# Patient Record
Sex: Male | Born: 1935
Health system: Southern US, Community
[De-identification: ages and names within clinical notes are randomized; demographics above are authoritative.]

## PROBLEM LIST (undated history)

## (undated) DIAGNOSIS — I444 Left anterior fascicular block: Secondary | ICD-10-CM

## (undated) DIAGNOSIS — H409 Unspecified glaucoma: Secondary | ICD-10-CM

## (undated) DIAGNOSIS — I509 Heart failure, unspecified: Secondary | ICD-10-CM

## (undated) DIAGNOSIS — E785 Hyperlipidemia, unspecified: Secondary | ICD-10-CM

## (undated) DIAGNOSIS — I4510 Unspecified right bundle-branch block: Secondary | ICD-10-CM

## (undated) DIAGNOSIS — K219 Gastro-esophageal reflux disease without esophagitis: Secondary | ICD-10-CM

## (undated) DIAGNOSIS — J449 Chronic obstructive pulmonary disease, unspecified: Secondary | ICD-10-CM

## (undated) DIAGNOSIS — I4891 Unspecified atrial fibrillation: Secondary | ICD-10-CM

## (undated) DIAGNOSIS — E039 Hypothyroidism, unspecified: Secondary | ICD-10-CM

## (undated) DIAGNOSIS — I499 Cardiac arrhythmia, unspecified: Secondary | ICD-10-CM

## (undated) DIAGNOSIS — I10 Essential (primary) hypertension: Secondary | ICD-10-CM

## (undated) DIAGNOSIS — D472 Monoclonal gammopathy: Secondary | ICD-10-CM

## (undated) DIAGNOSIS — D649 Anemia, unspecified: Secondary | ICD-10-CM

## (undated) HISTORY — PX: OTHER SURGICAL HISTORY: SHX169

## (undated) HISTORY — DX: Heart failure, unspecified: I50.9

## (undated) HISTORY — PX: TONSILLECTOMY: SUR1361

---

## 2007-12-13 ENCOUNTER — Ambulatory Visit: Payer: Self-pay | Admitting: Family Medicine

## 2007-12-26 ENCOUNTER — Ambulatory Visit: Payer: Self-pay | Admitting: Family Medicine

## 2008-01-05 ENCOUNTER — Ambulatory Visit: Payer: Self-pay | Admitting: Internal Medicine

## 2008-01-15 ENCOUNTER — Ambulatory Visit: Payer: Self-pay | Admitting: Internal Medicine

## 2008-01-23 ENCOUNTER — Ambulatory Visit: Payer: Self-pay | Admitting: Urology

## 2008-01-23 ENCOUNTER — Other Ambulatory Visit: Payer: Self-pay

## 2008-02-06 ENCOUNTER — Ambulatory Visit: Payer: Self-pay | Admitting: Urology

## 2008-02-14 ENCOUNTER — Ambulatory Visit: Payer: Self-pay | Admitting: Internal Medicine

## 2008-03-16 ENCOUNTER — Ambulatory Visit: Payer: Self-pay | Admitting: Internal Medicine

## 2008-04-05 ENCOUNTER — Ambulatory Visit: Payer: Self-pay | Admitting: Urology

## 2008-04-16 ENCOUNTER — Ambulatory Visit: Payer: Self-pay | Admitting: Internal Medicine

## 2008-04-19 ENCOUNTER — Ambulatory Visit: Payer: Self-pay | Admitting: Internal Medicine

## 2008-05-16 ENCOUNTER — Ambulatory Visit: Payer: Self-pay | Admitting: Internal Medicine

## 2008-07-16 ENCOUNTER — Ambulatory Visit: Payer: Self-pay | Admitting: Internal Medicine

## 2008-07-24 ENCOUNTER — Ambulatory Visit: Payer: Self-pay | Admitting: Internal Medicine

## 2008-07-26 ENCOUNTER — Ambulatory Visit: Payer: Self-pay | Admitting: Urology

## 2008-08-16 ENCOUNTER — Ambulatory Visit: Payer: Self-pay | Admitting: Internal Medicine

## 2008-11-14 ENCOUNTER — Ambulatory Visit: Payer: Self-pay | Admitting: Internal Medicine

## 2008-11-21 ENCOUNTER — Ambulatory Visit: Payer: Self-pay | Admitting: Internal Medicine

## 2008-12-14 ENCOUNTER — Ambulatory Visit: Payer: Self-pay | Admitting: Internal Medicine

## 2009-02-03 ENCOUNTER — Ambulatory Visit: Payer: Self-pay | Admitting: Urology

## 2009-03-16 ENCOUNTER — Ambulatory Visit: Payer: Self-pay | Admitting: Internal Medicine

## 2009-03-26 ENCOUNTER — Ambulatory Visit: Payer: Self-pay | Admitting: Internal Medicine

## 2009-04-16 ENCOUNTER — Ambulatory Visit: Payer: Self-pay | Admitting: Internal Medicine

## 2009-09-16 ENCOUNTER — Ambulatory Visit: Payer: Self-pay | Admitting: Internal Medicine

## 2009-09-24 ENCOUNTER — Ambulatory Visit: Payer: Self-pay | Admitting: Internal Medicine

## 2009-10-14 ENCOUNTER — Ambulatory Visit: Payer: Self-pay | Admitting: Internal Medicine

## 2009-11-14 ENCOUNTER — Ambulatory Visit: Payer: Self-pay | Admitting: Internal Medicine

## 2010-02-11 ENCOUNTER — Ambulatory Visit: Payer: Self-pay | Admitting: Urology

## 2010-02-13 ENCOUNTER — Ambulatory Visit: Payer: Self-pay | Admitting: Internal Medicine

## 2010-03-05 ENCOUNTER — Ambulatory Visit: Payer: Self-pay | Admitting: Internal Medicine

## 2010-03-16 ENCOUNTER — Ambulatory Visit: Payer: Self-pay | Admitting: Internal Medicine

## 2010-09-08 ENCOUNTER — Ambulatory Visit: Payer: Self-pay | Admitting: Internal Medicine

## 2010-09-16 ENCOUNTER — Ambulatory Visit: Payer: Self-pay | Admitting: Internal Medicine

## 2010-10-15 ENCOUNTER — Ambulatory Visit: Payer: Self-pay | Admitting: Internal Medicine

## 2011-01-12 ENCOUNTER — Ambulatory Visit: Payer: Self-pay | Admitting: Cardiology

## 2011-01-26 ENCOUNTER — Ambulatory Visit: Payer: Self-pay | Admitting: Cardiology

## 2011-02-24 ENCOUNTER — Ambulatory Visit: Payer: Self-pay | Admitting: Urology

## 2011-03-09 ENCOUNTER — Ambulatory Visit: Payer: Self-pay | Admitting: Internal Medicine

## 2011-03-17 ENCOUNTER — Ambulatory Visit: Payer: Self-pay | Admitting: Internal Medicine

## 2011-09-30 ENCOUNTER — Ambulatory Visit: Payer: Self-pay | Admitting: Internal Medicine

## 2011-09-30 LAB — CBC CANCER CENTER
Eosinophil #: 1.1 x10 3/mm — ABNORMAL HIGH (ref 0.0–0.7)
Eosinophil %: 21.4 %
Lymphocyte #: 1.6 x10 3/mm (ref 1.0–3.6)
Lymphocyte %: 33.1 %
MCHC: 33.8 g/dL (ref 32.0–36.0)
MCV: 98 fL (ref 80–100)
Monocyte %: 8.6 %
Neutrophil %: 36.1 %
Platelet: 223 x10 3/mm (ref 150–440)
RBC: 3.93 10*6/uL — ABNORMAL LOW (ref 4.40–5.90)
RDW: 13.9 % (ref 11.5–14.5)
WBC: 4.9 x10 3/mm (ref 3.8–10.6)

## 2011-09-30 LAB — COMPREHENSIVE METABOLIC PANEL
Albumin: 3.8 g/dL (ref 3.4–5.0)
Anion Gap: 6 — ABNORMAL LOW (ref 7–16)
BUN: 13 mg/dL (ref 7–18)
Bilirubin,Total: 0.6 mg/dL (ref 0.2–1.0)
Co2: 33 mmol/L — ABNORMAL HIGH (ref 21–32)
Creatinine: 1.04 mg/dL (ref 0.60–1.30)
EGFR (African American): >60
Glucose: 92 mg/dL (ref 65–99)
Potassium: 4.3 mmol/L (ref 3.5–5.1)
SGOT(AST): 22 U/L (ref 15–37)
Sodium: 139 mmol/L (ref 136–145)
Total Protein: 8.3 g/dL — ABNORMAL HIGH (ref 6.4–8.2)

## 2011-10-04 LAB — PROT IMMUNOELECTROPHORES(ARMC)

## 2011-10-11 LAB — PROT IMMUNOELECT,UR-24HR

## 2011-10-15 ENCOUNTER — Ambulatory Visit: Payer: Self-pay | Admitting: Internal Medicine

## 2012-02-28 ENCOUNTER — Ambulatory Visit: Payer: Self-pay | Admitting: Urology

## 2012-04-13 ENCOUNTER — Ambulatory Visit: Payer: Self-pay | Admitting: Oncology

## 2012-04-13 LAB — BASIC METABOLIC PANEL
BUN: 16 mg/dL (ref 7–18)
Calcium, Total: 9 mg/dL (ref 8.5–10.1)
Chloride: 99 mmol/L (ref 98–107)
Co2: 32 mmol/L (ref 21–32)
Creatinine: 1.14 mg/dL (ref 0.60–1.30)
EGFR (African American): >60
Glucose: 132 mg/dL — ABNORMAL HIGH (ref 65–99)
Osmolality: 279 (ref 275–301)
Potassium: 4 mmol/L (ref 3.5–5.1)
Sodium: 138 mmol/L (ref 136–145)

## 2012-04-13 LAB — CBC CANCER CENTER
Basophil %: 0.7 %
HCT: 41.8 % (ref 40.0–52.0)
HGB: 13.7 g/dL (ref 13.0–18.0)
Lymphocyte %: 40.3 %
Monocyte #: 0.3 x10 3/mm (ref 0.2–1.0)
Monocyte %: 6.5 %
Neutrophil #: 1.6 x10 3/mm (ref 1.4–6.5)
RBC: 4.22 10*6/uL — ABNORMAL LOW (ref 4.40–5.90)
WBC: 4.4 x10 3/mm (ref 3.8–10.6)

## 2012-04-16 ENCOUNTER — Ambulatory Visit: Payer: Self-pay | Admitting: Oncology

## 2012-05-16 ENCOUNTER — Ambulatory Visit: Payer: Self-pay | Admitting: Oncology

## 2012-10-10 ENCOUNTER — Ambulatory Visit: Payer: Self-pay | Admitting: Oncology

## 2012-10-12 LAB — BASIC METABOLIC PANEL
Calcium, Total: 9.1 mg/dL (ref 8.5–10.1)
Creatinine: 1.08 mg/dL (ref 0.60–1.30)
Glucose: 120 mg/dL — ABNORMAL HIGH (ref 65–99)
Osmolality: 278 (ref 275–301)
Sodium: 139 mmol/L (ref 136–145)

## 2012-10-12 LAB — CBC CANCER CENTER
Basophil #: 0 x10 3/mm (ref 0.0–0.1)
Eosinophil %: 15.1 %
HCT: 40.7 % (ref 40.0–52.0)
HGB: 13.9 g/dL (ref 13.0–18.0)
MCH: 33 pg (ref 26.0–34.0)
MCHC: 34.1 g/dL (ref 32.0–36.0)
MCV: 97 fL (ref 80–100)
Monocyte #: 0.3 x10 3/mm (ref 0.2–1.0)
Monocyte %: 5.9 %
Platelet: 242 x10 3/mm (ref 150–440)
WBC: 5.1 x10 3/mm (ref 3.8–10.6)

## 2012-10-13 LAB — URINE IEP, RANDOM

## 2012-10-14 ENCOUNTER — Ambulatory Visit: Payer: Self-pay | Admitting: Oncology

## 2012-10-16 LAB — KAPPA/LAMBDA FREE LIGHT CHAINS (ARMC)

## 2012-10-16 LAB — PROT IMMUNOELECTROPHORES(ARMC)

## 2013-02-28 ENCOUNTER — Ambulatory Visit: Payer: Self-pay | Admitting: Urology

## 2013-04-12 ENCOUNTER — Ambulatory Visit: Payer: Self-pay | Admitting: Oncology

## 2013-04-12 LAB — CBC CANCER CENTER
Basophil #: 0.1 x10 3/mm (ref 0.0–0.1)
Eosinophil #: 0.7 x10 3/mm (ref 0.0–0.7)
HCT: 39.4 % — ABNORMAL LOW (ref 40.0–52.0)
HGB: 13.7 g/dL (ref 13.0–18.0)
MCH: 33.7 pg (ref 26.0–34.0)
MCHC: 34.8 g/dL (ref 32.0–36.0)
Monocyte %: 6.4 %
Neutrophil #: 1.7 x10 3/mm (ref 1.4–6.5)
Neutrophil %: 35.6 %
RBC: 4.07 10*6/uL — ABNORMAL LOW (ref 4.40–5.90)
RDW: 12.7 % (ref 11.5–14.5)

## 2013-04-12 LAB — BASIC METABOLIC PANEL
Anion Gap: 5 — ABNORMAL LOW (ref 7–16)
Calcium, Total: 9.4 mg/dL (ref 8.5–10.1)
Chloride: 101 mmol/L (ref 98–107)
Co2: 32 mmol/L (ref 21–32)
Creatinine: 1.14 mg/dL (ref 0.60–1.30)
EGFR (African American): >60
EGFR (Non-African Amer.): 59 — ABNORMAL LOW
Glucose: 102 mg/dL — ABNORMAL HIGH (ref 65–99)
Osmolality: 276 (ref 275–301)
Sodium: 138 mmol/L (ref 136–145)

## 2013-04-13 LAB — URINE IEP, RANDOM

## 2013-04-13 LAB — KAPPA/LAMBDA FREE LIGHT CHAINS (ARMC)

## 2013-04-13 LAB — PROT IMMUNOELECTROPHORES(ARMC)

## 2013-04-16 ENCOUNTER — Ambulatory Visit: Payer: Self-pay | Admitting: Oncology

## 2013-05-16 ENCOUNTER — Ambulatory Visit: Payer: Self-pay | Admitting: Oncology

## 2013-10-18 ENCOUNTER — Ambulatory Visit: Payer: Self-pay | Admitting: Oncology

## 2013-10-18 LAB — CBC CANCER CENTER
BASOS PCT: 0.3 %
Basophil #: 0 x10 3/mm (ref 0.0–0.1)
Eosinophil #: 0.9 x10 3/mm — ABNORMAL HIGH (ref 0.0–0.7)
Eosinophil %: 18.5 %
HCT: 42 % (ref 40.0–52.0)
HGB: 13.9 g/dL (ref 13.0–18.0)
Lymphocyte #: 1.9 x10 3/mm (ref 1.0–3.6)
Lymphocyte %: 40.5 %
MCH: 32.8 pg (ref 26.0–34.0)
MCHC: 33.2 g/dL (ref 32.0–36.0)
MCV: 99 fL (ref 80–100)
MONOS PCT: 6.7 %
Monocyte #: 0.3 x10 3/mm (ref 0.2–1.0)
NEUTROS ABS: 1.6 x10 3/mm (ref 1.4–6.5)
Neutrophil %: 34 %
PLATELETS: 192 x10 3/mm (ref 150–440)
RBC: 4.25 10*6/uL — ABNORMAL LOW (ref 4.40–5.90)
RDW: 12.4 % (ref 11.5–14.5)
WBC: 4.7 x10 3/mm (ref 3.8–10.6)

## 2013-10-18 LAB — BASIC METABOLIC PANEL
ANION GAP: 5 — AB (ref 7–16)
BUN: 17 mg/dL (ref 7–18)
CO2: 31 mmol/L (ref 21–32)
CREATININE: 1.04 mg/dL (ref 0.60–1.30)
Calcium, Total: 8.3 mg/dL — ABNORMAL LOW (ref 8.5–10.1)
Chloride: 102 mmol/L (ref 98–107)
EGFR (Non-African Amer.): >60
GLUCOSE: 104 mg/dL — AB (ref 65–99)
Osmolality: 278 (ref 275–301)
Potassium: 4.4 mmol/L (ref 3.5–5.1)
SODIUM: 138 mmol/L (ref 136–145)

## 2013-10-22 LAB — KAPPA/LAMBDA FREE LIGHT CHAINS (ARMC)

## 2013-10-22 LAB — URINE IEP, RANDOM

## 2013-10-22 LAB — PROT IMMUNOELECTROPHORES(ARMC)

## 2013-11-14 ENCOUNTER — Ambulatory Visit: Payer: Self-pay | Admitting: Oncology

## 2014-05-02 ENCOUNTER — Ambulatory Visit: Payer: Self-pay | Admitting: Oncology

## 2014-05-02 LAB — BASIC METABOLIC PANEL
Anion Gap: 8 (ref 7–16)
BUN: 17 mg/dL (ref 7–18)
Calcium, Total: 9.2 mg/dL (ref 8.5–10.1)
Chloride: 98 mmol/L (ref 98–107)
Co2: 31 mmol/L (ref 21–32)
Creatinine: 1.17 mg/dL (ref 0.60–1.30)
EGFR (African American): >60
EGFR (Non-African Amer.): 57 — ABNORMAL LOW
GLUCOSE: 108 mg/dL — AB (ref 65–99)
Osmolality: 276 (ref 275–301)
POTASSIUM: 4.2 mmol/L (ref 3.5–5.1)
SODIUM: 137 mmol/L (ref 136–145)

## 2014-05-02 LAB — CBC CANCER CENTER
Basophil #: 0 x10 3/mm (ref 0.0–0.1)
Basophil %: 0.8 %
EOS ABS: 0.6 x10 3/mm (ref 0.0–0.7)
EOS PCT: 14.6 %
HCT: 43.6 % (ref 40.0–52.0)
HGB: 14.7 g/dL (ref 13.0–18.0)
Lymphocyte #: 1.9 x10 3/mm (ref 1.0–3.6)
Lymphocyte %: 44.1 %
MCH: 33.9 pg (ref 26.0–34.0)
MCHC: 33.8 g/dL (ref 32.0–36.0)
MCV: 101 fL — AB (ref 80–100)
MONO ABS: 0.3 x10 3/mm (ref 0.2–1.0)
MONOS PCT: 7.4 %
NEUTROS PCT: 33.1 %
Neutrophil #: 1.4 x10 3/mm (ref 1.4–6.5)
Platelet: 202 x10 3/mm (ref 150–440)
RBC: 4.34 10*6/uL — ABNORMAL LOW (ref 4.40–5.90)
RDW: 12.4 % (ref 11.5–14.5)
WBC: 4.4 x10 3/mm (ref 3.8–10.6)

## 2014-05-04 LAB — URINE IEP, RANDOM

## 2014-05-07 LAB — PROT IMMUNOELECTROPHORES(ARMC)

## 2014-05-07 LAB — KAPPA/LAMBDA FREE LIGHT CHAINS (ARMC)

## 2014-05-16 ENCOUNTER — Ambulatory Visit: Payer: Self-pay | Admitting: Oncology

## 2014-08-19 DIAGNOSIS — J449 Chronic obstructive pulmonary disease, unspecified: Secondary | ICD-10-CM | POA: Diagnosis not present

## 2014-08-19 DIAGNOSIS — E782 Mixed hyperlipidemia: Secondary | ICD-10-CM | POA: Diagnosis not present

## 2014-08-19 DIAGNOSIS — I10 Essential (primary) hypertension: Secondary | ICD-10-CM | POA: Diagnosis not present

## 2014-08-19 DIAGNOSIS — D472 Monoclonal gammopathy: Secondary | ICD-10-CM | POA: Diagnosis not present

## 2014-09-04 DIAGNOSIS — I4510 Unspecified right bundle-branch block: Secondary | ICD-10-CM | POA: Diagnosis not present

## 2014-09-04 DIAGNOSIS — I10 Essential (primary) hypertension: Secondary | ICD-10-CM | POA: Diagnosis not present

## 2014-09-04 DIAGNOSIS — E119 Type 2 diabetes mellitus without complications: Secondary | ICD-10-CM | POA: Diagnosis not present

## 2014-09-04 DIAGNOSIS — E782 Mixed hyperlipidemia: Secondary | ICD-10-CM | POA: Diagnosis not present

## 2014-09-23 DIAGNOSIS — H4011X1 Primary open-angle glaucoma, mild stage: Secondary | ICD-10-CM | POA: Diagnosis not present

## 2014-11-07 ENCOUNTER — Ambulatory Visit
Admit: 2014-11-07 | Disposition: A | Discharge status: Home / Self Care | Payer: Self-pay | Attending: Oncology | Admitting: Oncology

## 2014-11-07 DIAGNOSIS — J449 Chronic obstructive pulmonary disease, unspecified: Secondary | ICD-10-CM | POA: Diagnosis not present

## 2014-11-07 DIAGNOSIS — K219 Gastro-esophageal reflux disease without esophagitis: Secondary | ICD-10-CM | POA: Diagnosis not present

## 2014-11-07 DIAGNOSIS — Z79899 Other long term (current) drug therapy: Secondary | ICD-10-CM | POA: Diagnosis not present

## 2014-11-07 DIAGNOSIS — N400 Enlarged prostate without lower urinary tract symptoms: Secondary | ICD-10-CM | POA: Diagnosis not present

## 2014-11-07 DIAGNOSIS — I10 Essential (primary) hypertension: Secondary | ICD-10-CM | POA: Diagnosis not present

## 2014-11-07 DIAGNOSIS — E785 Hyperlipidemia, unspecified: Secondary | ICD-10-CM | POA: Diagnosis not present

## 2014-11-07 DIAGNOSIS — D3002 Benign neoplasm of left kidney: Secondary | ICD-10-CM | POA: Diagnosis not present

## 2014-11-07 DIAGNOSIS — D472 Monoclonal gammopathy: Secondary | ICD-10-CM | POA: Diagnosis not present

## 2014-11-07 LAB — BASIC METABOLIC PANEL
ANION GAP: 6 — AB (ref 7–16)
BUN: 17 mg/dL
CO2: 31 mmol/L
Calcium, Total: 9.3 mg/dL
Chloride: 100 mmol/L — ABNORMAL LOW
Creatinine: 0.81 mg/dL
EGFR (African American): >60
EGFR (Non-African Amer.): >60
GLUCOSE: 97 mg/dL
Potassium: 4.4 mmol/L
SODIUM: 137 mmol/L

## 2014-11-07 LAB — CBC CANCER CENTER
BASOS ABS: 0.1 x10 3/mm (ref 0.0–0.1)
Basophil %: 2.7 %
EOS PCT: 10.9 %
Eosinophil #: 0.5 x10 3/mm (ref 0.0–0.7)
HCT: 42.2 % (ref 40.0–52.0)
HGB: 14.2 g/dL (ref 13.0–18.0)
LYMPHS ABS: 1.6 x10 3/mm (ref 1.0–3.6)
Lymphocyte %: 39 %
MCH: 33.8 pg (ref 26.0–34.0)
MCHC: 33.7 g/dL (ref 32.0–36.0)
MCV: 100 fL (ref 80–100)
MONO ABS: 0.3 x10 3/mm (ref 0.2–1.0)
Monocyte %: 8.2 %
Neutrophil #: 1.7 x10 3/mm (ref 1.4–6.5)
Neutrophil %: 39.2 %
Platelet: 188 x10 3/mm (ref 150–440)
RBC: 4.2 10*6/uL — ABNORMAL LOW (ref 4.40–5.90)
RDW: 12.3 % (ref 11.5–14.5)
WBC: 4.2 x10 3/mm (ref 3.8–10.6)

## 2014-11-11 LAB — PROT IMMUNOELECTROPHORES(ARMC)

## 2014-11-11 LAB — URINE IEP, RANDOM

## 2014-11-11 LAB — KAPPA/LAMBDA FREE LIGHT CHAINS (ARMC)

## 2014-11-14 DIAGNOSIS — D472 Monoclonal gammopathy: Secondary | ICD-10-CM | POA: Diagnosis not present

## 2014-11-14 DIAGNOSIS — Z79899 Other long term (current) drug therapy: Secondary | ICD-10-CM | POA: Diagnosis not present

## 2014-11-14 DIAGNOSIS — I10 Essential (primary) hypertension: Secondary | ICD-10-CM | POA: Diagnosis not present

## 2014-11-14 DIAGNOSIS — E785 Hyperlipidemia, unspecified: Secondary | ICD-10-CM | POA: Diagnosis not present

## 2014-11-14 DIAGNOSIS — K219 Gastro-esophageal reflux disease without esophagitis: Secondary | ICD-10-CM | POA: Diagnosis not present

## 2014-11-14 DIAGNOSIS — N400 Enlarged prostate without lower urinary tract symptoms: Secondary | ICD-10-CM | POA: Diagnosis not present

## 2014-11-14 DIAGNOSIS — D3002 Benign neoplasm of left kidney: Secondary | ICD-10-CM | POA: Diagnosis not present

## 2014-11-14 DIAGNOSIS — J449 Chronic obstructive pulmonary disease, unspecified: Secondary | ICD-10-CM | POA: Diagnosis not present

## 2014-11-15 ENCOUNTER — Ambulatory Visit
Admit: 2014-11-15 | Disposition: A | Discharge status: Home / Self Care | Payer: Self-pay | Attending: Oncology | Admitting: Oncology

## 2014-12-18 DIAGNOSIS — E782 Mixed hyperlipidemia: Secondary | ICD-10-CM | POA: Diagnosis not present

## 2014-12-18 DIAGNOSIS — I10 Essential (primary) hypertension: Secondary | ICD-10-CM | POA: Diagnosis not present

## 2014-12-18 DIAGNOSIS — E039 Hypothyroidism, unspecified: Secondary | ICD-10-CM | POA: Diagnosis not present

## 2014-12-18 DIAGNOSIS — D472 Monoclonal gammopathy: Secondary | ICD-10-CM | POA: Diagnosis not present

## 2015-01-22 DIAGNOSIS — H4011X1 Primary open-angle glaucoma, mild stage: Secondary | ICD-10-CM | POA: Diagnosis not present

## 2015-01-27 DIAGNOSIS — R0602 Shortness of breath: Secondary | ICD-10-CM | POA: Diagnosis not present

## 2015-01-27 DIAGNOSIS — J449 Chronic obstructive pulmonary disease, unspecified: Secondary | ICD-10-CM | POA: Diagnosis not present

## 2015-01-31 DIAGNOSIS — H4011X1 Primary open-angle glaucoma, mild stage: Secondary | ICD-10-CM | POA: Diagnosis not present

## 2015-03-14 DIAGNOSIS — I10 Essential (primary) hypertension: Secondary | ICD-10-CM | POA: Diagnosis not present

## 2015-03-14 DIAGNOSIS — I444 Left anterior fascicular block: Secondary | ICD-10-CM | POA: Diagnosis not present

## 2015-03-14 DIAGNOSIS — E782 Mixed hyperlipidemia: Secondary | ICD-10-CM | POA: Diagnosis not present

## 2015-03-14 DIAGNOSIS — I4510 Unspecified right bundle-branch block: Secondary | ICD-10-CM | POA: Diagnosis not present

## 2015-04-17 DIAGNOSIS — E039 Hypothyroidism, unspecified: Secondary | ICD-10-CM | POA: Diagnosis not present

## 2015-04-17 DIAGNOSIS — I10 Essential (primary) hypertension: Secondary | ICD-10-CM | POA: Diagnosis not present

## 2015-04-17 DIAGNOSIS — D638 Anemia in other chronic diseases classified elsewhere: Secondary | ICD-10-CM | POA: Diagnosis not present

## 2015-04-17 DIAGNOSIS — E782 Mixed hyperlipidemia: Secondary | ICD-10-CM | POA: Diagnosis not present

## 2015-04-22 DIAGNOSIS — D649 Anemia, unspecified: Secondary | ICD-10-CM | POA: Diagnosis not present

## 2015-04-22 DIAGNOSIS — D3002 Benign neoplasm of left kidney: Secondary | ICD-10-CM | POA: Diagnosis not present

## 2015-04-22 DIAGNOSIS — N400 Enlarged prostate without lower urinary tract symptoms: Secondary | ICD-10-CM | POA: Diagnosis not present

## 2015-04-22 DIAGNOSIS — I10 Essential (primary) hypertension: Secondary | ICD-10-CM | POA: Diagnosis not present

## 2015-04-22 DIAGNOSIS — E782 Mixed hyperlipidemia: Secondary | ICD-10-CM | POA: Diagnosis not present

## 2015-04-22 DIAGNOSIS — D472 Monoclonal gammopathy: Secondary | ICD-10-CM | POA: Diagnosis not present

## 2015-04-22 DIAGNOSIS — J449 Chronic obstructive pulmonary disease, unspecified: Secondary | ICD-10-CM | POA: Diagnosis not present

## 2015-05-07 ENCOUNTER — Other Ambulatory Visit: Payer: Self-pay | Admitting: *Deleted

## 2015-05-07 ENCOUNTER — Other Ambulatory Visit: Payer: Self-pay | Admitting: Oncology

## 2015-05-07 DIAGNOSIS — D472 Monoclonal gammopathy: Secondary | ICD-10-CM

## 2015-05-08 ENCOUNTER — Inpatient Hospital Stay: Payer: Commercial Managed Care - HMO | Attending: Oncology

## 2015-05-08 DIAGNOSIS — D3002 Benign neoplasm of left kidney: Secondary | ICD-10-CM | POA: Insufficient documentation

## 2015-05-08 DIAGNOSIS — D472 Monoclonal gammopathy: Secondary | ICD-10-CM | POA: Insufficient documentation

## 2015-05-08 LAB — BASIC METABOLIC PANEL
Anion gap: 7 (ref 5–15)
BUN: 19 mg/dL (ref 6–20)
CO2: 29 mmol/L (ref 22–32)
Calcium: 9.1 mg/dL (ref 8.9–10.3)
Chloride: 97 mmol/L — ABNORMAL LOW (ref 101–111)
Creatinine, Ser: 0.89 mg/dL (ref 0.61–1.24)
GFR calc Af Amer: >60 mL/min (ref >60)
GLUCOSE: 95 mg/dL (ref 65–99)
POTASSIUM: 4.3 mmol/L (ref 3.5–5.1)
Sodium: 133 mmol/L — ABNORMAL LOW (ref 135–145)

## 2015-05-08 LAB — CBC WITH DIFFERENTIAL/PLATELET
Basophils Absolute: 0 10*3/uL (ref 0–0.1)
Basophils Relative: 0 %
EOS PCT: 13 %
Eosinophils Absolute: 0.7 10*3/uL (ref 0–0.7)
HEMATOCRIT: 41.7 % (ref 40.0–52.0)
Hemoglobin: 14 g/dL (ref 13.0–18.0)
LYMPHS PCT: 38 %
Lymphs Abs: 1.9 10*3/uL (ref 1.0–3.6)
MCH: 32.9 pg (ref 26.0–34.0)
MCHC: 33.6 g/dL (ref 32.0–36.0)
MCV: 98 fL (ref 80.0–100.0)
MONO ABS: 0.4 10*3/uL (ref 0.2–1.0)
Monocytes Relative: 7 %
NEUTROS ABS: 2.1 10*3/uL (ref 1.4–6.5)
Neutrophils Relative %: 42 %
PLATELETS: 190 10*3/uL (ref 150–440)
RBC: 4.25 MIL/uL — ABNORMAL LOW (ref 4.40–5.90)
RDW: 13.1 % (ref 11.5–14.5)
WBC: 5.1 10*3/uL (ref 3.8–10.6)

## 2015-05-09 LAB — PROTEIN ELECTROPHORESIS, SERUM
A/G RATIO SPE: 0.9 (ref 0.7–1.7)
ALPHA-2-GLOBULIN: 0.9 g/dL (ref 0.4–1.0)
Albumin ELP: 3.6 g/dL (ref 2.9–4.4)
Alpha-1-Globulin: 0.2 g/dL (ref 0.0–0.4)
Beta Globulin: 1 g/dL (ref 0.7–1.3)
GLOBULIN, TOTAL: 3.9 g/dL (ref 2.2–3.9)
Gamma Globulin: 1.8 g/dL (ref 0.4–1.8)
M-Spike, %: 0.7 g/dL — ABNORMAL HIGH
TOTAL PROTEIN ELP: 7.5 g/dL (ref 6.0–8.5)

## 2015-05-15 ENCOUNTER — Inpatient Hospital Stay: Payer: Commercial Managed Care - HMO | Admitting: Oncology

## 2015-05-22 ENCOUNTER — Ambulatory Visit: Payer: Commercial Managed Care - HMO | Admitting: Oncology

## 2015-05-22 ENCOUNTER — Inpatient Hospital Stay: Payer: Commercial Managed Care - HMO | Attending: Oncology | Admitting: Oncology

## 2015-05-22 VITALS — BP 149/74 | HR 54 | Temp 97.4°F | Wt 195.3 lb

## 2015-05-22 DIAGNOSIS — I10 Essential (primary) hypertension: Secondary | ICD-10-CM | POA: Insufficient documentation

## 2015-05-22 DIAGNOSIS — J449 Chronic obstructive pulmonary disease, unspecified: Secondary | ICD-10-CM | POA: Diagnosis not present

## 2015-05-22 DIAGNOSIS — E785 Hyperlipidemia, unspecified: Secondary | ICD-10-CM | POA: Diagnosis not present

## 2015-05-22 DIAGNOSIS — D3002 Benign neoplasm of left kidney: Secondary | ICD-10-CM | POA: Diagnosis not present

## 2015-05-22 DIAGNOSIS — Z79899 Other long term (current) drug therapy: Secondary | ICD-10-CM

## 2015-05-22 DIAGNOSIS — K219 Gastro-esophageal reflux disease without esophagitis: Secondary | ICD-10-CM | POA: Insufficient documentation

## 2015-05-22 DIAGNOSIS — Z7982 Long term (current) use of aspirin: Secondary | ICD-10-CM | POA: Insufficient documentation

## 2015-05-22 DIAGNOSIS — D472 Monoclonal gammopathy: Secondary | ICD-10-CM | POA: Insufficient documentation

## 2015-05-22 NOTE — Progress Notes (Signed)
Patient has no concerns today. 

## 2015-06-06 NOTE — Progress Notes (Signed)
[facility]  [phone] [phone]  ID: Gabriel Delacruz OB: 12/23/1927  MR#: [id number]  CSN#:[id number]  Patient Care Team: Miriam K McLaughlin, MD as PCP - General (Physician Assistant)  CHIEF COMPLAINT:  Chief Complaint  Patient presents with  . Follow-up    INTERVAL HISTORY: Patient returns to clinic today for repeat laboratory work and further evaluation.  He continues to feel well and is asymptomatic. He denies any recent fevers or illnesses.  He denies any weakness or fatigue.  He denies any pain.  He has no neurologic complaints.  He has a good appetite and denies weight loss.  He has no chest pain or shortness of breath.  He denies any nausea, vomiting, constipation, or diarrhea.  He has no urinary complaints.  Patient offers no specific complaints today.    REVIEW OF SYSTEMS:   Review of Systems  Constitutional: Negative.   Respiratory: Negative.   Cardiovascular: Negative.   Musculoskeletal: Negative.   Neurological: Negative.     As per HPI. Otherwise, a complete review of systems is negatve.  PAST MEDICAL HISTORY: Glaucoma, hypertension, hyperlipidemia, BPH, perirectal abscess, COPD, GERD  PAST SURGICAL HISTORY: left renal oncocytoma s/p CT-guided percutaneous renal cryoablation, hypothyroidism.  Tonsillectomy, cervical and lumbar disc surgery.  FAMILY HISTORY: Reviewed and unchanged. No reported history of malignancy or chronic disease.      ADVANCED DIRECTIVES:    HEALTH MAINTENANCE: Social History  Substance Use Topics  . Smoking status: Not on file  . Smokeless tobacco: Not on file  . Alcohol Use: Not on file     Colonoscopy:  PAP:  Bone density:  Lipid panel:  Allergies not on file  Current Outpatient Prescriptions  Medication Sig Dispense Refill  . acetaminophen (TYLENOL) 325 MG tablet Take 650 mg by mouth.    . albuterol (PROAIR HFA) 108 (90 BASE) MCG/ACT inhaler Inhale into the lungs.    .  amLODipine (NORVASC) 5 MG tablet Take 5 mg by mouth.    . aspirin EC 81 MG tablet Take 81 mg by mouth.    . dorzolamide-timolol (COSOPT) 22.3-6.8 MG/ML ophthalmic solution 1 drop Two (2) times a day.    . Fluticasone-Salmeterol (ADVAIR DISKUS) 250-50 MCG/DOSE AEPB Inhale into the lungs.    . furosemide (LASIX) 20 MG tablet Take 20 mg by mouth.    . losartan-hydrochlorothiazide (HYZAAR) 100-12.5 MG tablet Take by mouth.    . multivitamin-iron-minerals-folic acid (CENTRUM) chewable tablet Chew by mouth.    . nebivolol (BYSTOLIC) 10 MG tablet Take 10 mg by mouth.    . potassium chloride (K-DUR) 10 MEQ tablet Take by mouth.    . Travoprost, BAK Free, (TRAVATAN Z) 0.004 % SOLN ophthalmic solution Apply to eye.     No current facility-administered medications for this visit.    OBJECTIVE: Filed Vitals:   05/22/15 1037  BP: 149/74  Pulse: 54  Temp: 97.4 F (36.3 C)     There is no height on file to calculate BMI.    ECOG FS:0 - Asymptomatic  General: Well-developed, well-nourished, no acute distress. Eyes: Pink conjunctiva, anicteric sclera. Lungs: Clear to auscultation bilaterally. Heart: Regular rate and rhythm. No rubs, murmurs, or gallops. Abdomen: Soft, nontender, nondistended. No organomegaly noted, normoactive bowel sounds. Musculoskeletal: No edema, cyanosis, or clubbing. Neuro: Alert, answering all questions appropriately. Cranial nerves grossly intact. Skin: No rashes or petechiae noted. Psych: Normal affect.   LAB RESULTS:  Lab Results  Component Value Date   NA 133* 05/08/2015     K 4.3 05/08/2015   CL 97* 05/08/2015   CO2 29 05/08/2015   GLUCOSE 95 05/08/2015   BUN 19 05/08/2015   CREATININE 0.89 05/08/2015   CALCIUM 9.1 05/08/2015   PROT 8.3* 09/30/2011   ALBUMIN 3.8 09/30/2011   AST 22 09/30/2011   ALT 31 09/30/2011   ALKPHOS 94 09/30/2011   BILITOT 0.6 09/30/2011   GFRNONAA >60 05/08/2015   GFRAA >60 05/08/2015    Lab Results  Component Value Date    WBC 5.1 05/08/2015   NEUTROABS 2.1 05/08/2015   HGB 14.0 05/08/2015   HCT 41.7 05/08/2015   MCV 98.0 05/08/2015   PLT 190 05/08/2015     STUDIES: No results found.  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  PLAN:    1. MGUS: Patient's M spike has been essentially unchanged at~ 0.7 for nearly 3 years, today's result is 0.7.  Bone marrow biopsy on October 23, 2009 revealed a plasma cell dyscrasia with 10% abnormal plasma cells.  Patient has no evidence of endorgan damage and is asymptomatic.  No intervention is needed at this time, continue simple observation.  Return to clinic in 6 months with repeat laboratory work and further evaluation.   2.  Left renal Oncocytoma: Followup and treatment per Dr. Jacqlyn Larsen 3.  Hypertension: Patient's blood pressure is elevated, but improved today. Continue current medications.  Patient expressed understanding and was in agreement with this plan. He also understands that He can call clinic at any time with any questions, concerns, or complaints.    Lloyd Huger, MD   06/06/2015 5:31 PM

## 2015-06-17 DIAGNOSIS — N281 Cyst of kidney, acquired: Secondary | ICD-10-CM | POA: Diagnosis not present

## 2015-06-17 DIAGNOSIS — N2889 Other specified disorders of kidney and ureter: Secondary | ICD-10-CM | POA: Diagnosis not present

## 2015-06-24 DIAGNOSIS — N200 Calculus of kidney: Secondary | ICD-10-CM | POA: Diagnosis not present

## 2015-06-24 DIAGNOSIS — N138 Other obstructive and reflux uropathy: Secondary | ICD-10-CM | POA: Diagnosis not present

## 2015-06-24 DIAGNOSIS — N2889 Other specified disorders of kidney and ureter: Secondary | ICD-10-CM | POA: Diagnosis not present

## 2015-06-24 DIAGNOSIS — N401 Enlarged prostate with lower urinary tract symptoms: Secondary | ICD-10-CM | POA: Diagnosis not present

## 2015-07-31 DIAGNOSIS — H401131 Primary open-angle glaucoma, bilateral, mild stage: Secondary | ICD-10-CM | POA: Diagnosis not present

## 2015-09-09 DIAGNOSIS — J449 Chronic obstructive pulmonary disease, unspecified: Secondary | ICD-10-CM | POA: Diagnosis not present

## 2015-09-09 DIAGNOSIS — D472 Monoclonal gammopathy: Secondary | ICD-10-CM | POA: Diagnosis not present

## 2015-09-09 DIAGNOSIS — I10 Essential (primary) hypertension: Secondary | ICD-10-CM | POA: Diagnosis not present

## 2015-09-09 DIAGNOSIS — E782 Mixed hyperlipidemia: Secondary | ICD-10-CM | POA: Diagnosis not present

## 2015-09-09 DIAGNOSIS — E038 Other specified hypothyroidism: Secondary | ICD-10-CM | POA: Diagnosis not present

## 2015-09-09 DIAGNOSIS — N400 Enlarged prostate without lower urinary tract symptoms: Secondary | ICD-10-CM | POA: Diagnosis not present

## 2015-09-16 DIAGNOSIS — E782 Mixed hyperlipidemia: Secondary | ICD-10-CM | POA: Diagnosis not present

## 2015-09-16 DIAGNOSIS — I10 Essential (primary) hypertension: Secondary | ICD-10-CM | POA: Diagnosis not present

## 2015-09-16 DIAGNOSIS — J45909 Unspecified asthma, uncomplicated: Secondary | ICD-10-CM | POA: Diagnosis not present

## 2015-09-16 DIAGNOSIS — I444 Left anterior fascicular block: Secondary | ICD-10-CM | POA: Diagnosis not present

## 2015-09-16 DIAGNOSIS — R001 Bradycardia, unspecified: Secondary | ICD-10-CM | POA: Diagnosis not present

## 2015-09-16 DIAGNOSIS — J449 Chronic obstructive pulmonary disease, unspecified: Secondary | ICD-10-CM | POA: Diagnosis not present

## 2015-09-16 DIAGNOSIS — I4510 Unspecified right bundle-branch block: Secondary | ICD-10-CM | POA: Diagnosis not present

## 2015-09-30 DIAGNOSIS — J449 Chronic obstructive pulmonary disease, unspecified: Secondary | ICD-10-CM | POA: Diagnosis not present

## 2015-11-20 ENCOUNTER — Inpatient Hospital Stay: Payer: Commercial Managed Care - HMO | Attending: Oncology

## 2015-12-01 ENCOUNTER — Inpatient Hospital Stay: Payer: Commercial Managed Care - HMO | Admitting: Oncology

## 2015-12-04 ENCOUNTER — Inpatient Hospital Stay: Payer: Commercial Managed Care - HMO | Attending: Oncology

## 2015-12-04 DIAGNOSIS — D472 Monoclonal gammopathy: Secondary | ICD-10-CM | POA: Diagnosis not present

## 2015-12-04 DIAGNOSIS — H409 Unspecified glaucoma: Secondary | ICD-10-CM | POA: Insufficient documentation

## 2015-12-04 DIAGNOSIS — N400 Enlarged prostate without lower urinary tract symptoms: Secondary | ICD-10-CM | POA: Diagnosis not present

## 2015-12-04 DIAGNOSIS — J449 Chronic obstructive pulmonary disease, unspecified: Secondary | ICD-10-CM | POA: Diagnosis not present

## 2015-12-04 DIAGNOSIS — I10 Essential (primary) hypertension: Secondary | ICD-10-CM | POA: Diagnosis not present

## 2015-12-04 DIAGNOSIS — Z79899 Other long term (current) drug therapy: Secondary | ICD-10-CM | POA: Diagnosis not present

## 2015-12-04 DIAGNOSIS — K219 Gastro-esophageal reflux disease without esophagitis: Secondary | ICD-10-CM | POA: Insufficient documentation

## 2015-12-04 DIAGNOSIS — E785 Hyperlipidemia, unspecified: Secondary | ICD-10-CM | POA: Insufficient documentation

## 2015-12-04 LAB — BASIC METABOLIC PANEL
Anion gap: 6 (ref 5–15)
BUN: 19 mg/dL (ref 6–20)
CALCIUM: 8.9 mg/dL (ref 8.9–10.3)
CO2: 28 mmol/L (ref 22–32)
Chloride: 101 mmol/L (ref 101–111)
Creatinine, Ser: 0.8 mg/dL (ref 0.61–1.24)
GFR calc non Af Amer: >60 mL/min (ref >60)
Glucose, Bld: 93 mg/dL (ref 65–99)
Potassium: 4.1 mmol/L (ref 3.5–5.1)
SODIUM: 135 mmol/L (ref 135–145)

## 2015-12-04 LAB — CBC
HCT: 40 % (ref 40.0–52.0)
Hemoglobin: 13.8 g/dL (ref 13.0–18.0)
MCH: 33.3 pg (ref 26.0–34.0)
MCHC: 34.4 g/dL (ref 32.0–36.0)
MCV: 96.8 fL (ref 80.0–100.0)
PLATELETS: 175 10*3/uL (ref 150–440)
RBC: 4.13 MIL/uL — ABNORMAL LOW (ref 4.40–5.90)
RDW: 12.4 % (ref 11.5–14.5)
WBC: 4.2 10*3/uL (ref 3.8–10.6)

## 2015-12-05 LAB — IMMUNOFIXATION ELECTROPHORESIS
IGG (IMMUNOGLOBIN G), SERUM: 1609 mg/dL — AB (ref 700–1600)
IgA: 224 mg/dL (ref 61–437)
IgM, Serum: 72 mg/dL (ref 15–143)
TOTAL PROTEIN ELP: 7.3 g/dL (ref 6.0–8.5)

## 2015-12-05 LAB — KAPPA/LAMBDA LIGHT CHAINS
KAPPA FREE LGHT CHN: 30.58 mg/L — AB (ref 3.30–19.40)
Kappa, lambda light chain ratio: 0.55 (ref 0.26–1.65)
Lambda free light chains: 55.5 mg/L — ABNORMAL HIGH (ref 5.71–26.30)

## 2015-12-05 LAB — PROTEIN ELECTRO, RANDOM URINE
ALBUMIN ELP UR: 23.4 %
ALPHA-2-GLOBULIN, U: 17.9 %
Alpha-1-Globulin, U: 2.6 %
BETA GLOBULIN, U: 19.6 %
Gamma Globulin, U: 36.5 %
M Component, Ur: 23.4 % — ABNORMAL HIGH
TOTAL PROTEIN, URINE-UPE24: 18.7 mg/dL

## 2015-12-05 LAB — PROTEIN ELECTROPHORESIS, SERUM
A/G Ratio: 1 (ref 0.7–1.7)
Albumin ELP: 3.6 g/dL (ref 2.9–4.4)
Alpha-1-Globulin: 0.2 g/dL (ref 0.0–0.4)
Alpha-2-Globulin: 0.9 g/dL (ref 0.4–1.0)
Beta Globulin: 1.1 g/dL (ref 0.7–1.3)
GLOBULIN, TOTAL: 3.7 g/dL (ref 2.2–3.9)
Gamma Globulin: 1.6 g/dL (ref 0.4–1.8)
M-Spike, %: 0.8 g/dL — ABNORMAL HIGH
TOTAL PROTEIN ELP: 7.3 g/dL (ref 6.0–8.5)

## 2015-12-11 ENCOUNTER — Inpatient Hospital Stay (HOSPITAL_BASED_OUTPATIENT_CLINIC_OR_DEPARTMENT_OTHER): Payer: Commercial Managed Care - HMO | Admitting: Oncology

## 2015-12-11 VITALS — BP 150/67 | HR 52 | Temp 97.5°F | Resp 16 | Wt 193.8 lb

## 2015-12-11 DIAGNOSIS — Z79899 Other long term (current) drug therapy: Secondary | ICD-10-CM

## 2015-12-11 DIAGNOSIS — J449 Chronic obstructive pulmonary disease, unspecified: Secondary | ICD-10-CM | POA: Diagnosis not present

## 2015-12-11 DIAGNOSIS — K219 Gastro-esophageal reflux disease without esophagitis: Secondary | ICD-10-CM

## 2015-12-11 DIAGNOSIS — I10 Essential (primary) hypertension: Secondary | ICD-10-CM

## 2015-12-11 DIAGNOSIS — H409 Unspecified glaucoma: Secondary | ICD-10-CM

## 2015-12-11 DIAGNOSIS — N400 Enlarged prostate without lower urinary tract symptoms: Secondary | ICD-10-CM

## 2015-12-11 DIAGNOSIS — E785 Hyperlipidemia, unspecified: Secondary | ICD-10-CM

## 2015-12-11 DIAGNOSIS — D472 Monoclonal gammopathy: Secondary | ICD-10-CM | POA: Diagnosis not present

## 2015-12-11 NOTE — Progress Notes (Signed)
Patient does not offer any problems today.  

## 2015-12-11 NOTE — Progress Notes (Signed)
[place]  [phone] [phone]  ID: Gabriel Delacruz OB: Aug 05, 1928  MR#: [id number]  DPO#:[id number]  Patient Care Team: Marinda Elk, MD as PCP - General (Physician Assistant)  CHIEF COMPLAINT:  Chief Complaint  Patient presents with  . MGUS    INTERVAL HISTORY: Patient returns to clinic today for repeat laboratory work and further evaluation.  He continues to feel well and is asymptomatic. He denies any recent fevers or illnesses.  He denies any weakness or fatigue.  He denies any pain.  He has no neurologic complaints.  He has a good appetite and denies weight loss.  He has no chest pain or shortness of breath.  He denies any nausea, vomiting, constipation, or diarrhea.  He has no urinary complaints.  Patient offers no specific complaints today.    REVIEW OF SYSTEMS:   Review of Systems  Constitutional: Negative.   Respiratory: Negative.   Cardiovascular: Negative.   Musculoskeletal: Negative.   Neurological: Negative.     As per HPI. Otherwise, a complete review of systems is negatve.  PAST MEDICAL HISTORY: Glaucoma, hypertension, hyperlipidemia, BPH, perirectal abscess, COPD, GERD  PAST SURGICAL HISTORY: left renal oncocytoma s/p CT-guided percutaneous renal cryoablation, hypothyroidism.  Tonsillectomy, cervical and lumbar disc surgery.  FAMILY HISTORY: Reviewed and unchanged. No reported history of malignancy or chronic disease.      ADVANCED DIRECTIVES:    HEALTH MAINTENANCE: Social History  Substance Use Topics  . Smoking status: Not on file  . Smokeless tobacco: Not on file  . Alcohol Use: Not on file    Not on File  Current Outpatient Prescriptions  Medication Sig Dispense Refill  . acetaminophen (TYLENOL) 325 MG tablet Take 650 mg by mouth.    Marland Kitchen albuterol (PROAIR HFA) 108 (90 BASE) MCG/ACT inhaler Inhale into the lungs.    Marland Kitchen amLODipine (NORVASC) 5 MG tablet Take 5 mg by mouth.    Marland Kitchen aspirin EC 81  MG tablet Take 81 mg by mouth.    . dorzolamide-timolol (COSOPT) 22.3-6.8 MG/ML ophthalmic solution 1 drop Two (2) times a day as needed    . Fluticasone-Salmeterol (ADVAIR DISKUS) 250-50 MCG/DOSE AEPB Inhale into the lungs.    . furosemide (LASIX) 20 MG tablet Take 20 mg by mouth.    . losartan-hydrochlorothiazide (HYZAAR) 100-12.5 MG tablet Take by mouth.    . multivitamin-iron-minerals-folic acid (CENTRUM) chewable tablet Chew by mouth.    . nebivolol (BYSTOLIC) 10 MG tablet Take 10 mg by mouth.    . potassium chloride (K-DUR) 10 MEQ tablet Take by mouth.    . Travoprost, BAK Free, (TRAVATAN Z) 0.004 % SOLN ophthalmic solution Apply to eye.     No current facility-administered medications for this visit.    OBJECTIVE: Filed Vitals:   12/11/15 1106  BP: 150/67  Pulse: 52  Temp: 97.5 F (36.4 C)  Resp: 16     There is no height on file to calculate BMI.    ECOG FS:0 - Asymptomatic  General: Well-developed, well-nourished, no acute distress. Eyes: Pink conjunctiva, anicteric sclera. Lungs: Clear to auscultation bilaterally. Heart: Regular rate and rhythm. No rubs, murmurs, or gallops. Abdomen: Soft, nontender, nondistended. No organomegaly noted, normoactive bowel sounds. Musculoskeletal: No edema, cyanosis, or clubbing. Neuro: Alert, answering all questions appropriately. Cranial nerves grossly intact. Skin: No rashes or petechiae noted. Psych: Normal affect.   LAB RESULTS:  Lab Results  Component Value Date   NA 135 12/04/2015   K 4.1 12/04/2015   CL  101 12/04/2015   CO2 28 12/04/2015   GLUCOSE 93 12/04/2015   BUN 19 12/04/2015   CREATININE 0.80 12/04/2015   CALCIUM 8.9 12/04/2015   PROT 8.3* 09/30/2011   ALBUMIN 3.8 09/30/2011   AST 22 09/30/2011   ALT 31 09/30/2011   ALKPHOS 94 09/30/2011   BILITOT 0.6 09/30/2011   GFRNONAA >60 12/04/2015   GFRAA >60 12/04/2015    Lab Results  Component Value Date   WBC 4.2 12/04/2015   NEUTROABS 2.1 05/08/2015   HGB  13.8 12/04/2015   HCT 40.0 12/04/2015   MCV 96.8 12/04/2015   PLT 175 12/04/2015     STUDIES: No results found.  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  PLAN:    1. MGUS: Patient's M spike has been essentially unchanged at~ 0.7 for nearly 3 years, today's result is 0.8.  Bone marrow biopsy on October 23, 2009 revealed a plasma cell dyscrasia with 10% abnormal plasma cells.  Patient has no evidence of endorgan damage and is asymptomatic.  No intervention is needed at this time, continue simple observation.  Return to clinic in one year with repeat laboratory work and further evaluation.   2. Hypertension: Patient's blood pressure is elevated, but improved today. Continue current medications.  Patient expressed understanding and was in agreement with this plan. He also understands that He can call clinic at any time with any questions, concerns, or complaints.    Mayra Reel, NP   12/11/2015 12:18 PM  Patient was seen and evaluated independently and I agree with the assessment and plan as dictated above.  Lloyd Huger, MD 12/12/2015 10:21 AM

## 2015-12-12 ENCOUNTER — Other Ambulatory Visit: Payer: Self-pay | Admitting: *Deleted

## 2016-01-07 DIAGNOSIS — M159 Polyosteoarthritis, unspecified: Secondary | ICD-10-CM | POA: Diagnosis not present

## 2016-01-07 DIAGNOSIS — I4510 Unspecified right bundle-branch block: Secondary | ICD-10-CM | POA: Diagnosis not present

## 2016-01-07 DIAGNOSIS — D3002 Benign neoplasm of left kidney: Secondary | ICD-10-CM | POA: Diagnosis not present

## 2016-01-07 DIAGNOSIS — E782 Mixed hyperlipidemia: Secondary | ICD-10-CM | POA: Diagnosis not present

## 2016-01-07 DIAGNOSIS — E039 Hypothyroidism, unspecified: Secondary | ICD-10-CM | POA: Diagnosis not present

## 2016-01-07 DIAGNOSIS — D472 Monoclonal gammopathy: Secondary | ICD-10-CM | POA: Diagnosis not present

## 2016-01-07 DIAGNOSIS — J449 Chronic obstructive pulmonary disease, unspecified: Secondary | ICD-10-CM | POA: Diagnosis not present

## 2016-01-07 DIAGNOSIS — I10 Essential (primary) hypertension: Secondary | ICD-10-CM | POA: Diagnosis not present

## 2016-02-05 DIAGNOSIS — H401131 Primary open-angle glaucoma, bilateral, mild stage: Secondary | ICD-10-CM | POA: Diagnosis not present

## 2016-03-15 DIAGNOSIS — J449 Chronic obstructive pulmonary disease, unspecified: Secondary | ICD-10-CM | POA: Diagnosis not present

## 2016-03-15 DIAGNOSIS — I10 Essential (primary) hypertension: Secondary | ICD-10-CM | POA: Diagnosis not present

## 2016-03-15 DIAGNOSIS — E782 Mixed hyperlipidemia: Secondary | ICD-10-CM | POA: Diagnosis not present

## 2016-05-03 DIAGNOSIS — I10 Essential (primary) hypertension: Secondary | ICD-10-CM | POA: Diagnosis not present

## 2016-05-03 DIAGNOSIS — E782 Mixed hyperlipidemia: Secondary | ICD-10-CM | POA: Diagnosis not present

## 2016-05-03 DIAGNOSIS — E039 Hypothyroidism, unspecified: Secondary | ICD-10-CM | POA: Diagnosis not present

## 2016-05-10 DIAGNOSIS — I4510 Unspecified right bundle-branch block: Secondary | ICD-10-CM | POA: Diagnosis not present

## 2016-05-10 DIAGNOSIS — E039 Hypothyroidism, unspecified: Secondary | ICD-10-CM | POA: Diagnosis not present

## 2016-05-10 DIAGNOSIS — E782 Mixed hyperlipidemia: Secondary | ICD-10-CM | POA: Diagnosis not present

## 2016-05-10 DIAGNOSIS — I10 Essential (primary) hypertension: Secondary | ICD-10-CM | POA: Diagnosis not present

## 2016-05-10 DIAGNOSIS — J449 Chronic obstructive pulmonary disease, unspecified: Secondary | ICD-10-CM | POA: Diagnosis not present

## 2016-05-10 DIAGNOSIS — Z23 Encounter for immunization: Secondary | ICD-10-CM | POA: Diagnosis not present

## 2016-05-10 DIAGNOSIS — D472 Monoclonal gammopathy: Secondary | ICD-10-CM | POA: Diagnosis not present

## 2016-06-02 DIAGNOSIS — J449 Chronic obstructive pulmonary disease, unspecified: Secondary | ICD-10-CM | POA: Diagnosis not present

## 2016-07-15 DIAGNOSIS — N138 Other obstructive and reflux uropathy: Secondary | ICD-10-CM | POA: Diagnosis not present

## 2016-07-15 DIAGNOSIS — N401 Enlarged prostate with lower urinary tract symptoms: Secondary | ICD-10-CM | POA: Diagnosis not present

## 2016-07-15 DIAGNOSIS — N281 Cyst of kidney, acquired: Secondary | ICD-10-CM | POA: Diagnosis not present

## 2016-07-15 DIAGNOSIS — N2889 Other specified disorders of kidney and ureter: Secondary | ICD-10-CM | POA: Diagnosis not present

## 2016-07-15 DIAGNOSIS — D3002 Benign neoplasm of left kidney: Secondary | ICD-10-CM | POA: Diagnosis not present

## 2016-07-15 DIAGNOSIS — N200 Calculus of kidney: Secondary | ICD-10-CM | POA: Diagnosis not present

## 2016-08-23 DIAGNOSIS — H401131 Primary open-angle glaucoma, bilateral, mild stage: Secondary | ICD-10-CM | POA: Diagnosis not present

## 2016-09-14 DIAGNOSIS — I10 Essential (primary) hypertension: Secondary | ICD-10-CM | POA: Diagnosis not present

## 2016-09-14 DIAGNOSIS — E039 Hypothyroidism, unspecified: Secondary | ICD-10-CM | POA: Diagnosis not present

## 2016-09-14 DIAGNOSIS — J449 Chronic obstructive pulmonary disease, unspecified: Secondary | ICD-10-CM | POA: Diagnosis not present

## 2016-10-30 ENCOUNTER — Emergency Department: Payer: Medicare HMO

## 2016-10-30 ENCOUNTER — Emergency Department
Admission: EM | Admit: 2016-10-30 | Discharge: 2016-10-30 | Disposition: A | Discharge status: Home / Self Care | Payer: Medicare HMO | Attending: Emergency Medicine | Admitting: Emergency Medicine

## 2016-10-30 ENCOUNTER — Encounter: Payer: Self-pay | Admitting: Emergency Medicine

## 2016-10-30 DIAGNOSIS — R0602 Shortness of breath: Secondary | ICD-10-CM | POA: Diagnosis not present

## 2016-10-30 DIAGNOSIS — J441 Chronic obstructive pulmonary disease with (acute) exacerbation: Secondary | ICD-10-CM | POA: Insufficient documentation

## 2016-10-30 DIAGNOSIS — Z7982 Long term (current) use of aspirin: Secondary | ICD-10-CM | POA: Insufficient documentation

## 2016-10-30 DIAGNOSIS — Z79899 Other long term (current) drug therapy: Secondary | ICD-10-CM | POA: Diagnosis not present

## 2016-10-30 DIAGNOSIS — I10 Essential (primary) hypertension: Secondary | ICD-10-CM | POA: Diagnosis not present

## 2016-10-30 DIAGNOSIS — J40 Bronchitis, not specified as acute or chronic: Secondary | ICD-10-CM | POA: Diagnosis not present

## 2016-10-30 HISTORY — DX: Essential (primary) hypertension: I10

## 2016-10-30 HISTORY — DX: Unspecified glaucoma: H40.9

## 2016-10-30 LAB — CBC
HEMATOCRIT: 38.2 % — AB (ref 40.0–52.0)
HEMOGLOBIN: 13.4 g/dL (ref 13.0–18.0)
MCH: 34.1 pg — ABNORMAL HIGH (ref 26.0–34.0)
MCHC: 35.2 g/dL (ref 32.0–36.0)
MCV: 96.9 fL (ref 80.0–100.0)
Platelets: 170 10*3/uL (ref 150–440)
RBC: 3.94 MIL/uL — AB (ref 4.40–5.90)
RDW: 12.6 % (ref 11.5–14.5)
WBC: 5.7 10*3/uL (ref 3.8–10.6)

## 2016-10-30 LAB — BASIC METABOLIC PANEL
ANION GAP: 9 (ref 5–15)
BUN: 30 mg/dL — ABNORMAL HIGH (ref 6–20)
CHLORIDE: 100 mmol/L — AB (ref 101–111)
CO2: 25 mmol/L (ref 22–32)
Calcium: 9 mg/dL (ref 8.9–10.3)
Creatinine, Ser: 0.98 mg/dL (ref 0.61–1.24)
GFR calc Af Amer: >60 mL/min (ref >60)
Glucose, Bld: 128 mg/dL — ABNORMAL HIGH (ref 65–99)
POTASSIUM: 3.2 mmol/L — AB (ref 3.5–5.1)
SODIUM: 134 mmol/L — AB (ref 135–145)

## 2016-10-30 LAB — TROPONIN I: Troponin I: 0.03 ng/mL (ref <0.03)

## 2016-10-30 MED ORDER — IPRATROPIUM-ALBUTEROL 0.5-2.5 (3) MG/3ML IN SOLN
3.0000 mL | Freq: Once | RESPIRATORY_TRACT | Status: AC
  Administered 2016-10-30: 3 mL via RESPIRATORY_TRACT
  Filled 2016-10-30: qty 3

## 2016-10-30 MED ORDER — PREDNISONE 20 MG PO TABS
40.0000 mg | ORAL_TABLET | Freq: Once | ORAL | Status: AC
  Administered 2016-10-30: 40 mg via ORAL

## 2016-10-30 MED ORDER — PREDNISONE 20 MG PO TABS: 40.0000 mg | ORAL_TABLET | Freq: Every day | ORAL | 0 refills | Status: DC

## 2016-10-30 MED ORDER — PREDNISONE 20 MG PO TABS
ORAL_TABLET | ORAL | Status: AC
  Administered 2016-10-30: 40 mg via ORAL
  Filled 2016-10-30: qty 2

## 2016-10-30 NOTE — Discharge Instructions (Signed)
Please seek medical attention for any high fevers, chest pain, shortness of breath, change in behavior, persistent vomiting, bloody stool or any other new or concerning symptoms.  

## 2016-10-30 NOTE — ED Provider Notes (Signed)
[facility] Emergency Department Provider Note  ____________________________________________   I have reviewed the triage vital signs and the nursing notes.   HISTORY  Chief Complaint Shortness of Breath   History limited by: Not Limited   HPI Gabriel Delacruz is a 83 y.o. male who presents to the emergency department today because of concerns for continued chest congestion. The patient states that the symptoms have been going on for roughly 1 week. He was prescribed a Z-Pak by his primary care doctor. He states he finished it yesterday but does not feel any better. He denies any pain in his chest. He denies any fevers. Has not noticed any swelling of his arms or legs.   Past Medical History:  Diagnosis Date  . Glaucoma   . Hypertension     Patient Active Problem List   Diagnosis Date Noted  . MGUS (monoclonal gammopathy of unknown significance) 12/11/2015    History reviewed. No pertinent surgical history.  Prior to Admission medications   Medication Sig Start Date End Date Taking? Authorizing Provider  acetaminophen (TYLENOL) 325 MG tablet Take 650 mg by mouth every 6 (six) hours as needed.    Yes Historical Provider, MD  albuterol (PROAIR HFA) 108 (90 BASE) MCG/ACT inhaler Inhale 2 puffs into the lungs every 6 (six) hours as needed.    Yes Historical Provider, MD  amLODipine (NORVASC) 5 MG tablet Take 5 mg by mouth daily.     Historical Provider, MD  aspirin EC 81 MG tablet Take 81 mg by mouth daily.     Historical Provider, MD  dorzolamide-timolol (COSOPT) 22.3-6.8 MG/ML ophthalmic solution 1 drop Two (2) times a day as needed    Historical Provider, MD  Fluticasone-Salmeterol (ADVAIR DISKUS) 250-50 MCG/DOSE AEPB Inhale 1 puff into the lungs 2 (two) times daily.     Historical Provider, MD  furosemide (LASIX) 20 MG tablet Take 20 mg by mouth daily.     Historical Provider, MD  losartan-hydrochlorothiazide (HYZAAR) 100-12.5 MG tablet Take 1  tablet by mouth daily.     Historical Provider, MD  multivitamin-iron-minerals-folic acid (CENTRUM) chewable tablet Chew 1 tablet by mouth daily.     Historical Provider, MD  nebivolol (BYSTOLIC) 10 MG tablet Take 10 mg by mouth daily.     Historical Provider, MD  potassium chloride (K-DUR) 10 MEQ tablet Take 10 mEq by mouth daily.     Historical Provider, MD  Travoprost, BAK Free, (TRAVATAN Z) 0.004 % SOLN ophthalmic solution Place 2 drops into both eyes at bedtime.     Historical Provider, MD    Allergies Patient has no known allergies.  No family history on file.  Social History Social History  Substance Use Topics  . Smoking status: Never Smoker  . Smokeless tobacco: Never Used  . Alcohol use Not on file    Review of Systems  Constitutional: Negative for fever. Cardiovascular: Negative for chest pain. Respiratory: Positive for shortness of breath. Gastrointestinal: Negative for abdominal pain, vomiting and diarrhea. Neurological: Negative for headaches, focal weakness or numbness.  10-point ROS otherwise negative.  ____________________________________________   PHYSICAL EXAM:  VITAL SIGNS: ED Triage Vitals [10/30/16 1919]  Enc Vitals Group     BP (!) 146/83     Pulse Rate 67     Resp 18     Temp 98.1 F (36.7 C)     Temp Source Oral     SpO2 97 %     Weight 198 lb (89.8 kg)  Height 5\' 8"  (1.727 m)   Constitutional: Alert and oriented.  Eyes: Conjunctivae are normal. Normal extraocular movements. ENT   Head: Normocephalic and atraumatic.   Nose: No congestion/rhinnorhea.   Mouth/Throat: Mucous membranes are moist.   Neck: No stridor. Hematological/Lymphatic/Immunilogical: No cervical lymphadenopathy. Cardiovascular: Normal rate, regular rhythm.  No murmurs, rubs, or gallops.  Respiratory: Slightly increased respiratory effort. Diffuse expiratory wheezing.  Gastrointestinal: Soft and non tender. No rebound. No guarding.  Genitourinary:  Deferred Musculoskeletal: Normal range of motion in all extremities. No lower extremity edema. Neurologic:  Normal speech and language. No gross focal neurologic deficits are appreciated.  Skin:  Skin is warm, dry and intact. No rash noted. Psychiatric: Mood and affect are normal. Speech and behavior are normal. Patient exhibits appropriate insight and judgment.  ____________________________________________    LABS (pertinent positives/negatives)  Labs Reviewed  BASIC METABOLIC PANEL - Abnormal; Notable for the following:       Result Value   Sodium 134 (*)    Potassium 3.2 (*)    Chloride 100 (*)    Glucose, Bld 128 (*)    BUN 30 (*)    All other components within normal limits  CBC - Abnormal; Notable for the following:    RBC 3.94 (*)    HCT 38.2 (*)    MCH 34.1 (*)    All other components within normal limits  TROPONIN I - Abnormal; Notable for the following:    Troponin I 0.03 (*)    All other components within normal limits    ____________________________________________   EKG  I, Nance Pear, attending physician, personally viewed and interpreted this EKG  EKG Time: 1926 Rate: 72 Rhythm: atrial fibrillation Axis: left axis deviation Intervals: qtc 467 QRS: RBBB, LAFB ST changes: no st elevation Impression: abnormal ekg   ____________________________________________    RADIOLOGY  CXR IMPRESSION:  1. No radiographic evidence of acute cardiopulmonary disease.  2. Cardiomegaly with evidence of left ventricular hypertrophy.  3. Aortic atherosclerosis.   ___________________________________________   PROCEDURES  Procedures  ____________________________________________   INITIAL IMPRESSION / ASSESSMENT AND PLAN / ED COURSE  Pertinent labs & imaging results that were available during my care of the patient were reviewed by me and considered in my medical decision making (see chart for details).  Patient presented to the emergency department  today with concerns for continued shortness breath and cough. Patient was recently treated with a Z-Pak. On exam patient did have some expiratory wheezing. Patient's chest x-ray without any concerning pneumonia. Patient did feel better and lungs were more open after DuoNeb treatments.  ____________________________________________   FINAL CLINICAL IMPRESSION(S) / ED DIAGNOSES  Final diagnoses:  COPD exacerbation ([place])  Bronchitis     Note: This dictation was prepared with Dragon dictation. Any transcriptional errors that result from this process are unintentional     Nance Pear, MD 10/30/16 2241

## 2016-10-30 NOTE — ED Triage Notes (Signed)
Patient states that he has a cold last week and was prescribed a z pack. Patient with increase shortness of breath. Patient with labored breathing and audible crackles.

## 2016-10-30 NOTE — ED Notes (Signed)
CRITICAL LAB: TROPONIN is 0.03, from [place], [place] Dr. Archie Balboa notified, orders received

## 2016-11-04 DIAGNOSIS — J440 Chronic obstructive pulmonary disease with acute lower respiratory infection: Secondary | ICD-10-CM | POA: Diagnosis not present

## 2016-11-11 DIAGNOSIS — J441 Chronic obstructive pulmonary disease with (acute) exacerbation: Secondary | ICD-10-CM | POA: Diagnosis not present

## 2016-11-11 DIAGNOSIS — J209 Acute bronchitis, unspecified: Secondary | ICD-10-CM | POA: Diagnosis not present

## 2016-11-15 DIAGNOSIS — J441 Chronic obstructive pulmonary disease with (acute) exacerbation: Secondary | ICD-10-CM | POA: Diagnosis not present

## 2016-11-15 DIAGNOSIS — J209 Acute bronchitis, unspecified: Secondary | ICD-10-CM | POA: Diagnosis not present

## 2016-11-15 DIAGNOSIS — D649 Anemia, unspecified: Secondary | ICD-10-CM | POA: Diagnosis not present

## 2016-11-15 DIAGNOSIS — J440 Chronic obstructive pulmonary disease with acute lower respiratory infection: Secondary | ICD-10-CM | POA: Diagnosis not present

## 2016-11-15 DIAGNOSIS — I10 Essential (primary) hypertension: Secondary | ICD-10-CM | POA: Diagnosis not present

## 2016-11-15 DIAGNOSIS — M25562 Pain in left knee: Secondary | ICD-10-CM | POA: Diagnosis not present

## 2016-11-15 DIAGNOSIS — I4510 Unspecified right bundle-branch block: Secondary | ICD-10-CM | POA: Diagnosis not present

## 2016-11-15 DIAGNOSIS — M1990 Unspecified osteoarthritis, unspecified site: Secondary | ICD-10-CM | POA: Diagnosis not present

## 2016-11-15 DIAGNOSIS — M109 Gout, unspecified: Secondary | ICD-10-CM | POA: Diagnosis not present

## 2016-11-18 DIAGNOSIS — J441 Chronic obstructive pulmonary disease with (acute) exacerbation: Secondary | ICD-10-CM | POA: Diagnosis not present

## 2016-11-18 DIAGNOSIS — D649 Anemia, unspecified: Secondary | ICD-10-CM | POA: Diagnosis not present

## 2016-11-18 DIAGNOSIS — M1990 Unspecified osteoarthritis, unspecified site: Secondary | ICD-10-CM | POA: Diagnosis not present

## 2016-11-18 DIAGNOSIS — J209 Acute bronchitis, unspecified: Secondary | ICD-10-CM | POA: Diagnosis not present

## 2016-11-18 DIAGNOSIS — M109 Gout, unspecified: Secondary | ICD-10-CM | POA: Diagnosis not present

## 2016-11-18 DIAGNOSIS — I10 Essential (primary) hypertension: Secondary | ICD-10-CM | POA: Diagnosis not present

## 2016-11-18 DIAGNOSIS — M25562 Pain in left knee: Secondary | ICD-10-CM | POA: Diagnosis not present

## 2016-11-18 DIAGNOSIS — J440 Chronic obstructive pulmonary disease with acute lower respiratory infection: Secondary | ICD-10-CM | POA: Diagnosis not present

## 2016-11-18 DIAGNOSIS — I4510 Unspecified right bundle-branch block: Secondary | ICD-10-CM | POA: Diagnosis not present

## 2016-11-22 DIAGNOSIS — J440 Chronic obstructive pulmonary disease with acute lower respiratory infection: Secondary | ICD-10-CM | POA: Diagnosis not present

## 2016-11-22 DIAGNOSIS — J441 Chronic obstructive pulmonary disease with (acute) exacerbation: Secondary | ICD-10-CM | POA: Diagnosis not present

## 2016-11-22 DIAGNOSIS — M1990 Unspecified osteoarthritis, unspecified site: Secondary | ICD-10-CM | POA: Diagnosis not present

## 2016-11-22 DIAGNOSIS — I4510 Unspecified right bundle-branch block: Secondary | ICD-10-CM | POA: Diagnosis not present

## 2016-11-22 DIAGNOSIS — M109 Gout, unspecified: Secondary | ICD-10-CM | POA: Diagnosis not present

## 2016-11-22 DIAGNOSIS — M25562 Pain in left knee: Secondary | ICD-10-CM | POA: Diagnosis not present

## 2016-11-22 DIAGNOSIS — I10 Essential (primary) hypertension: Secondary | ICD-10-CM | POA: Diagnosis not present

## 2016-11-22 DIAGNOSIS — D649 Anemia, unspecified: Secondary | ICD-10-CM | POA: Diagnosis not present

## 2016-11-22 DIAGNOSIS — J209 Acute bronchitis, unspecified: Secondary | ICD-10-CM | POA: Diagnosis not present

## 2016-11-24 DIAGNOSIS — M109 Gout, unspecified: Secondary | ICD-10-CM | POA: Diagnosis not present

## 2016-11-24 DIAGNOSIS — M25562 Pain in left knee: Secondary | ICD-10-CM | POA: Diagnosis not present

## 2016-11-24 DIAGNOSIS — I4510 Unspecified right bundle-branch block: Secondary | ICD-10-CM | POA: Diagnosis not present

## 2016-11-24 DIAGNOSIS — I10 Essential (primary) hypertension: Secondary | ICD-10-CM | POA: Diagnosis not present

## 2016-11-24 DIAGNOSIS — D649 Anemia, unspecified: Secondary | ICD-10-CM | POA: Diagnosis not present

## 2016-11-24 DIAGNOSIS — J209 Acute bronchitis, unspecified: Secondary | ICD-10-CM | POA: Diagnosis not present

## 2016-11-24 DIAGNOSIS — J440 Chronic obstructive pulmonary disease with acute lower respiratory infection: Secondary | ICD-10-CM | POA: Diagnosis not present

## 2016-11-24 DIAGNOSIS — J441 Chronic obstructive pulmonary disease with (acute) exacerbation: Secondary | ICD-10-CM | POA: Diagnosis not present

## 2016-11-24 DIAGNOSIS — M1990 Unspecified osteoarthritis, unspecified site: Secondary | ICD-10-CM | POA: Diagnosis not present

## 2016-11-26 DIAGNOSIS — I10 Essential (primary) hypertension: Secondary | ICD-10-CM | POA: Diagnosis not present

## 2016-11-26 DIAGNOSIS — M25562 Pain in left knee: Secondary | ICD-10-CM | POA: Diagnosis not present

## 2016-11-26 DIAGNOSIS — D649 Anemia, unspecified: Secondary | ICD-10-CM | POA: Diagnosis not present

## 2016-11-26 DIAGNOSIS — J441 Chronic obstructive pulmonary disease with (acute) exacerbation: Secondary | ICD-10-CM | POA: Diagnosis not present

## 2016-11-26 DIAGNOSIS — J209 Acute bronchitis, unspecified: Secondary | ICD-10-CM | POA: Diagnosis not present

## 2016-11-26 DIAGNOSIS — I4510 Unspecified right bundle-branch block: Secondary | ICD-10-CM | POA: Diagnosis not present

## 2016-11-26 DIAGNOSIS — J440 Chronic obstructive pulmonary disease with acute lower respiratory infection: Secondary | ICD-10-CM | POA: Diagnosis not present

## 2016-11-26 DIAGNOSIS — M109 Gout, unspecified: Secondary | ICD-10-CM | POA: Diagnosis not present

## 2016-11-26 DIAGNOSIS — M1990 Unspecified osteoarthritis, unspecified site: Secondary | ICD-10-CM | POA: Diagnosis not present

## 2016-11-29 DIAGNOSIS — J440 Chronic obstructive pulmonary disease with acute lower respiratory infection: Secondary | ICD-10-CM | POA: Diagnosis not present

## 2016-11-29 DIAGNOSIS — D649 Anemia, unspecified: Secondary | ICD-10-CM | POA: Diagnosis not present

## 2016-11-29 DIAGNOSIS — M1990 Unspecified osteoarthritis, unspecified site: Secondary | ICD-10-CM | POA: Diagnosis not present

## 2016-11-29 DIAGNOSIS — M109 Gout, unspecified: Secondary | ICD-10-CM | POA: Diagnosis not present

## 2016-11-29 DIAGNOSIS — J441 Chronic obstructive pulmonary disease with (acute) exacerbation: Secondary | ICD-10-CM | POA: Diagnosis not present

## 2016-11-29 DIAGNOSIS — J209 Acute bronchitis, unspecified: Secondary | ICD-10-CM | POA: Diagnosis not present

## 2016-11-29 DIAGNOSIS — M25562 Pain in left knee: Secondary | ICD-10-CM | POA: Diagnosis not present

## 2016-11-29 DIAGNOSIS — I4510 Unspecified right bundle-branch block: Secondary | ICD-10-CM | POA: Diagnosis not present

## 2016-11-29 DIAGNOSIS — I10 Essential (primary) hypertension: Secondary | ICD-10-CM | POA: Diagnosis not present

## 2016-12-01 DIAGNOSIS — J209 Acute bronchitis, unspecified: Secondary | ICD-10-CM | POA: Diagnosis not present

## 2016-12-01 DIAGNOSIS — I10 Essential (primary) hypertension: Secondary | ICD-10-CM | POA: Diagnosis not present

## 2016-12-01 DIAGNOSIS — M109 Gout, unspecified: Secondary | ICD-10-CM | POA: Diagnosis not present

## 2016-12-01 DIAGNOSIS — D649 Anemia, unspecified: Secondary | ICD-10-CM | POA: Diagnosis not present

## 2016-12-01 DIAGNOSIS — J440 Chronic obstructive pulmonary disease with acute lower respiratory infection: Secondary | ICD-10-CM | POA: Diagnosis not present

## 2016-12-01 DIAGNOSIS — J441 Chronic obstructive pulmonary disease with (acute) exacerbation: Secondary | ICD-10-CM | POA: Diagnosis not present

## 2016-12-01 DIAGNOSIS — I4510 Unspecified right bundle-branch block: Secondary | ICD-10-CM | POA: Diagnosis not present

## 2016-12-01 DIAGNOSIS — M25562 Pain in left knee: Secondary | ICD-10-CM | POA: Diagnosis not present

## 2016-12-01 DIAGNOSIS — M1990 Unspecified osteoarthritis, unspecified site: Secondary | ICD-10-CM | POA: Diagnosis not present

## 2016-12-04 DIAGNOSIS — J441 Chronic obstructive pulmonary disease with (acute) exacerbation: Secondary | ICD-10-CM | POA: Diagnosis not present

## 2016-12-04 DIAGNOSIS — M1990 Unspecified osteoarthritis, unspecified site: Secondary | ICD-10-CM | POA: Diagnosis not present

## 2016-12-04 DIAGNOSIS — M25562 Pain in left knee: Secondary | ICD-10-CM | POA: Diagnosis not present

## 2016-12-04 DIAGNOSIS — I4510 Unspecified right bundle-branch block: Secondary | ICD-10-CM | POA: Diagnosis not present

## 2016-12-04 DIAGNOSIS — J209 Acute bronchitis, unspecified: Secondary | ICD-10-CM | POA: Diagnosis not present

## 2016-12-04 DIAGNOSIS — M109 Gout, unspecified: Secondary | ICD-10-CM | POA: Diagnosis not present

## 2016-12-04 DIAGNOSIS — J440 Chronic obstructive pulmonary disease with acute lower respiratory infection: Secondary | ICD-10-CM | POA: Diagnosis not present

## 2016-12-04 DIAGNOSIS — D649 Anemia, unspecified: Secondary | ICD-10-CM | POA: Diagnosis not present

## 2016-12-04 DIAGNOSIS — I10 Essential (primary) hypertension: Secondary | ICD-10-CM | POA: Diagnosis not present

## 2016-12-06 DIAGNOSIS — M109 Gout, unspecified: Secondary | ICD-10-CM | POA: Diagnosis not present

## 2016-12-06 DIAGNOSIS — I4510 Unspecified right bundle-branch block: Secondary | ICD-10-CM | POA: Diagnosis not present

## 2016-12-06 DIAGNOSIS — J440 Chronic obstructive pulmonary disease with acute lower respiratory infection: Secondary | ICD-10-CM | POA: Diagnosis not present

## 2016-12-06 DIAGNOSIS — D649 Anemia, unspecified: Secondary | ICD-10-CM | POA: Diagnosis not present

## 2016-12-06 DIAGNOSIS — J441 Chronic obstructive pulmonary disease with (acute) exacerbation: Secondary | ICD-10-CM | POA: Diagnosis not present

## 2016-12-06 DIAGNOSIS — J209 Acute bronchitis, unspecified: Secondary | ICD-10-CM | POA: Diagnosis not present

## 2016-12-06 DIAGNOSIS — M25562 Pain in left knee: Secondary | ICD-10-CM | POA: Diagnosis not present

## 2016-12-06 DIAGNOSIS — M1990 Unspecified osteoarthritis, unspecified site: Secondary | ICD-10-CM | POA: Diagnosis not present

## 2016-12-06 DIAGNOSIS — I10 Essential (primary) hypertension: Secondary | ICD-10-CM | POA: Diagnosis not present

## 2016-12-10 ENCOUNTER — Inpatient Hospital Stay: Payer: Medicare HMO | Attending: Oncology

## 2016-12-10 DIAGNOSIS — D472 Monoclonal gammopathy: Secondary | ICD-10-CM | POA: Diagnosis not present

## 2016-12-10 LAB — BASIC METABOLIC PANEL
ANION GAP: 5 (ref 5–15)
BUN: 15 mg/dL (ref 6–20)
CALCIUM: 9.3 mg/dL (ref 8.9–10.3)
CO2: 29 mmol/L (ref 22–32)
Chloride: 101 mmol/L (ref 101–111)
Creatinine, Ser: 0.74 mg/dL (ref 0.61–1.24)
GFR calc Af Amer: >60 mL/min (ref >60)
GLUCOSE: 94 mg/dL (ref 65–99)
Potassium: 4.2 mmol/L (ref 3.5–5.1)
Sodium: 135 mmol/L (ref 135–145)

## 2016-12-10 LAB — CBC WITH DIFFERENTIAL/PLATELET
BASOS PCT: 2 %
Basophils Absolute: 0.1 10*3/uL (ref 0–0.1)
EOS ABS: 0.3 10*3/uL (ref 0–0.7)
Eosinophils Relative: 6 %
HCT: 37.6 % — ABNORMAL LOW (ref 40.0–52.0)
HEMOGLOBIN: 13 g/dL (ref 13.0–18.0)
Lymphocytes Relative: 39 %
Lymphs Abs: 1.6 10*3/uL (ref 1.0–3.6)
MCH: 33.5 pg (ref 26.0–34.0)
MCHC: 34.4 g/dL (ref 32.0–36.0)
MCV: 97.4 fL (ref 80.0–100.0)
Monocytes Absolute: 0.3 10*3/uL (ref 0.2–1.0)
Monocytes Relative: 8 %
Neutro Abs: 1.9 10*3/uL (ref 1.4–6.5)
Neutrophils Relative %: 45 %
PLATELETS: 204 10*3/uL (ref 150–440)
RBC: 3.87 MIL/uL — AB (ref 4.40–5.90)
RDW: 13.7 % (ref 11.5–14.5)
WBC: 4.2 10*3/uL (ref 3.8–10.6)

## 2016-12-13 LAB — PROTEIN ELECTROPHORESIS, SERUM
A/G Ratio: 1 (ref 0.7–1.7)
ALPHA-1-GLOBULIN: 0.2 g/dL (ref 0.0–0.4)
Albumin ELP: 3.8 g/dL (ref 2.9–4.4)
Alpha-2-Globulin: 0.8 g/dL (ref 0.4–1.0)
Beta Globulin: 1 g/dL (ref 0.7–1.3)
GAMMA GLOBULIN: 1.6 g/dL (ref 0.4–1.8)
GLOBULIN, TOTAL: 3.7 g/dL (ref 2.2–3.9)
M-SPIKE, %: 0.6 g/dL — AB
TOTAL PROTEIN ELP: 7.5 g/dL (ref 6.0–8.5)

## 2016-12-13 LAB — PROTEIN ELECTRO, RANDOM URINE
ALBUMIN ELP UR: 23.9 %
Alpha-1-Globulin, U: 4 %
Alpha-2-Globulin, U: 17.9 %
BETA GLOBULIN, U: 23.5 %
Gamma Globulin, U: 30.7 %
M Component, Ur: 15.7 % — ABNORMAL HIGH
TOTAL PROTEIN, URINE-UPE24: 14.8 mg/dL

## 2016-12-14 DIAGNOSIS — M1990 Unspecified osteoarthritis, unspecified site: Secondary | ICD-10-CM | POA: Diagnosis not present

## 2016-12-14 DIAGNOSIS — I4510 Unspecified right bundle-branch block: Secondary | ICD-10-CM | POA: Diagnosis not present

## 2016-12-14 DIAGNOSIS — M109 Gout, unspecified: Secondary | ICD-10-CM | POA: Diagnosis not present

## 2016-12-14 DIAGNOSIS — M25562 Pain in left knee: Secondary | ICD-10-CM | POA: Diagnosis not present

## 2016-12-14 DIAGNOSIS — J441 Chronic obstructive pulmonary disease with (acute) exacerbation: Secondary | ICD-10-CM | POA: Diagnosis not present

## 2016-12-14 DIAGNOSIS — I10 Essential (primary) hypertension: Secondary | ICD-10-CM | POA: Diagnosis not present

## 2016-12-14 DIAGNOSIS — D649 Anemia, unspecified: Secondary | ICD-10-CM | POA: Diagnosis not present

## 2016-12-14 DIAGNOSIS — J440 Chronic obstructive pulmonary disease with acute lower respiratory infection: Secondary | ICD-10-CM | POA: Diagnosis not present

## 2016-12-14 DIAGNOSIS — J209 Acute bronchitis, unspecified: Secondary | ICD-10-CM | POA: Diagnosis not present

## 2016-12-15 NOTE — Progress Notes (Signed)
[place]  Telephone:([phone] [phone]) [phone]  ID: Gabriel Delacruz OB: 07/25/1928  MR#: [id number]  QPR#:[id number]  Patient Care Team: Marinda Elk, MD as PCP - General (Physician Assistant)  CHIEF COMPLAINT: MGUS  INTERVAL HISTORY: Patient returns to clinic today for repeat laboratory work and routine six-month evaluation.  He continues to feel well and is asymptomatic. He denies any recent fevers or illnesses.  He denies any weakness or fatigue.  He denies any pain.  He has no neurologic complaints.  He has a good appetite and denies weight loss.  He has no chest pain or shortness of breath.  He denies any nausea, vomiting, constipation, or diarrhea.  He has no urinary complaints.  Patient offers no specific complaints today.    REVIEW OF SYSTEMS:   Review of Systems  Constitutional: Negative.  Negative for fever, malaise/fatigue and weight loss.  Respiratory: Negative.  Negative for cough and shortness of breath.   Cardiovascular: Negative.  Negative for chest pain and leg swelling.  Gastrointestinal: Negative.  Negative for abdominal pain.  Genitourinary: Negative.   Musculoskeletal: Negative.   Skin: Negative.  Negative for rash.  Neurological: Negative.  Negative for sensory change and weakness.  Psychiatric/Behavioral: Negative.  The patient is not nervous/anxious.     As per HPI. Otherwise, a complete review of systems is negative.  PAST MEDICAL HISTORY: Glaucoma, hypertension, hyperlipidemia, BPH, perirectal abscess, COPD, GERD  PAST SURGICAL HISTORY: left renal oncocytoma s/p CT-guided percutaneous renal cryoablation, hypothyroidism.  Tonsillectomy, cervical and lumbar disc surgery.  FAMILY HISTORY: Reviewed and unchanged. No reported history of malignancy or chronic disease.      ADVANCED DIRECTIVES:    HEALTH MAINTENANCE: Social History  Substance Use Topics  . Smoking status: Never Smoker  . Smokeless tobacco: Never  Used  . Alcohol use Not on file     Colonoscopy:  PAP:  Bone density:  Lipid panel:  No Known Allergies  Current Outpatient Prescriptions  Medication Sig Dispense Refill  . acetaminophen (TYLENOL) 325 MG tablet Take 650 mg by mouth every 6 (six) hours as needed.     Marland Kitchen albuterol (PROAIR HFA) 108 (90 BASE) MCG/ACT inhaler Inhale 2 puffs into the lungs every 6 (six) hours as needed.     Marland Kitchen amLODipine (NORVASC) 5 MG tablet Take 5 mg by mouth daily.     Marland Kitchen aspirin EC 81 MG tablet Take 81 mg by mouth daily.     . dorzolamide-timolol (COSOPT) 22.3-6.8 MG/ML ophthalmic solution 1 drop Two (2) times a day as needed    . Fluticasone-Salmeterol (ADVAIR DISKUS) 250-50 MCG/DOSE AEPB Inhale 1 puff into the lungs 2 (two) times daily.     . furosemide (LASIX) 20 MG tablet Take 20 mg by mouth daily.     Marland Kitchen losartan-hydrochlorothiazide (HYZAAR) 100-12.5 MG tablet Take 1 tablet by mouth daily.     . multivitamin-iron-minerals-folic acid (CENTRUM) chewable tablet Chew 1 tablet by mouth daily.     . nebivolol (BYSTOLIC) 10 MG tablet Take 10 mg by mouth daily.     . potassium chloride (K-DUR) 10 MEQ tablet Take 10 mEq by mouth daily.     . predniSONE (DELTASONE) 20 MG tablet Take 2 tablets (40 mg total) by mouth daily. 8 tablet 0  . Travoprost, BAK Free, (TRAVATAN Z) 0.004 % SOLN ophthalmic solution Place 2 drops into both eyes at bedtime.      No current facility-administered medications for this visit.     OBJECTIVE: Vitals:  12/16/16 1003  BP: 137/73  Pulse: 69  Temp: 97.4 F (36.3 C)     Body mass index is 28.98 kg/m.    ECOG FS:0 - Asymptomatic  General: Well-developed, well-nourished, no acute distress. Eyes: Pink conjunctiva, anicteric sclera. Lungs: Clear to auscultation bilaterally. Heart: Regular rate and rhythm. No rubs, murmurs, or gallops. Abdomen: Soft, nontender, nondistended. No organomegaly noted, normoactive bowel sounds. Musculoskeletal: No edema, cyanosis, or  clubbing. Neuro: Alert, answering all questions appropriately. Cranial nerves grossly intact. Skin: No rashes or petechiae noted. Psych: Normal affect.   LAB RESULTS:  Lab Results  Component Value Date   NA 135 12/10/2016   K 4.2 12/10/2016   CL 101 12/10/2016   CO2 29 12/10/2016   GLUCOSE 94 12/10/2016   BUN 15 12/10/2016   CREATININE 0.74 12/10/2016   CALCIUM 9.3 12/10/2016   PROT 8.3 (H) 09/30/2011   ALBUMIN 3.8 09/30/2011   AST 22 09/30/2011   ALT 31 09/30/2011   ALKPHOS 94 09/30/2011   BILITOT 0.6 09/30/2011   GFRNONAA >60 12/10/2016   GFRAA >60 12/10/2016    Lab Results  Component Value Date   WBC 4.2 12/10/2016   NEUTROABS 1.9 12/10/2016   HGB 13.0 12/10/2016   HCT 37.6 (L) 12/10/2016   MCV 97.4 12/10/2016   PLT 204 12/10/2016   Lab Results  Component Value Date   TOTALPROTELP 7.5 12/10/2016   ALBUMINELP 3.8 12/10/2016   A1GS 0.2 12/10/2016   A2GS 0.8 12/10/2016   BETS 1.0 12/10/2016   GAMS 1.6 12/10/2016   MSPIKE 0.6 (H) 12/10/2016   SPEI Comment 12/10/2016     STUDIES: No results found.  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  PLAN:    1. MGUS: Patient's M spike has been essentially unchanged at~ 0.6 - 0.7 for nearly 4 years.  Bone marrow biopsy on October 23, 2009 revealed a plasma cell dyscrasia with 10% abnormal plasma cells.  Patient has no evidence of endorgan damage and is asymptomatic.  No intervention is needed at this time, continue simple observation.  Return to clinic in 6 months with repeat laboratory work and further evaluation.   2.  Left renal Oncocytoma: Followup and treatment per Dr. Jacqlyn Larsen 3.  Hypertension: Patient's blood pressure is within normal limits today. Continue current medications.  Patient expressed understanding and was in agreement with this plan. He also understands that He can call clinic at any time with any questions, concerns, or complaints.    Lloyd Huger, MD   12/16/2016 10:54 AM

## 2016-12-16 ENCOUNTER — Inpatient Hospital Stay: Payer: Medicare HMO | Attending: Oncology | Admitting: Oncology

## 2016-12-16 VITALS — BP 137/73 | HR 69 | Temp 97.4°F | Wt 190.6 lb

## 2016-12-16 DIAGNOSIS — E785 Hyperlipidemia, unspecified: Secondary | ICD-10-CM | POA: Insufficient documentation

## 2016-12-16 DIAGNOSIS — D472 Monoclonal gammopathy: Secondary | ICD-10-CM | POA: Diagnosis not present

## 2016-12-16 DIAGNOSIS — N400 Enlarged prostate without lower urinary tract symptoms: Secondary | ICD-10-CM | POA: Diagnosis not present

## 2016-12-16 DIAGNOSIS — J449 Chronic obstructive pulmonary disease, unspecified: Secondary | ICD-10-CM | POA: Insufficient documentation

## 2016-12-16 DIAGNOSIS — K219 Gastro-esophageal reflux disease without esophagitis: Secondary | ICD-10-CM | POA: Insufficient documentation

## 2016-12-16 DIAGNOSIS — Z79899 Other long term (current) drug therapy: Secondary | ICD-10-CM | POA: Insufficient documentation

## 2016-12-16 DIAGNOSIS — Z7982 Long term (current) use of aspirin: Secondary | ICD-10-CM | POA: Insufficient documentation

## 2016-12-16 DIAGNOSIS — I10 Essential (primary) hypertension: Secondary | ICD-10-CM | POA: Diagnosis not present

## 2016-12-16 DIAGNOSIS — E039 Hypothyroidism, unspecified: Secondary | ICD-10-CM | POA: Diagnosis not present

## 2016-12-16 DIAGNOSIS — H409 Unspecified glaucoma: Secondary | ICD-10-CM | POA: Insufficient documentation

## 2016-12-16 DIAGNOSIS — D3002 Benign neoplasm of left kidney: Secondary | ICD-10-CM | POA: Insufficient documentation

## 2016-12-16 NOTE — Progress Notes (Signed)
Patient here for follow up. Patient states he had to go to the ER for flu about five weeks ago.

## 2016-12-17 DIAGNOSIS — J209 Acute bronchitis, unspecified: Secondary | ICD-10-CM | POA: Diagnosis not present

## 2016-12-17 DIAGNOSIS — J440 Chronic obstructive pulmonary disease with acute lower respiratory infection: Secondary | ICD-10-CM | POA: Diagnosis not present

## 2016-12-17 DIAGNOSIS — M1990 Unspecified osteoarthritis, unspecified site: Secondary | ICD-10-CM | POA: Diagnosis not present

## 2016-12-17 DIAGNOSIS — M25562 Pain in left knee: Secondary | ICD-10-CM | POA: Diagnosis not present

## 2016-12-17 DIAGNOSIS — I10 Essential (primary) hypertension: Secondary | ICD-10-CM | POA: Diagnosis not present

## 2016-12-17 DIAGNOSIS — M109 Gout, unspecified: Secondary | ICD-10-CM | POA: Diagnosis not present

## 2016-12-17 DIAGNOSIS — D649 Anemia, unspecified: Secondary | ICD-10-CM | POA: Diagnosis not present

## 2016-12-17 DIAGNOSIS — J441 Chronic obstructive pulmonary disease with (acute) exacerbation: Secondary | ICD-10-CM | POA: Diagnosis not present

## 2016-12-17 DIAGNOSIS — I4510 Unspecified right bundle-branch block: Secondary | ICD-10-CM | POA: Diagnosis not present

## 2016-12-21 DIAGNOSIS — M1990 Unspecified osteoarthritis, unspecified site: Secondary | ICD-10-CM | POA: Diagnosis not present

## 2016-12-21 DIAGNOSIS — M109 Gout, unspecified: Secondary | ICD-10-CM | POA: Diagnosis not present

## 2016-12-21 DIAGNOSIS — J209 Acute bronchitis, unspecified: Secondary | ICD-10-CM | POA: Diagnosis not present

## 2016-12-21 DIAGNOSIS — M25562 Pain in left knee: Secondary | ICD-10-CM | POA: Diagnosis not present

## 2016-12-21 DIAGNOSIS — I4510 Unspecified right bundle-branch block: Secondary | ICD-10-CM | POA: Diagnosis not present

## 2016-12-21 DIAGNOSIS — I10 Essential (primary) hypertension: Secondary | ICD-10-CM | POA: Diagnosis not present

## 2016-12-21 DIAGNOSIS — D649 Anemia, unspecified: Secondary | ICD-10-CM | POA: Diagnosis not present

## 2016-12-21 DIAGNOSIS — J441 Chronic obstructive pulmonary disease with (acute) exacerbation: Secondary | ICD-10-CM | POA: Diagnosis not present

## 2016-12-21 DIAGNOSIS — J440 Chronic obstructive pulmonary disease with acute lower respiratory infection: Secondary | ICD-10-CM | POA: Diagnosis not present

## 2016-12-23 DIAGNOSIS — M25562 Pain in left knee: Secondary | ICD-10-CM | POA: Diagnosis not present

## 2016-12-23 DIAGNOSIS — I4510 Unspecified right bundle-branch block: Secondary | ICD-10-CM | POA: Diagnosis not present

## 2016-12-23 DIAGNOSIS — M1990 Unspecified osteoarthritis, unspecified site: Secondary | ICD-10-CM | POA: Diagnosis not present

## 2016-12-23 DIAGNOSIS — M109 Gout, unspecified: Secondary | ICD-10-CM | POA: Diagnosis not present

## 2016-12-23 DIAGNOSIS — J440 Chronic obstructive pulmonary disease with acute lower respiratory infection: Secondary | ICD-10-CM | POA: Diagnosis not present

## 2016-12-23 DIAGNOSIS — J209 Acute bronchitis, unspecified: Secondary | ICD-10-CM | POA: Diagnosis not present

## 2016-12-23 DIAGNOSIS — J441 Chronic obstructive pulmonary disease with (acute) exacerbation: Secondary | ICD-10-CM | POA: Diagnosis not present

## 2016-12-23 DIAGNOSIS — D649 Anemia, unspecified: Secondary | ICD-10-CM | POA: Diagnosis not present

## 2016-12-23 DIAGNOSIS — I10 Essential (primary) hypertension: Secondary | ICD-10-CM | POA: Diagnosis not present

## 2016-12-27 DIAGNOSIS — D649 Anemia, unspecified: Secondary | ICD-10-CM | POA: Diagnosis not present

## 2016-12-27 DIAGNOSIS — I10 Essential (primary) hypertension: Secondary | ICD-10-CM | POA: Diagnosis not present

## 2016-12-27 DIAGNOSIS — M1990 Unspecified osteoarthritis, unspecified site: Secondary | ICD-10-CM | POA: Diagnosis not present

## 2016-12-27 DIAGNOSIS — J441 Chronic obstructive pulmonary disease with (acute) exacerbation: Secondary | ICD-10-CM | POA: Diagnosis not present

## 2016-12-27 DIAGNOSIS — J440 Chronic obstructive pulmonary disease with acute lower respiratory infection: Secondary | ICD-10-CM | POA: Diagnosis not present

## 2016-12-27 DIAGNOSIS — J209 Acute bronchitis, unspecified: Secondary | ICD-10-CM | POA: Diagnosis not present

## 2016-12-27 DIAGNOSIS — M109 Gout, unspecified: Secondary | ICD-10-CM | POA: Diagnosis not present

## 2016-12-27 DIAGNOSIS — M25562 Pain in left knee: Secondary | ICD-10-CM | POA: Diagnosis not present

## 2016-12-27 DIAGNOSIS — I4510 Unspecified right bundle-branch block: Secondary | ICD-10-CM | POA: Diagnosis not present

## 2016-12-29 DIAGNOSIS — J440 Chronic obstructive pulmonary disease with acute lower respiratory infection: Secondary | ICD-10-CM | POA: Diagnosis not present

## 2016-12-29 DIAGNOSIS — I4510 Unspecified right bundle-branch block: Secondary | ICD-10-CM | POA: Diagnosis not present

## 2016-12-29 DIAGNOSIS — M1990 Unspecified osteoarthritis, unspecified site: Secondary | ICD-10-CM | POA: Diagnosis not present

## 2016-12-29 DIAGNOSIS — D649 Anemia, unspecified: Secondary | ICD-10-CM | POA: Diagnosis not present

## 2016-12-29 DIAGNOSIS — J441 Chronic obstructive pulmonary disease with (acute) exacerbation: Secondary | ICD-10-CM | POA: Diagnosis not present

## 2016-12-29 DIAGNOSIS — I10 Essential (primary) hypertension: Secondary | ICD-10-CM | POA: Diagnosis not present

## 2016-12-29 DIAGNOSIS — J209 Acute bronchitis, unspecified: Secondary | ICD-10-CM | POA: Diagnosis not present

## 2016-12-29 DIAGNOSIS — M109 Gout, unspecified: Secondary | ICD-10-CM | POA: Diagnosis not present

## 2016-12-29 DIAGNOSIS — M25562 Pain in left knee: Secondary | ICD-10-CM | POA: Diagnosis not present

## 2017-01-03 DIAGNOSIS — M109 Gout, unspecified: Secondary | ICD-10-CM | POA: Diagnosis not present

## 2017-01-03 DIAGNOSIS — J440 Chronic obstructive pulmonary disease with acute lower respiratory infection: Secondary | ICD-10-CM | POA: Diagnosis not present

## 2017-01-03 DIAGNOSIS — M1990 Unspecified osteoarthritis, unspecified site: Secondary | ICD-10-CM | POA: Diagnosis not present

## 2017-01-03 DIAGNOSIS — I10 Essential (primary) hypertension: Secondary | ICD-10-CM | POA: Diagnosis not present

## 2017-01-03 DIAGNOSIS — I4510 Unspecified right bundle-branch block: Secondary | ICD-10-CM | POA: Diagnosis not present

## 2017-01-03 DIAGNOSIS — J209 Acute bronchitis, unspecified: Secondary | ICD-10-CM | POA: Diagnosis not present

## 2017-01-03 DIAGNOSIS — D649 Anemia, unspecified: Secondary | ICD-10-CM | POA: Diagnosis not present

## 2017-01-03 DIAGNOSIS — M25562 Pain in left knee: Secondary | ICD-10-CM | POA: Diagnosis not present

## 2017-01-03 DIAGNOSIS — J441 Chronic obstructive pulmonary disease with (acute) exacerbation: Secondary | ICD-10-CM | POA: Diagnosis not present

## 2017-01-07 DIAGNOSIS — I4510 Unspecified right bundle-branch block: Secondary | ICD-10-CM | POA: Diagnosis not present

## 2017-01-07 DIAGNOSIS — J441 Chronic obstructive pulmonary disease with (acute) exacerbation: Secondary | ICD-10-CM | POA: Diagnosis not present

## 2017-01-07 DIAGNOSIS — M109 Gout, unspecified: Secondary | ICD-10-CM | POA: Diagnosis not present

## 2017-01-07 DIAGNOSIS — M1990 Unspecified osteoarthritis, unspecified site: Secondary | ICD-10-CM | POA: Diagnosis not present

## 2017-01-07 DIAGNOSIS — J209 Acute bronchitis, unspecified: Secondary | ICD-10-CM | POA: Diagnosis not present

## 2017-01-07 DIAGNOSIS — M25562 Pain in left knee: Secondary | ICD-10-CM | POA: Diagnosis not present

## 2017-01-07 DIAGNOSIS — J440 Chronic obstructive pulmonary disease with acute lower respiratory infection: Secondary | ICD-10-CM | POA: Diagnosis not present

## 2017-01-07 DIAGNOSIS — D649 Anemia, unspecified: Secondary | ICD-10-CM | POA: Diagnosis not present

## 2017-01-07 DIAGNOSIS — I10 Essential (primary) hypertension: Secondary | ICD-10-CM | POA: Diagnosis not present

## 2017-01-10 DIAGNOSIS — I4510 Unspecified right bundle-branch block: Secondary | ICD-10-CM | POA: Diagnosis not present

## 2017-01-10 DIAGNOSIS — M25562 Pain in left knee: Secondary | ICD-10-CM | POA: Diagnosis not present

## 2017-01-10 DIAGNOSIS — J209 Acute bronchitis, unspecified: Secondary | ICD-10-CM | POA: Diagnosis not present

## 2017-01-10 DIAGNOSIS — I10 Essential (primary) hypertension: Secondary | ICD-10-CM | POA: Diagnosis not present

## 2017-01-10 DIAGNOSIS — D649 Anemia, unspecified: Secondary | ICD-10-CM | POA: Diagnosis not present

## 2017-01-10 DIAGNOSIS — M109 Gout, unspecified: Secondary | ICD-10-CM | POA: Diagnosis not present

## 2017-01-10 DIAGNOSIS — J440 Chronic obstructive pulmonary disease with acute lower respiratory infection: Secondary | ICD-10-CM | POA: Diagnosis not present

## 2017-01-10 DIAGNOSIS — M1990 Unspecified osteoarthritis, unspecified site: Secondary | ICD-10-CM | POA: Diagnosis not present

## 2017-01-10 DIAGNOSIS — J441 Chronic obstructive pulmonary disease with (acute) exacerbation: Secondary | ICD-10-CM | POA: Diagnosis not present

## 2017-01-12 DIAGNOSIS — I480 Paroxysmal atrial fibrillation: Secondary | ICD-10-CM | POA: Diagnosis not present

## 2017-01-12 DIAGNOSIS — J449 Chronic obstructive pulmonary disease, unspecified: Secondary | ICD-10-CM | POA: Diagnosis not present

## 2017-01-12 DIAGNOSIS — I499 Cardiac arrhythmia, unspecified: Secondary | ICD-10-CM | POA: Diagnosis not present

## 2017-01-12 DIAGNOSIS — I10 Essential (primary) hypertension: Secondary | ICD-10-CM | POA: Diagnosis not present

## 2017-01-12 DIAGNOSIS — D472 Monoclonal gammopathy: Secondary | ICD-10-CM | POA: Diagnosis not present

## 2017-01-12 DIAGNOSIS — I4891 Unspecified atrial fibrillation: Secondary | ICD-10-CM | POA: Diagnosis not present

## 2017-01-12 DIAGNOSIS — E782 Mixed hyperlipidemia: Secondary | ICD-10-CM | POA: Diagnosis not present

## 2017-01-12 DIAGNOSIS — D3002 Benign neoplasm of left kidney: Secondary | ICD-10-CM | POA: Diagnosis not present

## 2017-01-12 DIAGNOSIS — I4510 Unspecified right bundle-branch block: Secondary | ICD-10-CM | POA: Diagnosis not present

## 2017-01-12 DIAGNOSIS — M159 Polyosteoarthritis, unspecified: Secondary | ICD-10-CM | POA: Diagnosis not present

## 2017-01-12 DIAGNOSIS — E039 Hypothyroidism, unspecified: Secondary | ICD-10-CM | POA: Diagnosis not present

## 2017-01-12 DIAGNOSIS — Z0000 Encounter for general adult medical examination without abnormal findings: Secondary | ICD-10-CM | POA: Diagnosis not present

## 2017-01-19 DIAGNOSIS — I4891 Unspecified atrial fibrillation: Secondary | ICD-10-CM | POA: Diagnosis not present

## 2017-01-19 DIAGNOSIS — R001 Bradycardia, unspecified: Secondary | ICD-10-CM | POA: Diagnosis not present

## 2017-01-27 DIAGNOSIS — Z7901 Long term (current) use of anticoagulants: Secondary | ICD-10-CM | POA: Diagnosis not present

## 2017-01-27 DIAGNOSIS — Z5181 Encounter for therapeutic drug level monitoring: Secondary | ICD-10-CM | POA: Diagnosis not present

## 2017-02-02 DIAGNOSIS — J439 Emphysema, unspecified: Secondary | ICD-10-CM | POA: Diagnosis not present

## 2017-02-07 DIAGNOSIS — Z5181 Encounter for therapeutic drug level monitoring: Secondary | ICD-10-CM | POA: Diagnosis not present

## 2017-02-07 DIAGNOSIS — I4891 Unspecified atrial fibrillation: Secondary | ICD-10-CM | POA: Diagnosis not present

## 2017-02-07 DIAGNOSIS — Z7901 Long term (current) use of anticoagulants: Secondary | ICD-10-CM | POA: Diagnosis not present

## 2017-02-21 DIAGNOSIS — H401131 Primary open-angle glaucoma, bilateral, mild stage: Secondary | ICD-10-CM | POA: Diagnosis not present

## 2017-03-07 DIAGNOSIS — I4891 Unspecified atrial fibrillation: Secondary | ICD-10-CM | POA: Diagnosis not present

## 2017-03-24 DIAGNOSIS — I10 Essential (primary) hypertension: Secondary | ICD-10-CM | POA: Diagnosis not present

## 2017-03-24 DIAGNOSIS — I4510 Unspecified right bundle-branch block: Secondary | ICD-10-CM | POA: Diagnosis not present

## 2017-03-24 DIAGNOSIS — E782 Mixed hyperlipidemia: Secondary | ICD-10-CM | POA: Diagnosis not present

## 2017-03-24 DIAGNOSIS — I480 Paroxysmal atrial fibrillation: Secondary | ICD-10-CM | POA: Diagnosis not present

## 2017-04-04 DIAGNOSIS — I4891 Unspecified atrial fibrillation: Secondary | ICD-10-CM | POA: Diagnosis not present

## 2017-05-02 DIAGNOSIS — I4891 Unspecified atrial fibrillation: Secondary | ICD-10-CM | POA: Diagnosis not present

## 2017-05-05 DIAGNOSIS — I10 Essential (primary) hypertension: Secondary | ICD-10-CM | POA: Diagnosis not present

## 2017-05-05 DIAGNOSIS — E782 Mixed hyperlipidemia: Secondary | ICD-10-CM | POA: Diagnosis not present

## 2017-05-05 DIAGNOSIS — E039 Hypothyroidism, unspecified: Secondary | ICD-10-CM | POA: Diagnosis not present

## 2017-05-12 DIAGNOSIS — Z23 Encounter for immunization: Secondary | ICD-10-CM | POA: Diagnosis not present

## 2017-05-12 DIAGNOSIS — J449 Chronic obstructive pulmonary disease, unspecified: Secondary | ICD-10-CM | POA: Diagnosis not present

## 2017-05-12 DIAGNOSIS — D472 Monoclonal gammopathy: Secondary | ICD-10-CM | POA: Diagnosis not present

## 2017-05-12 DIAGNOSIS — I10 Essential (primary) hypertension: Secondary | ICD-10-CM | POA: Diagnosis not present

## 2017-05-12 DIAGNOSIS — D649 Anemia, unspecified: Secondary | ICD-10-CM | POA: Diagnosis not present

## 2017-05-12 DIAGNOSIS — I480 Paroxysmal atrial fibrillation: Secondary | ICD-10-CM | POA: Diagnosis not present

## 2017-05-12 DIAGNOSIS — E782 Mixed hyperlipidemia: Secondary | ICD-10-CM | POA: Diagnosis not present

## 2017-05-12 DIAGNOSIS — E039 Hypothyroidism, unspecified: Secondary | ICD-10-CM | POA: Diagnosis not present

## 2017-05-30 DIAGNOSIS — D649 Anemia, unspecified: Secondary | ICD-10-CM | POA: Diagnosis not present

## 2017-05-30 DIAGNOSIS — I4891 Unspecified atrial fibrillation: Secondary | ICD-10-CM | POA: Diagnosis not present

## 2017-06-20 ENCOUNTER — Inpatient Hospital Stay: Payer: Medicare HMO | Attending: Oncology

## 2017-06-20 DIAGNOSIS — D472 Monoclonal gammopathy: Secondary | ICD-10-CM | POA: Insufficient documentation

## 2017-06-20 DIAGNOSIS — J449 Chronic obstructive pulmonary disease, unspecified: Secondary | ICD-10-CM | POA: Diagnosis not present

## 2017-06-20 DIAGNOSIS — I10 Essential (primary) hypertension: Secondary | ICD-10-CM | POA: Insufficient documentation

## 2017-06-20 DIAGNOSIS — N400 Enlarged prostate without lower urinary tract symptoms: Secondary | ICD-10-CM | POA: Insufficient documentation

## 2017-06-20 DIAGNOSIS — E785 Hyperlipidemia, unspecified: Secondary | ICD-10-CM | POA: Insufficient documentation

## 2017-06-20 DIAGNOSIS — K219 Gastro-esophageal reflux disease without esophagitis: Secondary | ICD-10-CM | POA: Insufficient documentation

## 2017-06-20 DIAGNOSIS — Z79899 Other long term (current) drug therapy: Secondary | ICD-10-CM | POA: Insufficient documentation

## 2017-06-20 DIAGNOSIS — H409 Unspecified glaucoma: Secondary | ICD-10-CM | POA: Insufficient documentation

## 2017-06-20 DIAGNOSIS — Z7952 Long term (current) use of systemic steroids: Secondary | ICD-10-CM | POA: Insufficient documentation

## 2017-06-20 DIAGNOSIS — Z7982 Long term (current) use of aspirin: Secondary | ICD-10-CM | POA: Diagnosis not present

## 2017-06-20 DIAGNOSIS — D3002 Benign neoplasm of left kidney: Secondary | ICD-10-CM | POA: Diagnosis not present

## 2017-06-20 LAB — CBC WITH DIFFERENTIAL/PLATELET
Basophils Absolute: 0 10*3/uL (ref 0–0.1)
Basophils Relative: 0 %
EOS ABS: 0.3 10*3/uL (ref 0–0.7)
EOS PCT: 8 %
HCT: 40.9 % (ref 40.0–52.0)
Hemoglobin: 13.7 g/dL (ref 13.0–18.0)
LYMPHS ABS: 1.3 10*3/uL (ref 1.0–3.6)
Lymphocytes Relative: 34 %
MCH: 33.6 pg (ref 26.0–34.0)
MCHC: 33.5 g/dL (ref 32.0–36.0)
MCV: 100.2 fL — ABNORMAL HIGH (ref 80.0–100.0)
MONOS PCT: 9 %
Monocytes Absolute: 0.3 10*3/uL (ref 0.2–1.0)
Neutro Abs: 1.9 10*3/uL (ref 1.4–6.5)
Neutrophils Relative %: 49 %
Platelets: 176 10*3/uL (ref 150–440)
RBC: 4.08 MIL/uL — ABNORMAL LOW (ref 4.40–5.90)
RDW: 12.9 % (ref 11.5–14.5)
WBC: 3.8 10*3/uL (ref 3.8–10.6)

## 2017-06-20 LAB — BASIC METABOLIC PANEL
ANION GAP: 9 (ref 5–15)
BUN: 24 mg/dL — ABNORMAL HIGH (ref 6–20)
CALCIUM: 9.2 mg/dL (ref 8.9–10.3)
CO2: 27 mmol/L (ref 22–32)
CREATININE: 0.89 mg/dL (ref 0.61–1.24)
Chloride: 102 mmol/L (ref 101–111)
Glucose, Bld: 96 mg/dL (ref 65–99)
Potassium: 4.2 mmol/L (ref 3.5–5.1)
SODIUM: 138 mmol/L (ref 135–145)

## 2017-06-21 LAB — IGG, IGA, IGM
IgA: 226 mg/dL (ref 61–437)
IgG (Immunoglobin G), Serum: 1615 mg/dL — ABNORMAL HIGH (ref 700–1600)
IgM (Immunoglobulin M), Srm: 61 mg/dL (ref 15–143)

## 2017-06-21 LAB — PROTEIN ELECTROPHORESIS, SERUM
A/G Ratio: 1.2 (ref 0.7–1.7)
Albumin ELP: 4.1 g/dL (ref 2.9–4.4)
Alpha-1-Globulin: 0.2 g/dL (ref 0.0–0.4)
Alpha-2-Globulin: 0.7 g/dL (ref 0.4–1.0)
Beta Globulin: 0.9 g/dL (ref 0.7–1.3)
Gamma Globulin: 1.4 g/dL (ref 0.4–1.8)
Globulin, Total: 3.3 g/dL (ref 2.2–3.9)
M-Spike, %: 0.6 g/dL — ABNORMAL HIGH
Total Protein ELP: 7.4 g/dL (ref 6.0–8.5)

## 2017-06-21 LAB — KAPPA/LAMBDA LIGHT CHAINS
Kappa free light chain: 31.6 mg/L — ABNORMAL HIGH (ref 3.3–19.4)
Kappa, lambda light chain ratio: 0.53 (ref 0.26–1.65)
Lambda free light chains: 60 mg/L — ABNORMAL HIGH (ref 5.7–26.3)

## 2017-06-26 NOTE — Progress Notes (Signed)
[name]  [phone] [phone]  ID: Gabriel Delacruz OB: 09/30/1927  MR#: [id number]  KYH#:[id number]  Patient Care Team: Marinda Elk, MD as PCP - General (Physician Assistant)  CHIEF COMPLAINT: MGUS  INTERVAL HISTORY: Patient returns to clinic today for repeat laboratory work and routine six-month evaluation.  He continues to feel well and is asymptomatic. He denies any recent fevers or illnesses.  He denies any weakness or fatigue.  He denies any pain.  He has no neurologic complaints.  He has a good appetite and denies weight loss.  He has no chest pain or shortness of breath.  He denies any nausea, vomiting, constipation, or diarrhea.  He has no urinary complaints.  Patient offers no specific complaints today.    REVIEW OF SYSTEMS:   Review of Systems  Constitutional: Negative.  Negative for fever, malaise/fatigue and weight loss.  Respiratory: Negative.  Negative for cough and shortness of breath.   Cardiovascular: Negative.  Negative for chest pain and leg swelling.  Gastrointestinal: Negative.  Negative for abdominal pain.  Genitourinary: Negative.   Musculoskeletal: Negative.   Skin: Negative.  Negative for rash.  Neurological: Negative.  Negative for sensory change and weakness.  Psychiatric/Behavioral: Negative.  The patient is not nervous/anxious.     As per HPI. Otherwise, a complete review of systems is negative.  PAST MEDICAL HISTORY: Glaucoma, hypertension, hyperlipidemia, BPH, perirectal abscess, COPD, GERD  PAST SURGICAL HISTORY: left renal oncocytoma s/p CT-guided percutaneous renal cryoablation, hypothyroidism.  Tonsillectomy, cervical and lumbar disc surgery.  FAMILY HISTORY: Reviewed and unchanged. No reported history of malignancy or chronic disease.      ADVANCED DIRECTIVES:    HEALTH MAINTENANCE: Social History   Tobacco Use  . Smoking status: Never Smoker  . Smokeless tobacco: Never Used    Substance Use Topics  . Alcohol use: Not on file  . Drug use: Not on file     Colonoscopy:  PAP:  Bone density:  Lipid panel:  No Known Allergies  Current Outpatient Medications  Medication Sig Dispense Refill  . acetaminophen (TYLENOL) 325 MG tablet Take 650 mg by mouth every 6 (six) hours as needed.     Marland Kitchen albuterol (PROAIR HFA) 108 (90 BASE) MCG/ACT inhaler Inhale 2 puffs into the lungs every 6 (six) hours as needed.     Marland Kitchen amLODipine (NORVASC) 5 MG tablet Take 5 mg by mouth daily.     Marland Kitchen aspirin EC 81 MG tablet Take 81 mg by mouth daily.     . dorzolamide-timolol (COSOPT) 22.3-6.8 MG/ML ophthalmic solution 1 drop Two (2) times a day as needed    . Fluticasone-Salmeterol (ADVAIR DISKUS) 250-50 MCG/DOSE AEPB Inhale 1 puff into the lungs 2 (two) times daily.     . furosemide (LASIX) 20 MG tablet Take 20 mg by mouth daily.     Marland Kitchen losartan-hydrochlorothiazide (HYZAAR) 100-12.5 MG tablet Take 1 tablet by mouth daily.     . multivitamin-iron-minerals-folic acid (CENTRUM) chewable tablet Chew 1 tablet by mouth daily.     . nebivolol (BYSTOLIC) 10 MG tablet Take 10 mg by mouth daily.     . potassium chloride (K-DUR) 10 MEQ tablet Take 10 mEq by mouth daily.     . predniSONE (DELTASONE) 20 MG tablet Take 2 tablets (40 mg total) by mouth daily. (Patient not taking: Reported on 06/27/2017) 8 tablet 0   No current facility-administered medications for this visit.     OBJECTIVE: Vitals:   06/27/17 1001  BP:  131/76  Pulse: (!) 58  Resp: 18  Temp: 97.8 F (36.6 C)     Body mass index is 28.74 kg/m.    ECOG FS:0 - Asymptomatic  General: Well-developed, well-nourished, no acute distress. Eyes: Pink conjunctiva, anicteric sclera. Lungs: Clear to auscultation bilaterally. Heart: Regular rate and rhythm. No rubs, murmurs, or gallops. Abdomen: Soft, nontender, nondistended. No organomegaly noted, normoactive bowel sounds. Musculoskeletal: No edema, cyanosis, or clubbing. Neuro: Alert,  answering all questions appropriately. Cranial nerves grossly intact. Skin: No rashes or petechiae noted. Psych: Normal affect.   LAB RESULTS:  Lab Results  Component Value Date   NA 138 06/20/2017   K 4.2 06/20/2017   CL 102 06/20/2017   CO2 27 06/20/2017   GLUCOSE 96 06/20/2017   BUN 24 (H) 06/20/2017   CREATININE 0.89 06/20/2017   CALCIUM 9.2 06/20/2017   PROT 8.3 (H) 09/30/2011   ALBUMIN 3.8 09/30/2011   AST 22 09/30/2011   ALT 31 09/30/2011   ALKPHOS 94 09/30/2011   BILITOT 0.6 09/30/2011   GFRNONAA >60 06/20/2017   GFRAA >60 06/20/2017    Lab Results  Component Value Date   WBC 3.8 06/20/2017   NEUTROABS 1.9 06/20/2017   HGB 13.7 06/20/2017   HCT 40.9 06/20/2017   MCV 100.2 (H) 06/20/2017   PLT 176 06/20/2017   Lab Results  Component Value Date   TOTALPROTELP 7.4 06/20/2017   ALBUMINELP 4.1 06/20/2017   A1GS 0.2 06/20/2017   A2GS 0.7 06/20/2017   BETS 0.9 06/20/2017   GAMS 1.4 06/20/2017   MSPIKE 0.6 (H) 06/20/2017   SPEI Comment 06/20/2017     STUDIES: No results found.  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  PLAN:    1. MGUS: Patient's M spike has been essentially unchanged at~ 0.6 - 0.7 for greater than 5 years.  Bone marrow biopsy on October 23, 2009 revealed a plasma cell dyscrasia with 10% abnormal plasma cells.  Patient has no evidence of endorgan damage and is asymptomatic.  No intervention is needed at this time, continue simple observation.  Return to clinic in 6 months with repeat laboratory work only and then in 1 year for laboratory work and further evaluation.   2.  Left renal Oncocytoma: Followup and treatment per Dr. Jacqlyn Larsen.  Patient states he may consider discontinuing his follow-up with urology in the future. 3.  Hypertension: Patient's blood pressure is within normal limits today. Continue current medications.  Patient expressed understanding and was in agreement with this plan. He also understands that He can call clinic at  any time with any questions, concerns, or complaints.    Gabriel Huger, MD   06/27/2017 10:40 AM

## 2017-06-27 ENCOUNTER — Inpatient Hospital Stay: Payer: Medicare HMO | Admitting: Oncology

## 2017-06-27 VITALS — BP 131/76 | HR 58 | Temp 97.8°F | Resp 18 | Wt 189.0 lb

## 2017-06-27 DIAGNOSIS — H409 Unspecified glaucoma: Secondary | ICD-10-CM | POA: Diagnosis not present

## 2017-06-27 DIAGNOSIS — D3002 Benign neoplasm of left kidney: Secondary | ICD-10-CM

## 2017-06-27 DIAGNOSIS — Z7952 Long term (current) use of systemic steroids: Secondary | ICD-10-CM

## 2017-06-27 DIAGNOSIS — K219 Gastro-esophageal reflux disease without esophagitis: Secondary | ICD-10-CM | POA: Diagnosis not present

## 2017-06-27 DIAGNOSIS — Z7982 Long term (current) use of aspirin: Secondary | ICD-10-CM | POA: Diagnosis not present

## 2017-06-27 DIAGNOSIS — D472 Monoclonal gammopathy: Secondary | ICD-10-CM

## 2017-06-27 DIAGNOSIS — I10 Essential (primary) hypertension: Secondary | ICD-10-CM | POA: Diagnosis not present

## 2017-06-27 DIAGNOSIS — E785 Hyperlipidemia, unspecified: Secondary | ICD-10-CM | POA: Diagnosis not present

## 2017-06-27 DIAGNOSIS — N400 Enlarged prostate without lower urinary tract symptoms: Secondary | ICD-10-CM

## 2017-06-27 DIAGNOSIS — Z79899 Other long term (current) drug therapy: Secondary | ICD-10-CM | POA: Diagnosis not present

## 2017-06-27 DIAGNOSIS — J449 Chronic obstructive pulmonary disease, unspecified: Secondary | ICD-10-CM

## 2017-06-27 DIAGNOSIS — I4891 Unspecified atrial fibrillation: Secondary | ICD-10-CM | POA: Diagnosis not present

## 2017-06-27 NOTE — Progress Notes (Signed)
Pt in for follow up today.  Reports some fatigue along with chest congestion with yellow sputum.

## 2017-08-03 DIAGNOSIS — J209 Acute bronchitis, unspecified: Secondary | ICD-10-CM | POA: Diagnosis not present

## 2017-08-03 DIAGNOSIS — I4891 Unspecified atrial fibrillation: Secondary | ICD-10-CM | POA: Diagnosis not present

## 2017-08-03 DIAGNOSIS — J441 Chronic obstructive pulmonary disease with (acute) exacerbation: Secondary | ICD-10-CM | POA: Diagnosis not present

## 2017-08-22 DIAGNOSIS — H02056 Trichiasis without entropian left eye, unspecified eyelid: Secondary | ICD-10-CM | POA: Diagnosis not present

## 2017-08-22 DIAGNOSIS — H401131 Primary open-angle glaucoma, bilateral, mild stage: Secondary | ICD-10-CM | POA: Diagnosis not present

## 2017-08-31 DIAGNOSIS — E039 Hypothyroidism, unspecified: Secondary | ICD-10-CM | POA: Diagnosis not present

## 2017-08-31 DIAGNOSIS — I10 Essential (primary) hypertension: Secondary | ICD-10-CM | POA: Diagnosis not present

## 2017-08-31 DIAGNOSIS — I4891 Unspecified atrial fibrillation: Secondary | ICD-10-CM | POA: Diagnosis not present

## 2017-08-31 DIAGNOSIS — E782 Mixed hyperlipidemia: Secondary | ICD-10-CM | POA: Diagnosis not present

## 2017-09-12 DIAGNOSIS — J449 Chronic obstructive pulmonary disease, unspecified: Secondary | ICD-10-CM | POA: Diagnosis not present

## 2017-09-12 DIAGNOSIS — I4891 Unspecified atrial fibrillation: Secondary | ICD-10-CM | POA: Diagnosis not present

## 2017-09-12 DIAGNOSIS — D472 Monoclonal gammopathy: Secondary | ICD-10-CM | POA: Diagnosis not present

## 2017-09-12 DIAGNOSIS — M159 Polyosteoarthritis, unspecified: Secondary | ICD-10-CM | POA: Diagnosis not present

## 2017-09-12 DIAGNOSIS — D3002 Benign neoplasm of left kidney: Secondary | ICD-10-CM | POA: Diagnosis not present

## 2017-09-12 DIAGNOSIS — E039 Hypothyroidism, unspecified: Secondary | ICD-10-CM | POA: Diagnosis not present

## 2017-09-12 DIAGNOSIS — I10 Essential (primary) hypertension: Secondary | ICD-10-CM | POA: Diagnosis not present

## 2017-09-12 DIAGNOSIS — E782 Mixed hyperlipidemia: Secondary | ICD-10-CM | POA: Diagnosis not present

## 2017-09-16 DIAGNOSIS — R05 Cough: Secondary | ICD-10-CM | POA: Diagnosis not present

## 2017-09-16 DIAGNOSIS — J449 Chronic obstructive pulmonary disease, unspecified: Secondary | ICD-10-CM | POA: Diagnosis not present

## 2017-09-20 DIAGNOSIS — R9389 Abnormal findings on diagnostic imaging of other specified body structures: Secondary | ICD-10-CM | POA: Diagnosis not present

## 2017-09-20 DIAGNOSIS — J984 Other disorders of lung: Secondary | ICD-10-CM | POA: Diagnosis not present

## 2017-09-28 DIAGNOSIS — I4891 Unspecified atrial fibrillation: Secondary | ICD-10-CM | POA: Diagnosis not present

## 2017-10-26 DIAGNOSIS — I4891 Unspecified atrial fibrillation: Secondary | ICD-10-CM | POA: Diagnosis not present

## 2017-11-23 DIAGNOSIS — I4891 Unspecified atrial fibrillation: Secondary | ICD-10-CM | POA: Diagnosis not present

## 2017-12-09 DIAGNOSIS — I4891 Unspecified atrial fibrillation: Secondary | ICD-10-CM | POA: Diagnosis not present

## 2017-12-09 DIAGNOSIS — R600 Localized edema: Secondary | ICD-10-CM | POA: Diagnosis not present

## 2017-12-21 DIAGNOSIS — R600 Localized edema: Secondary | ICD-10-CM | POA: Diagnosis not present

## 2017-12-21 DIAGNOSIS — I10 Essential (primary) hypertension: Secondary | ICD-10-CM | POA: Diagnosis not present

## 2017-12-21 DIAGNOSIS — E782 Mixed hyperlipidemia: Secondary | ICD-10-CM | POA: Diagnosis not present

## 2017-12-21 DIAGNOSIS — I4891 Unspecified atrial fibrillation: Secondary | ICD-10-CM | POA: Diagnosis not present

## 2017-12-26 ENCOUNTER — Inpatient Hospital Stay: Payer: Medicare HMO

## 2017-12-28 ENCOUNTER — Inpatient Hospital Stay: Payer: Medicare HMO | Attending: Oncology

## 2017-12-28 DIAGNOSIS — D472 Monoclonal gammopathy: Secondary | ICD-10-CM

## 2017-12-28 DIAGNOSIS — Z7901 Long term (current) use of anticoagulants: Secondary | ICD-10-CM | POA: Diagnosis not present

## 2017-12-28 DIAGNOSIS — Z5181 Encounter for therapeutic drug level monitoring: Secondary | ICD-10-CM | POA: Diagnosis not present

## 2017-12-28 LAB — BASIC METABOLIC PANEL
Anion gap: 11 (ref 5–15)
BUN: 32 mg/dL — ABNORMAL HIGH (ref 6–20)
CALCIUM: 9.8 mg/dL (ref 8.9–10.3)
CO2: 27 mmol/L (ref 22–32)
CREATININE: 0.99 mg/dL (ref 0.61–1.24)
Chloride: 97 mmol/L — ABNORMAL LOW (ref 101–111)
GFR calc Af Amer: >60 mL/min (ref >60)
GFR calc non Af Amer: >60 mL/min (ref >60)
GLUCOSE: 99 mg/dL (ref 65–99)
Potassium: 3.8 mmol/L (ref 3.5–5.1)
Sodium: 135 mmol/L (ref 135–145)

## 2017-12-28 LAB — CBC WITH DIFFERENTIAL/PLATELET
BASOS PCT: 1 %
Basophils Absolute: 0 10*3/uL (ref 0–0.1)
EOS ABS: 0.2 10*3/uL (ref 0–0.7)
Eosinophils Relative: 6 %
HCT: 42.6 % (ref 40.0–52.0)
Hemoglobin: 14.9 g/dL (ref 13.0–18.0)
Lymphocytes Relative: 39 %
Lymphs Abs: 1.5 10*3/uL (ref 1.0–3.6)
MCH: 35 pg — ABNORMAL HIGH (ref 26.0–34.0)
MCHC: 35 g/dL (ref 32.0–36.0)
MCV: 100.1 fL — ABNORMAL HIGH (ref 80.0–100.0)
MONO ABS: 0.3 10*3/uL (ref 0.2–1.0)
MONOS PCT: 8 %
Neutro Abs: 1.8 10*3/uL (ref 1.4–6.5)
Neutrophils Relative %: 46 %
Platelets: 185 10*3/uL (ref 150–440)
RBC: 4.26 MIL/uL — ABNORMAL LOW (ref 4.40–5.90)
RDW: 12.6 % (ref 11.5–14.5)
WBC: 3.8 10*3/uL (ref 3.8–10.6)

## 2017-12-29 LAB — KAPPA/LAMBDA LIGHT CHAINS
KAPPA FREE LGHT CHN: 33.5 mg/L — AB (ref 3.3–19.4)
Kappa, lambda light chain ratio: 0.55 (ref 0.26–1.65)
LAMDA FREE LIGHT CHAINS: 61 mg/L — AB (ref 5.7–26.3)

## 2017-12-29 LAB — IGG, IGA, IGM
IGA: 241 mg/dL (ref 61–437)
IGG (IMMUNOGLOBIN G), SERUM: 1569 mg/dL (ref 700–1600)
IgM (Immunoglobulin M), Srm: 60 mg/dL (ref 15–143)

## 2017-12-30 LAB — PROTEIN ELECTROPHORESIS, SERUM
A/G RATIO SPE: 1.2 (ref 0.7–1.7)
ALBUMIN ELP: 3.9 g/dL (ref 2.9–4.4)
ALPHA-1-GLOBULIN: 0.2 g/dL (ref 0.0–0.4)
ALPHA-2-GLOBULIN: 0.7 g/dL (ref 0.4–1.0)
BETA GLOBULIN: 1 g/dL (ref 0.7–1.3)
GAMMA GLOBULIN: 1.4 g/dL (ref 0.4–1.8)
Globulin, Total: 3.3 g/dL (ref 2.2–3.9)
M-Spike, %: 0.6 g/dL — ABNORMAL HIGH
Total Protein ELP: 7.2 g/dL (ref 6.0–8.5)

## 2018-01-25 DIAGNOSIS — I4891 Unspecified atrial fibrillation: Secondary | ICD-10-CM | POA: Diagnosis not present

## 2018-02-02 DIAGNOSIS — Z5181 Encounter for therapeutic drug level monitoring: Secondary | ICD-10-CM | POA: Diagnosis not present

## 2018-02-02 DIAGNOSIS — Z7901 Long term (current) use of anticoagulants: Secondary | ICD-10-CM | POA: Diagnosis not present

## 2018-02-20 DIAGNOSIS — H02054 Trichiasis without entropian left upper eyelid: Secondary | ICD-10-CM | POA: Diagnosis not present

## 2018-02-20 DIAGNOSIS — H401131 Primary open-angle glaucoma, bilateral, mild stage: Secondary | ICD-10-CM | POA: Diagnosis not present

## 2018-03-02 DIAGNOSIS — Z5181 Encounter for therapeutic drug level monitoring: Secondary | ICD-10-CM | POA: Diagnosis not present

## 2018-03-02 DIAGNOSIS — Z7901 Long term (current) use of anticoagulants: Secondary | ICD-10-CM | POA: Diagnosis not present

## 2018-04-04 DIAGNOSIS — Z7901 Long term (current) use of anticoagulants: Secondary | ICD-10-CM | POA: Diagnosis not present

## 2018-04-04 DIAGNOSIS — I10 Essential (primary) hypertension: Secondary | ICD-10-CM | POA: Diagnosis not present

## 2018-04-04 DIAGNOSIS — Z5181 Encounter for therapeutic drug level monitoring: Secondary | ICD-10-CM | POA: Diagnosis not present

## 2018-04-04 DIAGNOSIS — E782 Mixed hyperlipidemia: Secondary | ICD-10-CM | POA: Diagnosis not present

## 2018-04-04 DIAGNOSIS — Z125 Encounter for screening for malignant neoplasm of prostate: Secondary | ICD-10-CM | POA: Diagnosis not present

## 2018-04-04 DIAGNOSIS — E039 Hypothyroidism, unspecified: Secondary | ICD-10-CM | POA: Diagnosis not present

## 2018-04-04 DIAGNOSIS — D3002 Benign neoplasm of left kidney: Secondary | ICD-10-CM | POA: Diagnosis not present

## 2018-04-13 DIAGNOSIS — D472 Monoclonal gammopathy: Secondary | ICD-10-CM | POA: Diagnosis not present

## 2018-04-13 DIAGNOSIS — Z0000 Encounter for general adult medical examination without abnormal findings: Secondary | ICD-10-CM | POA: Diagnosis not present

## 2018-04-13 DIAGNOSIS — E782 Mixed hyperlipidemia: Secondary | ICD-10-CM | POA: Diagnosis not present

## 2018-04-13 DIAGNOSIS — E039 Hypothyroidism, unspecified: Secondary | ICD-10-CM | POA: Diagnosis not present

## 2018-04-13 DIAGNOSIS — D3002 Benign neoplasm of left kidney: Secondary | ICD-10-CM | POA: Diagnosis not present

## 2018-04-13 DIAGNOSIS — I10 Essential (primary) hypertension: Secondary | ICD-10-CM | POA: Diagnosis not present

## 2018-04-13 DIAGNOSIS — I480 Paroxysmal atrial fibrillation: Secondary | ICD-10-CM | POA: Diagnosis not present

## 2018-04-13 DIAGNOSIS — J449 Chronic obstructive pulmonary disease, unspecified: Secondary | ICD-10-CM | POA: Diagnosis not present

## 2018-04-13 DIAGNOSIS — M159 Polyosteoarthritis, unspecified: Secondary | ICD-10-CM | POA: Diagnosis not present

## 2018-04-26 DIAGNOSIS — J449 Chronic obstructive pulmonary disease, unspecified: Secondary | ICD-10-CM | POA: Diagnosis not present

## 2018-05-08 DIAGNOSIS — Z7901 Long term (current) use of anticoagulants: Secondary | ICD-10-CM | POA: Diagnosis not present

## 2018-06-08 DIAGNOSIS — Z23 Encounter for immunization: Secondary | ICD-10-CM | POA: Diagnosis not present

## 2018-06-08 DIAGNOSIS — Z7901 Long term (current) use of anticoagulants: Secondary | ICD-10-CM | POA: Diagnosis not present

## 2018-06-27 ENCOUNTER — Other Ambulatory Visit: Payer: Self-pay

## 2018-06-27 ENCOUNTER — Inpatient Hospital Stay: Payer: Medicare HMO | Attending: Oncology

## 2018-06-27 DIAGNOSIS — Z86018 Personal history of other benign neoplasm: Secondary | ICD-10-CM | POA: Insufficient documentation

## 2018-06-27 DIAGNOSIS — E785 Hyperlipidemia, unspecified: Secondary | ICD-10-CM | POA: Insufficient documentation

## 2018-06-27 DIAGNOSIS — D472 Monoclonal gammopathy: Secondary | ICD-10-CM | POA: Insufficient documentation

## 2018-06-27 DIAGNOSIS — I10 Essential (primary) hypertension: Secondary | ICD-10-CM | POA: Insufficient documentation

## 2018-06-27 DIAGNOSIS — J449 Chronic obstructive pulmonary disease, unspecified: Secondary | ICD-10-CM | POA: Insufficient documentation

## 2018-06-27 DIAGNOSIS — H409 Unspecified glaucoma: Secondary | ICD-10-CM | POA: Insufficient documentation

## 2018-06-27 LAB — CBC WITH DIFFERENTIAL/PLATELET
Abs Immature Granulocytes: 0.01 10*3/uL (ref 0.00–0.07)
Basophils Absolute: 0 10*3/uL (ref 0.0–0.1)
Basophils Relative: 1 %
EOS PCT: 6 %
Eosinophils Absolute: 0.2 10*3/uL (ref 0.0–0.5)
HEMATOCRIT: 38.4 % — AB (ref 39.0–52.0)
Hemoglobin: 12.4 g/dL — ABNORMAL LOW (ref 13.0–17.0)
Immature Granulocytes: 0 %
LYMPHS ABS: 1.5 10*3/uL (ref 0.7–4.0)
LYMPHS PCT: 38 %
MCH: 32.9 pg (ref 26.0–34.0)
MCHC: 32.3 g/dL (ref 30.0–36.0)
MCV: 101.9 fL — AB (ref 80.0–100.0)
MONOS PCT: 7 %
Monocytes Absolute: 0.3 10*3/uL (ref 0.1–1.0)
NEUTROS ABS: 1.9 10*3/uL (ref 1.7–7.7)
Neutrophils Relative %: 48 %
Platelets: 176 10*3/uL (ref 150–400)
RBC: 3.77 MIL/uL — ABNORMAL LOW (ref 4.22–5.81)
RDW: 12 % (ref 11.5–15.5)
WBC: 4 10*3/uL (ref 4.0–10.5)
nRBC: 0 % (ref 0.0–0.2)

## 2018-06-27 LAB — BASIC METABOLIC PANEL
Anion gap: 7 (ref 5–15)
BUN: 29 mg/dL — ABNORMAL HIGH (ref 8–23)
CHLORIDE: 101 mmol/L (ref 98–111)
CO2: 29 mmol/L (ref 22–32)
CREATININE: 0.89 mg/dL (ref 0.61–1.24)
Calcium: 9.4 mg/dL (ref 8.9–10.3)
GFR calc non Af Amer: >60 mL/min (ref >60)
Glucose, Bld: 95 mg/dL (ref 70–99)
POTASSIUM: 4.1 mmol/L (ref 3.5–5.1)
SODIUM: 137 mmol/L (ref 135–145)

## 2018-06-28 LAB — PROTEIN ELECTROPHORESIS, SERUM
A/G Ratio: 1.1 (ref 0.7–1.7)
ALPHA-1-GLOBULIN: 0.2 g/dL (ref 0.0–0.4)
ALPHA-2-GLOBULIN: 0.7 g/dL (ref 0.4–1.0)
Albumin ELP: 3.9 g/dL (ref 2.9–4.4)
Beta Globulin: 0.9 g/dL (ref 0.7–1.3)
GAMMA GLOBULIN: 1.5 g/dL (ref 0.4–1.8)
GLOBULIN, TOTAL: 3.4 g/dL (ref 2.2–3.9)
M-SPIKE, %: 0.7 g/dL — AB
Total Protein ELP: 7.3 g/dL (ref 6.0–8.5)

## 2018-06-28 LAB — IGG, IGA, IGM
IGA: 225 mg/dL (ref 61–437)
IgG (Immunoglobin G), Serum: 1526 mg/dL (ref 700–1600)
IgM (Immunoglobulin M), Srm: 56 mg/dL (ref 15–143)

## 2018-06-28 LAB — KAPPA/LAMBDA LIGHT CHAINS
KAPPA, LAMDA LIGHT CHAIN RATIO: 0.47 (ref 0.26–1.65)
Kappa free light chain: 34.2 mg/L — ABNORMAL HIGH (ref 3.3–19.4)
Lambda free light chains: 72.2 mg/L — ABNORMAL HIGH (ref 5.7–26.3)

## 2018-07-01 NOTE — Progress Notes (Signed)
[place]  Telephone:(336) [phone] Fax:(336) [phone]  ID: Gabriel Delacruz OB: Apr 29, 1928  MR#: [id number]  NID#:[id number]  Patient Care Team: Marinda Elk, MD as PCP - General (Physician Assistant)  CHIEF COMPLAINT: MGUS  INTERVAL HISTORY: Patient returns clinic today for repeat laboratory work and routine six-month evaluation.  He continues to feel well and remains asymptomatic.  He has no neurologic complaints. He denies any weakness or fatigue.  He denies any pain. He has a good appetite and denies weight loss.  He has no chest pain or shortness of breath.  He denies any nausea, vomiting, constipation, or diarrhea.  He has no urinary complaints.  Patient feels at his baseline offers no specific complaints today.   REVIEW OF SYSTEMS:   Review of Systems  Constitutional: Negative.  Negative for fever, malaise/fatigue and weight loss.  Respiratory: Negative.  Negative for cough and shortness of breath.   Cardiovascular: Negative.  Negative for chest pain and leg swelling.  Gastrointestinal: Negative.  Negative for abdominal pain.  Genitourinary: Negative.  Negative for dysuria and hematuria.  Musculoskeletal: Negative.  Negative for back pain.  Skin: Negative.  Negative for rash.  Neurological: Negative.  Negative for sensory change, focal weakness, weakness and headaches.  Psychiatric/Behavioral: Negative.  The patient is not nervous/anxious.     As per HPI. Otherwise, a complete review of systems is negative.  PAST MEDICAL HISTORY: Glaucoma, hypertension, hyperlipidemia, BPH, perirectal abscess, COPD, GERD  PAST SURGICAL HISTORY: left renal oncocytoma s/p CT-guided percutaneous renal cryoablation, hypothyroidism.  Tonsillectomy, cervical and lumbar disc surgery.  FAMILY HISTORY: Reviewed and unchanged. No reported history of malignancy or chronic disease.      ADVANCED DIRECTIVES:    HEALTH MAINTENANCE: Social History   Tobacco Use  .  Smoking status: Never Smoker  . Smokeless tobacco: Never Used  Substance Use Topics  . Alcohol use: Not on file  . Drug use: Not on file     Colonoscopy:  PAP:  Bone density:  Lipid panel:  No Known Allergies  Current Outpatient Medications  Medication Sig Dispense Refill  . acetaminophen (TYLENOL) 325 MG tablet Take 650 mg by mouth every 6 (six) hours as needed.     Marland Kitchen albuterol (PROAIR HFA) 108 (90 BASE) MCG/ACT inhaler Inhale 2 puffs into the lungs every 6 (six) hours as needed.     Marland Kitchen amLODipine (NORVASC) 5 MG tablet Take 5 mg by mouth daily.     Marland Kitchen aspirin EC 81 MG tablet Take 81 mg by mouth daily.     . dorzolamide-timolol (COSOPT) 22.3-6.8 MG/ML ophthalmic solution 1 drop Two (2) times a day as needed    . Fluticasone-Salmeterol (ADVAIR DISKUS) 250-50 MCG/DOSE AEPB Inhale 1 puff into the lungs 2 (two) times daily.     . furosemide (LASIX) 20 MG tablet Take 20 mg by mouth daily.     Marland Kitchen losartan-hydrochlorothiazide (HYZAAR) 100-12.5 MG tablet Take 1 tablet by mouth daily.     . multivitamin-iron-minerals-folic acid (CENTRUM) chewable tablet Chew 1 tablet by mouth daily.     . nebivolol (BYSTOLIC) 10 MG tablet Take 10 mg by mouth daily.     . potassium chloride (K-DUR) 10 MEQ tablet Take 10 mEq by mouth daily.     . predniSONE (DELTASONE) 20 MG tablet Take 2 tablets (40 mg total) by mouth daily. 8 tablet 0  . Travoprost, BAK Free, (TRAVATAN Z) 0.004 % SOLN ophthalmic solution PLACE 1 DROP INTO BOTH EYES ONCE DAILY    .  warfarin (COUMADIN) 6 MG tablet TAKE 1 TABLET EVERY DAY     No current facility-administered medications for this visit.     OBJECTIVE: Vitals:   07/04/18 1057  BP: 133/71  Pulse: (!) 50  Resp: 18  Temp: (!) 95.9 F (35.5 C)     Body mass index is 25.91 kg/m.    ECOG FS:0 - Asymptomatic  General: Well-developed, well-nourished, no acute distress. Eyes: Pink conjunctiva, anicteric sclera. HEENT: Normocephalic, moist mucous membranes. Lungs: Clear to  auscultation bilaterally. Heart: Regular rate and rhythm. No rubs, murmurs, or gallops. Abdomen: Soft, nontender, nondistended. No organomegaly noted, normoactive bowel sounds. Musculoskeletal: No edema, cyanosis, or clubbing. Neuro: Alert, answering all questions appropriately. Cranial nerves grossly intact. Skin: No rashes or petechiae noted. Psych: Normal affect.  LAB RESULTS:  Lab Results  Component Value Date   NA 137 06/27/2018   K 4.1 06/27/2018   CL 101 06/27/2018   CO2 29 06/27/2018   GLUCOSE 95 06/27/2018   BUN 29 (H) 06/27/2018   CREATININE 0.89 06/27/2018   CALCIUM 9.4 06/27/2018   PROT 8.3 (H) 09/30/2011   ALBUMIN 3.8 09/30/2011   AST 22 09/30/2011   ALT 31 09/30/2011   ALKPHOS 94 09/30/2011   BILITOT 0.6 09/30/2011   GFRNONAA >60 06/27/2018   GFRAA >60 06/27/2018    Lab Results  Component Value Date   WBC 4.0 06/27/2018   NEUTROABS 1.9 06/27/2018   HGB 12.4 (L) 06/27/2018   HCT 38.4 (L) 06/27/2018   MCV 101.9 (H) 06/27/2018   PLT 176 06/27/2018   Lab Results  Component Value Date   TOTALPROTELP 7.3 06/27/2018   ALBUMINELP 3.9 06/27/2018   A1GS 0.2 06/27/2018   A2GS 0.7 06/27/2018   BETS 0.9 06/27/2018   GAMS 1.5 06/27/2018   MSPIKE 0.7 (H) 06/27/2018   SPEI Comment 06/27/2018     STUDIES: No results found.  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  PLAN:    1. MGUS: Patient's M spike has been essentially unchanged at~ 0.6 - 0.7 for greater than 5 years his most recent result was 0.7.  His immunoglobulins continue to be within normal limits.  He previously had an IgG predominance.  His kappa/lambda free light chain ratio is also within normal limits.  Bone marrow biopsy on October 23, 2009 revealed a plasma cell dyscrasia with 10% abnormal plasma cells.  Patient has no evidence of endorgan damage and is asymptomatic.  No intervention is needed at this time.  Patient is at low risk of progression and it was recommended the patient be  discharged from clinic.  After further discussion, patient is more comfortable if he continues to be monitored on a yearly basis.  Return to clinic in 1 year for further evaluation.  No intervention is needed at this time, continue simple observation.  Return to clinic in 6 months with repeat laboratory work only and then in 1 year for laboratory work and further evaluation.   2.  Left renal Oncocytoma: Patient is over 10 years out from his cryoablation.  No further imaging is necessary.    I spent a total of 20 minutes face-to-face with the patient of which greater than 50% of the visit was spent in counseling and coordination of care as detailed above.  Patient expressed understanding and was in agreement with this plan. He also understands that He can call clinic at any time with any questions, concerns, or complaints.    Lloyd Huger, MD   07/05/2018 6:46 AM

## 2018-07-04 ENCOUNTER — Other Ambulatory Visit: Payer: Self-pay

## 2018-07-04 ENCOUNTER — Inpatient Hospital Stay: Payer: Medicare HMO | Admitting: Oncology

## 2018-07-04 VITALS — BP 133/71 | HR 50 | Temp 95.9°F | Resp 18 | Wt 170.4 lb

## 2018-07-04 DIAGNOSIS — E785 Hyperlipidemia, unspecified: Secondary | ICD-10-CM

## 2018-07-04 DIAGNOSIS — Z86018 Personal history of other benign neoplasm: Secondary | ICD-10-CM | POA: Diagnosis not present

## 2018-07-04 DIAGNOSIS — D472 Monoclonal gammopathy: Secondary | ICD-10-CM | POA: Diagnosis not present

## 2018-07-04 DIAGNOSIS — J449 Chronic obstructive pulmonary disease, unspecified: Secondary | ICD-10-CM | POA: Diagnosis not present

## 2018-07-04 DIAGNOSIS — H409 Unspecified glaucoma: Secondary | ICD-10-CM

## 2018-07-04 DIAGNOSIS — I10 Essential (primary) hypertension: Secondary | ICD-10-CM

## 2018-07-04 NOTE — Progress Notes (Signed)
Here for follow up. Per pt " im feeling pretty good "

## 2018-07-10 DIAGNOSIS — Z7901 Long term (current) use of anticoagulants: Secondary | ICD-10-CM | POA: Diagnosis not present

## 2018-08-18 DIAGNOSIS — R600 Localized edema: Secondary | ICD-10-CM | POA: Diagnosis not present

## 2018-08-18 DIAGNOSIS — Z7901 Long term (current) use of anticoagulants: Secondary | ICD-10-CM | POA: Diagnosis not present

## 2018-08-18 DIAGNOSIS — D3002 Benign neoplasm of left kidney: Secondary | ICD-10-CM | POA: Diagnosis not present

## 2018-08-18 DIAGNOSIS — I480 Paroxysmal atrial fibrillation: Secondary | ICD-10-CM | POA: Diagnosis not present

## 2018-08-18 DIAGNOSIS — E039 Hypothyroidism, unspecified: Secondary | ICD-10-CM | POA: Diagnosis not present

## 2018-08-18 DIAGNOSIS — M159 Polyosteoarthritis, unspecified: Secondary | ICD-10-CM | POA: Diagnosis not present

## 2018-08-18 DIAGNOSIS — J449 Chronic obstructive pulmonary disease, unspecified: Secondary | ICD-10-CM | POA: Diagnosis not present

## 2018-08-18 DIAGNOSIS — E782 Mixed hyperlipidemia: Secondary | ICD-10-CM | POA: Diagnosis not present

## 2018-08-18 DIAGNOSIS — D472 Monoclonal gammopathy: Secondary | ICD-10-CM | POA: Diagnosis not present

## 2018-08-18 DIAGNOSIS — I10 Essential (primary) hypertension: Secondary | ICD-10-CM | POA: Diagnosis not present

## 2018-08-24 DIAGNOSIS — H401131 Primary open-angle glaucoma, bilateral, mild stage: Secondary | ICD-10-CM | POA: Diagnosis not present

## 2018-09-19 DIAGNOSIS — Z7901 Long term (current) use of anticoagulants: Secondary | ICD-10-CM | POA: Diagnosis not present

## 2018-10-13 ENCOUNTER — Emergency Department: Payer: Medicare HMO

## 2018-10-13 ENCOUNTER — Other Ambulatory Visit: Payer: Self-pay

## 2018-10-13 ENCOUNTER — Inpatient Hospital Stay
Admission: EM | Admit: 2018-10-13 | Discharge: 2018-10-19 | DRG: 228 | Disposition: A | Discharge status: Skilled Nursing Facility | Payer: Medicare HMO | Source: Ambulatory Visit | Attending: Internal Medicine | Admitting: Internal Medicine

## 2018-10-13 DIAGNOSIS — R059 Cough, unspecified: Secondary | ICD-10-CM

## 2018-10-13 DIAGNOSIS — Z7982 Long term (current) use of aspirin: Secondary | ICD-10-CM

## 2018-10-13 DIAGNOSIS — E860 Dehydration: Secondary | ICD-10-CM | POA: Diagnosis not present

## 2018-10-13 DIAGNOSIS — H409 Unspecified glaucoma: Secondary | ICD-10-CM | POA: Diagnosis present

## 2018-10-13 DIAGNOSIS — Z87891 Personal history of nicotine dependence: Secondary | ICD-10-CM | POA: Diagnosis not present

## 2018-10-13 DIAGNOSIS — Z8249 Family history of ischemic heart disease and other diseases of the circulatory system: Secondary | ICD-10-CM

## 2018-10-13 DIAGNOSIS — Z7901 Long term (current) use of anticoagulants: Secondary | ICD-10-CM

## 2018-10-13 DIAGNOSIS — E785 Hyperlipidemia, unspecified: Secondary | ICD-10-CM | POA: Diagnosis not present

## 2018-10-13 DIAGNOSIS — M6281 Muscle weakness (generalized): Secondary | ICD-10-CM | POA: Diagnosis not present

## 2018-10-13 DIAGNOSIS — R05 Cough: Secondary | ICD-10-CM

## 2018-10-13 DIAGNOSIS — R2681 Unsteadiness on feet: Secondary | ICD-10-CM | POA: Diagnosis not present

## 2018-10-13 DIAGNOSIS — R001 Bradycardia, unspecified: Secondary | ICD-10-CM | POA: Diagnosis not present

## 2018-10-13 DIAGNOSIS — I482 Chronic atrial fibrillation, unspecified: Secondary | ICD-10-CM | POA: Diagnosis not present

## 2018-10-13 DIAGNOSIS — E039 Hypothyroidism, unspecified: Secondary | ICD-10-CM | POA: Diagnosis present

## 2018-10-13 DIAGNOSIS — I480 Paroxysmal atrial fibrillation: Secondary | ICD-10-CM | POA: Diagnosis not present

## 2018-10-13 DIAGNOSIS — R0602 Shortness of breath: Secondary | ICD-10-CM | POA: Diagnosis not present

## 2018-10-13 DIAGNOSIS — Z8679 Personal history of other diseases of the circulatory system: Secondary | ICD-10-CM | POA: Diagnosis not present

## 2018-10-13 DIAGNOSIS — J449 Chronic obstructive pulmonary disease, unspecified: Secondary | ICD-10-CM | POA: Diagnosis not present

## 2018-10-13 DIAGNOSIS — I248 Other forms of acute ischemic heart disease: Secondary | ICD-10-CM | POA: Diagnosis not present

## 2018-10-13 DIAGNOSIS — I10 Essential (primary) hypertension: Secondary | ICD-10-CM | POA: Diagnosis not present

## 2018-10-13 DIAGNOSIS — Z950 Presence of cardiac pacemaker: Secondary | ICD-10-CM | POA: Diagnosis not present

## 2018-10-13 DIAGNOSIS — I110 Hypertensive heart disease with heart failure: Secondary | ICD-10-CM | POA: Diagnosis present

## 2018-10-13 DIAGNOSIS — I5033 Acute on chronic diastolic (congestive) heart failure: Secondary | ICD-10-CM | POA: Diagnosis not present

## 2018-10-13 DIAGNOSIS — R0600 Dyspnea, unspecified: Secondary | ICD-10-CM | POA: Diagnosis not present

## 2018-10-13 DIAGNOSIS — I2510 Atherosclerotic heart disease of native coronary artery without angina pectoris: Secondary | ICD-10-CM | POA: Diagnosis not present

## 2018-10-13 DIAGNOSIS — Z7952 Long term (current) use of systemic steroids: Secondary | ICD-10-CM

## 2018-10-13 DIAGNOSIS — N179 Acute kidney failure, unspecified: Secondary | ICD-10-CM | POA: Diagnosis not present

## 2018-10-13 DIAGNOSIS — I495 Sick sinus syndrome: Secondary | ICD-10-CM | POA: Diagnosis not present

## 2018-10-13 DIAGNOSIS — Z9861 Coronary angioplasty status: Secondary | ICD-10-CM | POA: Diagnosis not present

## 2018-10-13 DIAGNOSIS — R7989 Other specified abnormal findings of blood chemistry: Secondary | ICD-10-CM | POA: Diagnosis not present

## 2018-10-13 HISTORY — DX: Hypothyroidism, unspecified: E03.9

## 2018-10-13 HISTORY — DX: Unspecified right bundle-branch block: I45.10

## 2018-10-13 HISTORY — DX: Chronic obstructive pulmonary disease, unspecified: J44.9

## 2018-10-13 HISTORY — DX: Monoclonal gammopathy: D47.2

## 2018-10-13 HISTORY — DX: Hyperlipidemia, unspecified: E78.5

## 2018-10-13 HISTORY — DX: Unspecified atrial fibrillation: I48.91

## 2018-10-13 HISTORY — DX: Cardiac arrhythmia, unspecified: I49.9

## 2018-10-13 HISTORY — DX: Left anterior fascicular block: I44.4

## 2018-10-13 HISTORY — DX: Gastro-esophageal reflux disease without esophagitis: K21.9

## 2018-10-13 HISTORY — DX: Anemia, unspecified: D64.9

## 2018-10-13 LAB — PROTIME-INR
INR: 2.6 — ABNORMAL HIGH (ref 0.8–1.2)
Prothrombin Time: 27.3 seconds — ABNORMAL HIGH (ref 11.4–15.2)

## 2018-10-13 LAB — CBC
HEMATOCRIT: 37.3 % — AB (ref 39.0–52.0)
Hemoglobin: 12.1 g/dL — ABNORMAL LOW (ref 13.0–17.0)
MCH: 32.6 pg (ref 26.0–34.0)
MCHC: 32.4 g/dL (ref 30.0–36.0)
MCV: 100.5 fL — ABNORMAL HIGH (ref 80.0–100.0)
Platelets: 248 10*3/uL (ref 150–400)
RBC: 3.71 MIL/uL — ABNORMAL LOW (ref 4.22–5.81)
RDW: 12.9 % (ref 11.5–15.5)
WBC: 9.2 10*3/uL (ref 4.0–10.5)
nRBC: 0 % (ref 0.0–0.2)

## 2018-10-13 LAB — BASIC METABOLIC PANEL
Anion gap: 11 (ref 5–15)
BUN: 43 mg/dL — ABNORMAL HIGH (ref 8–23)
CHLORIDE: 103 mmol/L (ref 98–111)
CO2: 26 mmol/L (ref 22–32)
Calcium: 9.1 mg/dL (ref 8.9–10.3)
Creatinine, Ser: 1.28 mg/dL — ABNORMAL HIGH (ref 0.61–1.24)
GFR calc Af Amer: 58 mL/min — ABNORMAL LOW (ref >60)
GFR calc non Af Amer: 50 mL/min — ABNORMAL LOW (ref >60)
Glucose, Bld: 127 mg/dL — ABNORMAL HIGH (ref 70–99)
Potassium: 4 mmol/L (ref 3.5–5.1)
Sodium: 140 mmol/L (ref 135–145)

## 2018-10-13 LAB — TROPONIN I: Troponin I: 0.04 ng/mL (ref <0.03)

## 2018-10-13 LAB — BRAIN NATRIURETIC PEPTIDE: B Natriuretic Peptide: 1757 pg/mL — ABNORMAL HIGH (ref 0.0–100.0)

## 2018-10-13 LAB — TSH: TSH: 8.029 u[IU]/mL — AB (ref 0.350–4.500)

## 2018-10-13 MED ORDER — SODIUM CHLORIDE 0.9 % IV SOLN
250.0000 mL | INTRAVENOUS | Status: DC | PRN
  Administered 2018-10-13: 250 mL via INTRAVENOUS

## 2018-10-13 MED ORDER — SENNOSIDES-DOCUSATE SODIUM 8.6-50 MG PO TABS: 1.0000 | ORAL_TABLET | Freq: Every evening | ORAL | Status: DC | PRN

## 2018-10-13 MED ORDER — SODIUM CHLORIDE 0.9% FLUSH: 3.0000 mL | INTRAVENOUS | Status: DC | PRN

## 2018-10-13 MED ORDER — SODIUM CHLORIDE 0.9% FLUSH: 3.0000 mL | Freq: Once | INTRAVENOUS | Status: DC

## 2018-10-13 MED ORDER — ACETAMINOPHEN 325 MG PO TABS
650.0000 mg | ORAL_TABLET | Freq: Four times a day (QID) | ORAL | Status: DC | PRN
  Administered 2018-10-14 – 2018-10-15 (×2): 650 mg via ORAL
  Filled 2018-10-13 (×3): qty 2

## 2018-10-13 MED ORDER — ASPIRIN EC 81 MG PO TBEC: 81.0000 mg | DELAYED_RELEASE_TABLET | Freq: Every day | ORAL | Status: DC

## 2018-10-13 MED ORDER — LATANOPROST 0.005 % OP SOLN
1.0000 [drp] | Freq: Every day | OPHTHALMIC | Status: DC
  Administered 2018-10-14 – 2018-10-18 (×5): 1 [drp] via OPHTHALMIC
  Filled 2018-10-13: qty 2.5

## 2018-10-13 MED ORDER — ONDANSETRON HCL 4 MG PO TABS: 4.0000 mg | ORAL_TABLET | Freq: Four times a day (QID) | ORAL | Status: DC | PRN

## 2018-10-13 MED ORDER — BISACODYL 5 MG PO TBEC: 5.0000 mg | DELAYED_RELEASE_TABLET | Freq: Every day | ORAL | Status: DC | PRN

## 2018-10-13 MED ORDER — DORZOLAMIDE HCL-TIMOLOL MAL 2-0.5 % OP SOLN
1.0000 [drp] | Freq: Two times a day (BID) | OPHTHALMIC | Status: DC
  Administered 2018-10-14 – 2018-10-19 (×10): 1 [drp] via OPHTHALMIC
  Filled 2018-10-13: qty 10

## 2018-10-13 MED ORDER — ALBUTEROL SULFATE (2.5 MG/3ML) 0.083% IN NEBU: 2.5000 mg | INHALATION_SOLUTION | RESPIRATORY_TRACT | Status: DC | PRN

## 2018-10-13 MED ORDER — POTASSIUM CHLORIDE CRYS ER 10 MEQ PO TBCR
10.0000 meq | EXTENDED_RELEASE_TABLET | Freq: Every day | ORAL | Status: DC
  Administered 2018-10-14 – 2018-10-19 (×6): 10 meq via ORAL
  Filled 2018-10-13 (×6): qty 1

## 2018-10-13 MED ORDER — ONDANSETRON HCL 4 MG/2ML IJ SOLN: 4.0000 mg | Freq: Four times a day (QID) | INTRAMUSCULAR | Status: DC | PRN

## 2018-10-13 MED ORDER — HYDROCODONE-ACETAMINOPHEN 5-325 MG PO TABS
1.0000 | ORAL_TABLET | ORAL | Status: DC | PRN
  Administered 2018-10-16: 1 via ORAL
  Filled 2018-10-13: qty 1
  Filled 2018-10-13: qty 2

## 2018-10-13 MED ORDER — MOMETASONE FURO-FORMOTEROL FUM 200-5 MCG/ACT IN AERO
2.0000 | INHALATION_SPRAY | Freq: Two times a day (BID) | RESPIRATORY_TRACT | Status: DC
  Administered 2018-10-14 – 2018-10-19 (×11): 2 via RESPIRATORY_TRACT
  Filled 2018-10-13: qty 8.8

## 2018-10-13 MED ORDER — ATROPINE SULFATE 1 MG/10ML IJ SOSY
1.0000 mg | PREFILLED_SYRINGE | INTRAMUSCULAR | Status: DC | PRN
  Filled 2018-10-13: qty 10

## 2018-10-13 MED ORDER — SODIUM CHLORIDE 0.9% FLUSH
3.0000 mL | Freq: Two times a day (BID) | INTRAVENOUS | Status: DC
  Administered 2018-10-14 – 2018-10-18 (×7): 3 mL via INTRAVENOUS

## 2018-10-13 MED ORDER — ACETAMINOPHEN 650 MG RE SUPP: 650.0000 mg | Freq: Four times a day (QID) | RECTAL | Status: DC | PRN

## 2018-10-13 NOTE — H&P (Addendum)
[facility] [place] NAME: Gabriel Delacruz    MR#:  [id number]  DATE OF BIRTH:  05-23-1928  DATE OF ADMISSION:  10/13/2018  PRIMARY CARE PHYSICIAN: Marinda Elk, MD   REQUESTING/REFERRING PHYSICIAN: Dr. Burlene Arnt.  CHIEF COMPLAINT:   Chief Complaint  Patient presents with  . Bradycardia  . Cough   Severe bradycardia. HISTORY OF PRESENT ILLNESS:  Gabriel Delacruz  is a 85 y.o. male with a known history of hypertension, A. fib,, COPD, GERD, anemia, hypertension, hyperlipidemia, hypothyroidism glaucoma and etc. the patient is sent to ED by PCP due to severe bradycardia. The patient went to PCPs office for cough and congestion.  He denies any fever or chills, no chest pain, palpitation, shortness of breath or leg swelling.  He has severe bradycardia at studies.  EKG show junctional bradycardia.  Dr. Burlene Arnt discussed with Dr. Saralyn Pilar, who suggest admiting the patient to telemetry floor. PAST MEDICAL HISTORY:   Past Medical History:  Diagnosis Date  . Anemia   . Atrial fibrillation ([place])   . COPD (chronic obstructive pulmonary disease) ([place])   . GERD (gastroesophageal reflux disease)   . Glaucoma   . Hyperlipemia   . Hypertension   . Hypothyroidism   . Irregular heart rhythm   . Left anterior fascicular block   . MGUS (monoclonal gammopathy of unknown significance)   . RBBB     PAST SURGICAL HISTORY:   Past Surgical History:  Procedure Laterality Date  . Cervical and lumbar disc surgeries    . Cryoablation left renal mass    . TONSILLECTOMY      SOCIAL HISTORY:   Social History   Tobacco Use  . Smoking status: Former Research scientist (life sciences)  . Smokeless tobacco: Never Used  Substance Use Topics  . Alcohol use: Not Currently    FAMILY HISTORY:   Family History  Problem Relation Age of Onset  . Hypertension Mother   . Hypertension Father     DRUG ALLERGIES:  No Known Allergies  REVIEW OF SYSTEMS:   Review of Systems    Constitutional: Negative for chills, fever and malaise/fatigue.  HENT: Negative for sore throat.   Eyes: Negative for blurred vision and double vision.  Respiratory: Positive for cough. Negative for hemoptysis, shortness of breath, wheezing and stridor.   Cardiovascular: Negative for chest pain, palpitations, orthopnea and leg swelling.  Gastrointestinal: Negative for abdominal pain, blood in stool, diarrhea, melena, nausea and vomiting.  Genitourinary: Negative for dysuria, flank pain and hematuria.  Musculoskeletal: Negative for back pain and joint pain.  Neurological: Negative for dizziness, sensory change, focal weakness, seizures, loss of consciousness, weakness and headaches.  Endo/Heme/Allergies: Negative for polydipsia.  Psychiatric/Behavioral: Negative for depression. The patient is not nervous/anxious.     MEDICATIONS AT HOME:   Prior to Admission medications   Medication Sig Start Date End Date Taking? Authorizing Provider  acetaminophen (TYLENOL) 325 MG tablet Take 650 mg by mouth every 6 (six) hours as needed.     [provider]  albuterol (PROAIR HFA) 108 (90 BASE) MCG/ACT inhaler Inhale 2 puffs into the lungs every 6 (six) hours as needed.     [provider]  amLODipine (NORVASC) 5 MG tablet Take 5 mg by mouth daily.     [provider]  aspirin EC 81 MG tablet Take 81 mg by mouth daily.     [provider]  dorzolamide-timolol (COSOPT) 22.3-6.8 MG/ML ophthalmic solution 1 drop Two (2) times a  day as needed    [provider]  Fluticasone-Salmeterol (ADVAIR DISKUS) 250-50 MCG/DOSE AEPB Inhale 1 puff into the lungs 2 (two) times daily.     [provider]  furosemide (LASIX) 20 MG tablet Take 20 mg by mouth daily.     [provider]  losartan-hydrochlorothiazide (HYZAAR) 100-12.5 MG tablet Take 1 tablet by mouth daily.     [provider]  multivitamin-iron-minerals-folic acid (CENTRUM) chewable tablet  Chew 1 tablet by mouth daily.     [provider]  nebivolol (BYSTOLIC) 10 MG tablet Take 10 mg by mouth daily.     [provider]  potassium chloride (K-DUR) 10 MEQ tablet Take 10 mEq by mouth daily.     [provider]  predniSONE (DELTASONE) 20 MG tablet Take 2 tablets (40 mg total) by mouth daily. 10/30/16   Nance Pear, MD  Travoprost, BAK Free, (TRAVATAN Z) 0.004 % SOLN ophthalmic solution PLACE 1 DROP INTO BOTH EYES ONCE DAILY 01/25/18   [provider]  warfarin (COUMADIN) 6 MG tablet TAKE 1 TABLET EVERY DAY 01/25/18   [provider]      VITAL SIGNS:  Blood pressure (!) 133/92, pulse (!) 38, temperature 97.6 F (36.4 C), temperature source Oral, resp. rate 15, height 5\' 10"  (1.778 m), weight 83.9 kg, SpO2 99 %.  PHYSICAL EXAMINATION:  Physical Exam  GENERAL:  85 y.o.-year-old patient lying in the bed with no acute distress.  EYES: Pupils equal, round, reactive to light and accommodation. No scleral icterus. Extraocular muscles intact.  HEENT: Head atraumatic, normocephalic. Oropharynx and nasopharynx clear.  NECK:  Supple, no jugular venous distention. No thyroid enlargement, no tenderness.  LUNGS: Normal breath sounds bilaterally, no wheezing, rales,rhonchi or crepitation. No use of accessory muscles of respiration.  CARDIOVASCULAR: S1, S2 severe bradycardia. No murmurs, rubs, or gallops.  ABDOMEN: Soft, nontender, nondistended. Bowel sounds present. No organomegaly or mass.  EXTREMITIES: No pedal edema, cyanosis, or clubbing.  NEUROLOGIC: Cranial nerves II through XII are intact. Muscle strength 5/5 in all extremities. Sensation intact. Gait not checked.  PSYCHIATRIC: The patient is alert and oriented x 3.  SKIN: No obvious rash, lesion, or ulcer.   LABORATORY PANEL:   CBC Recent Labs  Lab 10/13/18 1251  WBC 9.2  HGB 12.1*  HCT 37.3*  PLT 248    ------------------------------------------------------------------------------------------------------------------  Chemistries  Recent Labs  Lab 10/13/18 1251  NA 140  K 4.0  CL 103  CO2 26  GLUCOSE 127*  BUN 43*  CREATININE 1.28*  CALCIUM 9.1   ------------------------------------------------------------------------------------------------------------------  Cardiac Enzymes Recent Labs  Lab 10/13/18 1251  TROPONINI 0.04*   ------------------------------------------------------------------------------------------------------------------  RADIOLOGY:  Dg Chest 2 View  Result Date: 10/13/2018 CLINICAL DATA:  Bradycardia. Shortness of breath. EXAM: CHEST - 2 VIEW COMPARISON:  10/30/2016 FINDINGS: The cardiac silhouette is mildly to moderately enlarged. Aortic atherosclerosis is noted. There is mild linear opacity in the left lung base and left midlung. There is blunting of the costophrenic angles posteriorly which could reflect trace pleural effusions. No pneumothorax or edema is identified. No acute osseous abnormality is seen. IMPRESSION: 1. Cardiomegaly with left mid and lower lung subsegmental atelectasis. 2. Possible trace bilateral pleural effusions. Electronically Signed   By: [name] Bores M.D.   On: 10/13/2018 14:17      IMPRESSION AND PLAN:   Severe bradycardia. The patient will be admitted to telemetry floor. Continue telemetry monitor, hold bystolic, follow echocardiograph and Dr. Saralyn Pilar.  Acute renal failure due to  dehydration.  Hold Lasix and losartan-HCTZ follow-up BMP.  History of A. fib.  Hold  beta-blocker.  Hold Coumadin for possible procedure.  Elevated troponin.  Follow-up troponin level, continue Coumadin pharmacy to dose. COPD.  Stable, continue home nebulizer.  All the records are reviewed and case discussed with ED provider. Management plans discussed with the patient, his son and they are in agreement.  CODE STATUS:   TOTAL TIME TAKING  CARE OF THIS PATIENT: 35 minutes.    Demetrios Loll M.D on 10/13/2018 at 2:34 PM  Between 7am to 6pm - Pager - [phone]  After 6pm go to [email or web address] - Proofreader  [facility]  Office  [phone]  CC: Primary care physician; Marinda Elk, MD   Note: This dictation was prepared with Dragon dictation along with smaller phrase technology. Any transcriptional errors that result from this process are unin

## 2018-10-13 NOTE — ED Provider Notes (Signed)
An echo at that time, right bundle branch block and LAFB.[facility] Emergency Department Provider Note  ____________________________________________   I have reviewed the triage vital signs and the nursing notes. Where available I have reviewed prior notes and, if possible and indicated, outside hospital notes.    HISTORY  Chief Complaint Bradycardia and Cough    HPI Gabriel Delacruz is a 85 y.o. male  Presents here today complaining of "I have cough and congestion".  No chest pain or shortness of breath.  He actually went to his primary care doctors with this complaint, he has had no recent changes medication, he has no chest pain he has no leg swelling he does not feel otherwise ill he is just been "coughing".  No fever.  Cough is nonproductive.  He states that his doctor found that he had a low heart rate and he came in here.  He states he has had low heart rate and a review of prior records states that he does in the 40s in 2018 with a slow A. fib, also has a history of right bundle branch block and LAFB.  No other symptoms or signs or complaints. He is on Coumadin for atrial for when he does take Bystolic.  He does not believe he is taking the wrong dose.  Past Medical History:  Diagnosis Date  . Glaucoma   . Hypertension   . Irregular heart rhythm     Patient Active Problem List   Diagnosis Date Noted  . MGUS (monoclonal gammopathy of unknown significance) 12/11/2015    No past surgical history on file.  Prior to Admission medications   Medication Sig Start Date End Date Taking? Authorizing Provider  acetaminophen (TYLENOL) 325 MG tablet Take 650 mg by mouth every 6 (six) hours as needed.     [provider]  albuterol (PROAIR HFA) 108 (90 BASE) MCG/ACT inhaler Inhale 2 puffs into the lungs every 6 (six) hours as needed.     [provider]  amLODipine (NORVASC) 5 MG tablet Take 5 mg by mouth daily.     [provider]   aspirin EC 81 MG tablet Take 81 mg by mouth daily.     [provider]  dorzolamide-timolol (COSOPT) 22.3-6.8 MG/ML ophthalmic solution 1 drop Two (2) times a day as needed    [provider]  Fluticasone-Salmeterol (ADVAIR DISKUS) 250-50 MCG/DOSE AEPB Inhale 1 puff into the lungs 2 (two) times daily.     [provider]  furosemide (LASIX) 20 MG tablet Take 20 mg by mouth daily.     [provider]  losartan-hydrochlorothiazide (HYZAAR) 100-12.5 MG tablet Take 1 tablet by mouth daily.     [provider]  multivitamin-iron-minerals-folic acid (CENTRUM) chewable tablet Chew 1 tablet by mouth daily.     [provider]  nebivolol (BYSTOLIC) 10 MG tablet Take 10 mg by mouth daily.     [provider]  potassium chloride (K-DUR) 10 MEQ tablet Take 10 mEq by mouth daily.     [provider]  predniSONE (DELTASONE) 20 MG tablet Take 2 tablets (40 mg total) by mouth daily. 10/30/16   Nance Pear, MD  Travoprost, BAK Free, (TRAVATAN Z) 0.004 % SOLN ophthalmic solution PLACE 1 DROP INTO BOTH EYES ONCE DAILY 01/25/18   [provider]  warfarin (COUMADIN) 6 MG tablet TAKE 1 TABLET EVERY DAY 01/25/18   [provider]    Allergies Patient has no known allergies.  No  family history on file.  Social History Social History   Tobacco Use  . Smoking status: Former Research scientist (life sciences)  . Smokeless tobacco: Never Used  Substance Use Topics  . Alcohol use: Not Currently  . Drug use: Not Currently    Review of Systems Constitutional: No fever/chills Eyes: No visual changes. ENT: No sore throat. No stiff neck no neck pain Cardiovascular: Denies chest pain. Respiratory: Denies shortness of breath. Gastrointestinal:   no vomiting.  No diarrhea.  No constipation. Genitourinary: Negative for dysuria. Musculoskeletal: Negative lower extremity swelling Skin: Negative for rash. Neurological: Negative for severe headaches,  focal weakness or numbness.   ____________________________________________   PHYSICAL EXAM:  VITAL SIGNS: ED Triage Vitals  Enc Vitals Group     BP 10/13/18 1226 (!) 143/68     Pulse Rate 10/13/18 1245 (!) 31     Resp 10/13/18 1226 18     Temp 10/13/18 1226 97.6 F (36.4 C)     Temp Source 10/13/18 1226 Oral     SpO2 10/13/18 1245 99 %     Weight 10/13/18 1249 185 lb (83.9 kg)     Height 10/13/18 1249 5\' 10"  (1.778 m)     Head Circumference --      Peak Flow --      Pain Score 10/13/18 1248 0     Pain Loc --      Pain Edu? --      Excl. in [place]? --     Constitutional: Alert and oriented. Well appearing and in no acute distress. Eyes: Conjunctivae are normal Head: Atraumatic HEENT: No congestion/rhinnorhea. Mucous membranes are moist.  Oropharynx non-erythematous Neck:   Nontender with no meningismus, no masses, no stridor Cardiovascular: Bradycardia, regular rhythm. Grossly normal heart sounds.  Good peripheral circulation. Respiratory: Normal respiratory effort.  No retractions.  Rhonchorous Abdominal: Soft and nontender. No distention. No guarding no rebound Back:  There is no focal tenderness or step off.  there is no midline tenderness there are no lesions noted. there is no CVA tenderness Musculoskeletal: No lower extremity tenderness, no upper extremity tenderness. No joint effusions, no DVT signs strong distal pulses no edema Neurologic:  Normal speech and language. No gross focal neurologic deficits are appreciated.  Skin:  Skin is warm, dry and intact. No rash noted. Psychiatric: Mood and affect are normal. Speech and behavior are normal.  ____________________________________________   LABS (all labs ordered are listed, but only abnormal results are displayed)  Labs Reviewed  BASIC METABOLIC PANEL - Abnormal; Notable for the following components:      Result Value   Glucose, Bld 127 (*)    BUN 43 (*)    Creatinine, Ser 1.28 (*)    GFR calc non Af Amer 50  (*)    GFR calc Af Amer 58 (*)    All other components within normal limits  CBC - Abnormal; Notable for the following components:   RBC 3.71 (*)    Hemoglobin 12.1 (*)    HCT 37.3 (*)    MCV 100.5 (*)    All other components within normal limits  TROPONIN I - Abnormal; Notable for the following components:   Troponin I 0.04 (*)    All other components within normal limits  PROTIME-INR - Abnormal; Notable for the following components:   Prothrombin Time 27.3 (*)    INR 2.6 (*)    All other components within normal limits  BRAIN NATRIURETIC PEPTIDE  TSH    Pertinent labs  results  that were available during my care of the patient were reviewed by me and considered in my medical decision making (see chart for details). ____________________________________________  EKG  I personally interpreted any EKGs ordered by me or triage Likely junctional rhythm wide complex rate in the 30s, bundle branch block appreciated, ____________________________________________  RADIOLOGY  Pertinent labs & imaging results that were available during my care of the patient were reviewed by me and considered in my medical decision making (see chart for details). If possible, patient and/or family made aware of any abnormal findings.  No results found. ____________________________________________    PROCEDURES  Procedure(s) performed: None  Procedures  Critical Care performed: CRITICAL CARE Performed by: Schuyler Amor   Total critical care time: 45  minutes  Critical care time was exclusive of separately billable procedures and treating other patients.  Critical care was necessary to treat or prevent imminent or life-threatening deterioration.  Critical care was time spent personally by me on the following activities: development of treatment plan with patient and/or surrogate as well as nursing, discussions with consultants, evaluation of patient's response to treatment, examination of  patient, obtaining history from patient or surrogate, ordering and performing treatments and interventions, ordering and review of laboratory studies, ordering and review of radiographic studies, pulse oximetry and re-evaluation of patient's condition.   ____________________________________________   INITIAL IMPRESSION / ASSESSMENT AND PLAN / ED COURSE  Pertinent labs & imaging results that were available during my care of the patient were reviewed by me and considered in my medical decision making (see chart for details).  Patient with bradycardia, does have a history of significant bradycardia in the past, prior Holter monitors show rates in the 42A, he is on Bystolic.  He is here for a cough.  Chest x-ray does not show pneumonia.  Likely he will need some Lasix.  Blood pressure has been holding well.  I did talk to Dr. Lennie Hummer of cardiology, very much appreciate consult, he believes this is likely a Bystolic thing.  Given the patient's history of this as well as known intraventricular conduction delay, he does not feel the patient needs to be placed in the ICU for this, however, they will watch him closely.  I did also talk to the hospitalist service, they agree with management will accept the patient onto their service.  There is no indication for emergent pacing at this time and patient is satting well with no acute distress.  NP is noted, they will likely initiate Lasix.    ____________________________________________   FINAL CLINICAL IMPRESSION(S) / ED DIAGNOSES  Final diagnoses:  None      This chart was dictated using voice recognition software.  Despite best efforts to proofread,  errors can occur which can change meaning.      Schuyler Amor, MD 10/13/18 [phone]

## 2018-10-13 NOTE — ED Triage Notes (Signed)
Pt was sent from PCP due to bradycardia HR in the 30's. Pt states he went there today for a cough/cold sx, denies any chest pain.

## 2018-10-13 NOTE — Consult Note (Signed)
[facility] Cardiology  CARDIOLOGY CONSULT NOTE  Patient ID: Gabriel Delacruz MRN: [id number] DOB/AGE: July 16, 1928 85 y.o.  Admit date: 10/13/2018 Referring Physician Bridgett Larsson Primary Physician [facility] Primary Cardiologist Fath Reason for Consultation bradycardia  HPI: 85 year old gentleman referred for evaluation of bradycardia.  Patient has a history of atrial fibrillation with slow ventricular rate.  The patient currently is on warfarin for stroke prevention.  She went to see his primary care provider and history of productive cough and congestion.  Was noted to be bradycardic and was sent to [facility] emergency room where ECG revealed junctional bradycardia at a rate of 31 bpm.  Patient denies chest pain, presyncope or syncope.  Patient is hemodynamically stable.  The echocardiogram 6/25 2018 revealed normal left ventricular function, with LVEF 50% with moderate mitral and tricuspid regurgitation.  48-hour Holter monitor 01/20/2017 revealed atrial fibrillation with a rate of 63 bpm with range of 34 to 92 bpm.  Patient currently is on Bystolic for essential hypertension.  Review of systems complete and found to be negative unless listed above     Past Medical History:  Diagnosis Date  . Anemia   . Atrial fibrillation ([place])   . COPD (chronic obstructive pulmonary disease) ([place])   . GERD (gastroesophageal reflux disease)   . Glaucoma   . Hyperlipemia   . Hypertension   . Hypothyroidism   . Irregular heart rhythm   . Left anterior fascicular block   . MGUS (monoclonal gammopathy of unknown significance)   . RBBB     Past Surgical History:  Procedure Laterality Date  . Cervical and lumbar disc surgeries    . Cryoablation left renal mass    . TONSILLECTOMY      (Not in a hospital admission)  Social History   Socioeconomic History  . Marital status: Married    Spouse name: Not on file  . Number of children: Not on file  . Years of education: Not on file  . Highest education level: Not on  file  Occupational History  . Not on file  Social Needs  . Financial resource strain: Not on file  . Food insecurity:    Worry: Not on file    Inability: Not on file  . Transportation needs:    Medical: Not on file    Non-medical: Not on file  Tobacco Use  . Smoking status: Former Research scientist (life sciences)  . Smokeless tobacco: Never Used  Substance and Sexual Activity  . Alcohol use: Not Currently  . Drug use: Not Currently  . Sexual activity: Not on file  Lifestyle  . Physical activity:    Days per week: Not on file    Minutes per session: Not on file  . Stress: Not on file  Relationships  . Social connections:    Talks on phone: Not on file    Gets together: Not on file    Attends religious service: Not on file    Active member of club or organization: Not on file    Attends meetings of clubs or organizations: Not on file    Relationship status: Not on file  . Intimate partner violence:    Fear of current or ex partner: Not on file    Emotionally abused: Not on file    Physically abused: Not on file    Forced sexual activity: Not on file  Other Topics Concern  . Not on file  Social History Narrative  . Not on file    Family History  Problem Relation  Age of Onset  . Hypertension Mother   . Hypertension Father       Review of systems complete and found to be negative unless listed above      PHYSICAL EXAM  General: Well developed, well nourished, in no acute distress HEENT:  Normocephalic and atramatic Neck:  No JVD.  Lungs: Clear bilaterally to auscultation and percussion. Heart: HRRR . Normal S1 and S2 without gallops or murmurs.  Abdomen: Bowel sounds are positive, abdomen soft and non-tender  Msk:  Back normal, normal gait. Normal strength and tone for age. Extremities: No clubbing, cyanosis or edema.   Neuro: Alert and oriented X 3. Psych:  Good affect, responds appropriately  Labs:   Lab Results  Component Value Date   WBC 9.2 10/13/2018   HGB 12.1 (L)  10/13/2018   HCT 37.3 (L) 10/13/2018   MCV 100.5 (H) 10/13/2018   PLT 248 10/13/2018    Recent Labs  Lab 10/13/18 1251  NA 140  K 4.0  CL 103  CO2 26  BUN 43*  CREATININE 1.28*  CALCIUM 9.1  GLUCOSE 127*   Lab Results  Component Value Date   TROPONINI 0.04 ([place]) 10/13/2018   No results found for: CHOL No results found for: HDL No results found for: LDLCALC No results found for: TRIG No results found for: CHOLHDL No results found for: LDLDIRECT    Radiology: Dg Chest 2 View  Result Date: 10/13/2018 CLINICAL DATA:  Bradycardia. Shortness of breath. EXAM: CHEST - 2 VIEW COMPARISON:  10/30/2016 FINDINGS: The cardiac silhouette is mildly to moderately enlarged. Aortic atherosclerosis is noted. There is mild linear opacity in the left lung base and left midlung. There is blunting of the costophrenic angles posteriorly which could reflect trace pleural effusions. No pneumothorax or edema is identified. No acute osseous abnormality is seen. IMPRESSION: 1. Cardiomegaly with left mid and lower lung subsegmental atelectasis. 2. Possible trace bilateral pleural effusions. Electronically Signed   By: [name] Bores M.D.   On: 10/13/2018 14:17    EKG: Junctional bradycardia at 31 bpm  ASSESSMENT AND PLAN:   1.  Junctional bradycardia at 31 bpm, minimally symptomatic, hemodynamically stable 2.  History of atrial fibrillation with slow ventricular rate, on warfarin for stroke prevention 3.  Probable bronchitis with underlying history of COPD  Recommendations  1.  Hold warfarin for now 2.  Hold Bystolic 3.  If patient remains markedly bradycardic, consider permanent pacemaker implantation during this hospitalization  Signed: Isaias Cowman MD,PhD, [facility] 10/13/2018, 3:42 PM

## 2018-10-13 NOTE — Progress Notes (Signed)
Advanced Care Plan.  Purpose of Encounter: CODE STATUS. Parties in Attendance: The patient, his son and me. Patient's Decisional Capacity: Yes. Medical Story: Gabriel Delacruz  is a 85 y.o. male with a known history of hypertension, A. fib,, COPD, GERD, anemia, hypertension, hyperlipidemia, hypothyroidism glaucoma and etc. The patient is being admitted for severe bradycardia, acute renal failure due to dehydration and elevated troponin.  I discussed with the patient about his current condition, prognosis and CODE STATUS.  The patient hesitated to decide full code or DNR.  He and his son agree to put full code for now, if he has cardiopulmonary arrest, to resuscitation and intubation. Plan:  Code Status: Full code. Time spent discussing advance care planning:18 minutes.

## 2018-10-14 ENCOUNTER — Inpatient Hospital Stay
Admit: 2018-10-14 | Discharge: 2018-10-14 | Disposition: A | Discharge status: Home / Self Care | Payer: Medicare HMO | Attending: Internal Medicine | Admitting: Internal Medicine

## 2018-10-14 LAB — BASIC METABOLIC PANEL
Anion gap: 13 (ref 5–15)
BUN: 45 mg/dL — ABNORMAL HIGH (ref 8–23)
CO2: 23 mmol/L (ref 22–32)
Calcium: 8.3 mg/dL — ABNORMAL LOW (ref 8.9–10.3)
Chloride: 104 mmol/L (ref 98–111)
Creatinine, Ser: 1.07 mg/dL (ref 0.61–1.24)
GFR calc Af Amer: >60 mL/min (ref >60)
GFR calc non Af Amer: >60 mL/min (ref >60)
Glucose, Bld: 93 mg/dL (ref 70–99)
Potassium: 3.5 mmol/L (ref 3.5–5.1)
Sodium: 140 mmol/L (ref 135–145)

## 2018-10-14 LAB — ECHOCARDIOGRAM COMPLETE
Height: 70 in
WEIGHTICAEL: 2960 [oz_av]

## 2018-10-14 LAB — CBC
HCT: 34 % — ABNORMAL LOW (ref 39.0–52.0)
Hemoglobin: 11.1 g/dL — ABNORMAL LOW (ref 13.0–17.0)
MCH: 32.9 pg (ref 26.0–34.0)
MCHC: 32.6 g/dL (ref 30.0–36.0)
MCV: 100.9 fL — ABNORMAL HIGH (ref 80.0–100.0)
Platelets: 201 10*3/uL (ref 150–400)
RBC: 3.37 MIL/uL — AB (ref 4.22–5.81)
RDW: 13.2 % (ref 11.5–15.5)
WBC: 7.5 10*3/uL (ref 4.0–10.5)
nRBC: 0.7 % — ABNORMAL HIGH (ref 0.0–0.2)

## 2018-10-14 NOTE — Progress Notes (Signed)
[facility] Cardiology  SUBJECTIVE: Patient laying in bed, denies chest pain or shortness of breath   Vitals:   10/13/18 2100 10/13/18 2200 10/14/18 0517 10/14/18 0754  BP: (!) 132/53 (!) 125/50 (!) 118/103 (!) 132/44  Pulse: (!) 31 62 (!) 28 (!) 28  Resp: 16 (!) 22 18   Temp:  (!) 97.5 F (36.4 C) 98.4 F (36.9 C) 98.7 F (37.1 C)  TempSrc:  Oral Oral   SpO2: 96% 96% 100% 100%  Weight:      Height:         Intake/Output Summary (Last 24 hours) at 10/14/2018 0932 Last data filed at 10/14/2018 0102 Gross per 24 hour  Intake 120 ml  Output 100 ml  Net 20 ml      PHYSICAL EXAM  General: Well developed, well nourished, in no acute distress HEENT:  Normocephalic and atramatic Neck:  No JVD.  Lungs: Clear bilaterally to auscultation and percussion. Heart: HRRR . Normal S1 and S2 without gallops or murmurs.  Abdomen: Bowel sounds are positive, abdomen soft and non-tender  Msk:  Back normal, normal gait. Normal strength and tone for age. Extremities: No clubbing, cyanosis or edema.   Neuro: Alert and oriented X 3. Psych:  Good affect, responds appropriately   LABS: Basic Metabolic Panel: Recent Labs    10/13/18 1251 10/14/18 0550  NA 140 140  K 4.0 3.5  CL 103 104  CO2 26 23  GLUCOSE 127* 93  BUN 43* 45*  CREATININE 1.28* 1.07  CALCIUM 9.1 8.3*   Liver Function Tests: No results for input(s): AST, ALT, ALKPHOS, BILITOT, PROT, ALBUMIN in the last 72 hours. No results for input(s): LIPASE, AMYLASE in the last 72 hours. CBC: Recent Labs    10/13/18 1251 10/14/18 0550  WBC 9.2 7.5  HGB 12.1* 11.1*  HCT 37.3* 34.0*  MCV 100.5* 100.9*  PLT 248 201   Cardiac Enzymes: Recent Labs    10/13/18 1251  TROPONINI 0.04*   BNP: Invalid input(s): POCBNP D-Dimer: No results for input(s): DDIMER in the last 72 hours. Hemoglobin A1C: No results for input(s): HGBA1C in the last 72 hours. Fasting Lipid Panel: No results for input(s): CHOL, HDL, LDLCALC, TRIG, CHOLHDL,  LDLDIRECT in the last 72 hours. Thyroid Function Tests: Recent Labs    10/13/18 1251  TSH 8.029*   Anemia Panel: No results for input(s): VITAMINB12, FOLATE, FERRITIN, TIBC, IRON, RETICCTPCT in the last 72 hours.  Dg Chest 2 View  Result Date: 10/13/2018 CLINICAL DATA:  Bradycardia. Shortness of breath. EXAM: CHEST - 2 VIEW COMPARISON:  10/30/2016 FINDINGS: The cardiac silhouette is mildly to moderately enlarged. Aortic atherosclerosis is noted. There is mild linear opacity in the left lung base and left midlung. There is blunting of the costophrenic angles posteriorly which could reflect trace pleural effusions. No pneumothorax or edema is identified. No acute osseous abnormality is seen. IMPRESSION: 1. Cardiomegaly with left mid and lower lung subsegmental atelectasis. 2. Possible trace bilateral pleural effusions. Electronically Signed   By: [name] Bores M.D.   On: 10/13/2018 14:17     Echo pending  TELEMETRY: Atrial fibrillation with slow ventricular rate at 29 bpm:  ASSESSMENT AND PLAN:  Active Problems:   Severe sinus bradycardia    1.  Atrial fibrillation with slow ventricular rate  Recommendations  1.  Proceed with Micra leadless pacemaker on 10/05/2018.  The risks, benefits alternatives of leadless pacemaker implantation were explained to the patient and informed written consent was obtained. 2.  Continue  to hold warfarin   Isaias Cowman, MD, PhD, [facility] 10/14/2018 9:32 AM

## 2018-10-14 NOTE — Plan of Care (Signed)
  Problem: Elimination: Goal: Will not experience complications related to bowel motility Outcome: Completed/Met

## 2018-10-14 NOTE — Progress Notes (Signed)
Family Meeting Note  Advance Directive:yes  Today a meeting took place with the Patient.  Patient is able to participate   The following clinical team members were present during this meeting:MD  The following were discussed:Patient's diagnosis: Severe persistent bradycardia, Patient's progosis: Unable to determine and Goals for treatment: Full Code  Additional follow-up to be provided: prn  Time spent during discussion:20 minutes  Gorden Harms, MD

## 2018-10-14 NOTE — Progress Notes (Signed)
[place] [facility] [place] NAME: Gabriel Delacruz    MR#:  [id number]  DATE OF BIRTH:  [birth year]  SUBJECTIVE:  CHIEF COMPLAINT:   No complaints, cardiology input appreciated, for pacer on Monday  REVIEW OF SYSTEMS:  CONSTITUTIONAL: No fever, fatigue or weakness.  EYES: No blurred or double vision.  EARS, NOSE, AND THROAT: No tinnitus or ear pain.  RESPIRATORY: No cough, shortness of breath, wheezing or hemoptysis.  CARDIOVASCULAR: No chest pain, orthopnea, edema.  GASTROINTESTINAL: No nausea, vomiting, diarrhea or abdominal pain.  GENITOURINARY: No dysuria, hematuria.  ENDOCRINE: No polyuria, nocturia,  HEMATOLOGY: No anemia, easy bruising or bleeding SKIN: No rash or lesion. MUSCULOSKELETAL: No joint pain or arthritis.   NEUROLOGIC: No tingling, numbness, weakness.  PSYCHIATRY: No anxiety or depression.   ROS  DRUG ALLERGIES:  No Known Allergies  VITALS:  Blood pressure (!) 132/44, pulse (!) 28, temperature 98.7 F (37.1 C), resp. rate 18, height 5\' 10"  (1.778 m), weight 83.9 kg, SpO2 100 %.  PHYSICAL EXAMINATION:  GENERAL:  85 y.o.-year-old patient lying in the bed with no acute distress.  EYES: Pupils equal, round, reactive to light and accommodation. No scleral icterus. Extraocular muscles intact.  HEENT: Head atraumatic, normocephalic. Oropharynx and nasopharynx clear.  NECK:  Supple, no jugular venous distention. No thyroid enlargement, no tenderness.  LUNGS: Normal breath sounds bilaterally, no wheezing, rales,rhonchi or crepitation. No use of accessory muscles of respiration.  CARDIOVASCULAR: S1, S2 normal. No murmurs, rubs, or gallops.  ABDOMEN: Soft, nontender, nondistended. Bowel sounds present. No organomegaly or mass.  EXTREMITIES: No pedal edema, cyanosis, or clubbing.  NEUROLOGIC: Cranial nerves II through XII are intact. Muscle strength 5/5 in all extremities. Sensation intact. Gait not checked.  PSYCHIATRIC: The patient is  alert and oriented x 3.  SKIN: No obvious rash, lesion, or ulcer.   Physical Exam LABORATORY PANEL:   CBC Recent Labs  Lab 10/14/18 0550  WBC 7.5  HGB 11.1*  HCT 34.0*  PLT 201   ------------------------------------------------------------------------------------------------------------------  Chemistries  Recent Labs  Lab 10/14/18 0550  NA 140  K 3.5  CL 104  CO2 23  GLUCOSE 93  BUN 45*  CREATININE 1.07  CALCIUM 8.3*   ------------------------------------------------------------------------------------------------------------------  Cardiac Enzymes Recent Labs  Lab 10/13/18 1251  TROPONINI 0.04*   ------------------------------------------------------------------------------------------------------------------  RADIOLOGY:  Dg Chest 2 View  Result Date: 10/13/2018 CLINICAL DATA:  Bradycardia. Shortness of breath. EXAM: CHEST - 2 VIEW COMPARISON:  10/30/2016 FINDINGS: The cardiac silhouette is mildly to moderately enlarged. Aortic atherosclerosis is noted. There is mild linear opacity in the left lung base and left midlung. There is blunting of the costophrenic angles posteriorly which could reflect trace pleural effusions. No pneumothorax or edema is identified. No acute osseous abnormality is seen. IMPRESSION: 1. Cardiomegaly with left mid and lower lung subsegmental atelectasis. 2. Possible trace bilateral pleural effusions. Electronically Signed   By: [name] Bores M.D.   On: 10/13/2018 14:17    ASSESSMENT AND PLAN:  *Severe persistent bradycardia Stable Cardiology input appreciated, follow-up echocardiogram, cardiology/Dr. Saralyn Pilar planning for tentative pacemaker placement on Monday, continue to hold Coumadin  *Acute kidney injury  Resolved with IV fluids for rehydration due to dehydration Continue to hold Lasix, losartan, HCTZ   *History of A. Fib Continue to hold beta-blocker therapy given severe bradycardia Coumadin on hold for possible pacer  placement on Monday  *Acute elevated troponins Stable  Cardiology input appreciated  Suspect secondary to bradycardia/demand ischemia   *COPD  without exacerbation  Breathing treatments PRN   Disposition pending clinical course 2-3 days with cardiology clearance    All the records are reviewed and case discussed with Care Management/Social Workerr. Management plans discussed with the patient, family and they are in agreement.  CODE STATUS: full  TOTAL TIME TAKING CARE OF THIS PATIENT: 35 minutes.   POSSIBLE D/C IN 2-3 DAYS, DEPENDING ON CLINICAL CONDITION.   Avel Peace Salary M.D on 10/14/2018   Between 7am to 6pm - Pager - [phone]  After 6pm go to [email or web address] - password [email or web address] [place] [facility]  Office  [phone]  CC: Primary care physician; Marinda Elk, MD  Note: This dictation was prepared with Dragon dictation along with smaller phrase technology. Any transcriptional errors that result from this process are unintentional.

## 2018-10-15 ENCOUNTER — Inpatient Hospital Stay: Payer: Medicare HMO

## 2018-10-15 LAB — PROTIME-INR
INR: 2.4 — ABNORMAL HIGH (ref 0.8–1.2)
Prothrombin Time: 25.6 seconds — ABNORMAL HIGH (ref 11.4–15.2)

## 2018-10-15 LAB — SURGICAL PCR SCREEN
MRSA, PCR: NEGATIVE
Staphylococcus aureus: NEGATIVE

## 2018-10-15 LAB — BRAIN NATRIURETIC PEPTIDE: B Natriuretic Peptide: 1429 pg/mL — ABNORMAL HIGH (ref 0.0–100.0)

## 2018-10-15 MED ORDER — FUROSEMIDE 20 MG PO TABS
20.0000 mg | ORAL_TABLET | Freq: Every day | ORAL | Status: DC
  Administered 2018-10-17: 20 mg via ORAL
  Filled 2018-10-15: qty 1

## 2018-10-15 MED ORDER — CEFAZOLIN SODIUM-DEXTROSE 2-4 GM/100ML-% IV SOLN
2.0000 g | INTRAVENOUS | Status: AC
  Filled 2018-10-15: qty 100

## 2018-10-15 MED ORDER — LOSARTAN POTASSIUM 50 MG PO TABS
50.0000 mg | ORAL_TABLET | Freq: Every day | ORAL | Status: DC
  Administered 2018-10-15 – 2018-10-19 (×4): 50 mg via ORAL
  Filled 2018-10-15 (×4): qty 1

## 2018-10-15 MED ORDER — SODIUM CHLORIDE 0.9 % IV SOLN
INTRAVENOUS | Status: DC
  Administered 2018-10-15 – 2018-10-16 (×2): via INTRAVENOUS

## 2018-10-15 MED ORDER — PHYTONADIONE 5 MG PO TABS
5.0000 mg | ORAL_TABLET | Freq: Every day | ORAL | Status: DC
  Administered 2018-10-15 – 2018-10-16 (×2): 5 mg via ORAL
  Filled 2018-10-15 (×2): qty 1

## 2018-10-15 MED ORDER — IPRATROPIUM-ALBUTEROL 0.5-2.5 (3) MG/3ML IN SOLN
3.0000 mL | Freq: Four times a day (QID) | RESPIRATORY_TRACT | Status: DC | PRN
  Administered 2018-10-16: 3 mL via RESPIRATORY_TRACT
  Filled 2018-10-15: qty 3

## 2018-10-15 MED ORDER — FUROSEMIDE 10 MG/ML IJ SOLN
40.0000 mg | Freq: Once | INTRAMUSCULAR | Status: AC
  Administered 2018-10-15: 40 mg via INTRAVENOUS
  Filled 2018-10-15: qty 4

## 2018-10-15 MED ORDER — SODIUM CHLORIDE 0.45 % IV SOLN: INTRAVENOUS | Status: DC

## 2018-10-15 NOTE — Progress Notes (Signed)
[facility] Cardiology  SUBJECTIVE: Patient laying in bed, denies chest pain or shortness of breath   Vitals:   10/15/18 0526 10/15/18 0533 10/15/18 0656 10/15/18 0903  BP:   130/70 140/72  Pulse:  (!) 28  (!) 30  Resp:  18    Temp:  98 F (36.7 C)  97.7 F (36.5 C)  TempSrc:    Oral  SpO2:  100%  100%  Weight: 78.8 kg     Height:         Intake/Output Summary (Last 24 hours) at 10/15/2018 1035 Last data filed at 10/14/2018 2103 Gross per 24 hour  Intake 384.3 ml  Output 375 ml  Net 9.3 ml      PHYSICAL EXAM  General: Well developed, well nourished, in no acute distress HEENT:  Normocephalic and atramatic Neck:  No JVD.  Lungs: Clear bilaterally to auscultation and percussion. Heart: Bradycardia. Normal S1 and S2 without gallops or murmurs.  Abdomen: Bowel sounds are positive, abdomen soft and non-tender  Msk:  Back normal, normal gait. Normal strength and tone for age. Extremities: No clubbing, cyanosis or edema.   Neuro: Alert and oriented X 3. Psych:  Good affect, responds appropriately   LABS: Basic Metabolic Panel: Recent Labs    10/13/18 1251 10/14/18 0550  NA 140 140  K 4.0 3.5  CL 103 104  CO2 26 23  GLUCOSE 127* 93  BUN 43* 45*  CREATININE 1.28* 1.07  CALCIUM 9.1 8.3*   Liver Function Tests: No results for input(s): AST, ALT, ALKPHOS, BILITOT, PROT, ALBUMIN in the last 72 hours. No results for input(s): LIPASE, AMYLASE in the last 72 hours. CBC: Recent Labs    10/13/18 1251 10/14/18 0550  WBC 9.2 7.5  HGB 12.1* 11.1*  HCT 37.3* 34.0*  MCV 100.5* 100.9*  PLT 248 201   Cardiac Enzymes: Recent Labs    10/13/18 1251  TROPONINI 0.04*   BNP: Invalid input(s): POCBNP D-Dimer: No results for input(s): DDIMER in the last 72 hours. Hemoglobin A1C: No results for input(s): HGBA1C in the last 72 hours. Fasting Lipid Panel: No results for input(s): CHOL, HDL, LDLCALC, TRIG, CHOLHDL, LDLDIRECT in the last 72 hours. Thyroid Function Tests: Recent  Labs    10/13/18 1251  TSH 8.029*   Anemia Panel: No results for input(s): VITAMINB12, FOLATE, FERRITIN, TIBC, IRON, RETICCTPCT in the last 72 hours.  Dg Chest 2 View  Result Date: 10/13/2018 CLINICAL DATA:  Bradycardia. Shortness of breath. EXAM: CHEST - 2 VIEW COMPARISON:  10/30/2016 FINDINGS: The cardiac silhouette is mildly to moderately enlarged. Aortic atherosclerosis is noted. There is mild linear opacity in the left lung base and left midlung. There is blunting of the costophrenic angles posteriorly which could reflect trace pleural effusions. No pneumothorax or edema is identified. No acute osseous abnormality is seen. IMPRESSION: 1. Cardiomegaly with left mid and lower lung subsegmental atelectasis. 2. Possible trace bilateral pleural effusions. Electronically Signed   By: [name] Bores M.D.   On: 10/13/2018 14:17     Echo LVEF 60 to 65%  TELEMETRY: Atrial fibrillation with slow ventricular rate 30 bpm:  ASSESSMENT AND PLAN:  Active Problems:   Severe sinus bradycardia    1.  Atrial fibrillation with slow ventricular rate  Recommendations  1.  Micra leadless pacemaker scheduled for 10/05/2018 2.  Hold warfarin ( INR 2.4 10/15/2018 )   Isaias Cowman, MD, PhD, [facility] 10/15/2018 10:35 AM

## 2018-10-15 NOTE — Progress Notes (Addendum)
[place] at [place] NAME: Gabriel Delacruz    MR#:  [id number]  DATE OF BIRTH:  Sep 19, 1927  SUBJECTIVE:  CHIEF COMPLAINT:   Patient without complaint, for pacemaker placement later today  REVIEW OF SYSTEMS:  CONSTITUTIONAL: No fever, fatigue or weakness.  EYES: No blurred or double vision.  EARS, NOSE, AND THROAT: No tinnitus or ear pain.  RESPIRATORY: No cough, shortness of breath, wheezing or hemoptysis.  CARDIOVASCULAR: No chest pain, orthopnea, edema.  GASTROINTESTINAL: No nausea, vomiting, diarrhea or abdominal pain.  GENITOURINARY: No dysuria, hematuria.  ENDOCRINE: No polyuria, nocturia,  HEMATOLOGY: No anemia, easy bruising or bleeding SKIN: No rash or lesion. MUSCULOSKELETAL: No joint pain or arthritis.   NEUROLOGIC: No tingling, numbness, weakness.  PSYCHIATRY: No anxiety or depression.   ROS  DRUG ALLERGIES:  No Known Allergies  VITALS:  Blood pressure 140/72, pulse (!) 30, temperature 97.7 F (36.5 C), temperature source Oral, resp. rate 18, height 5\' 10"  (1.778 m), weight 78.8 kg, SpO2 100 %.  PHYSICAL EXAMINATION:  GENERAL:  85 y.o.-year-old patient lying in the bed with no acute distress.  EYES: Pupils equal, round, reactive to light and accommodation. No scleral icterus. Extraocular muscles intact.  HEENT: Head atraumatic, normocephalic. Oropharynx and nasopharynx clear.  NECK:  Supple, no jugular venous distention. No thyroid enlargement, no tenderness.  LUNGS: Normal breath sounds bilaterally, no wheezing, rales,rhonchi or crepitation. No use of accessory muscles of respiration.  CARDIOVASCULAR: S1, S2 normal. No murmurs, rubs, or gallops.  ABDOMEN: Soft, nontender, nondistended. Bowel sounds present. No organomegaly or mass.  EXTREMITIES: No pedal edema, cyanosis, or clubbing.  NEUROLOGIC: Cranial nerves II through XII are intact. Muscle strength 5/5 in all extremities. Sensation intact. Gait not checked.   PSYCHIATRIC: The patient is alert and oriented x 3.  SKIN: No obvious rash, lesion, or ulcer.   Physical Exam LABORATORY PANEL:   CBC Recent Labs  Lab 10/14/18 0550  WBC 7.5  HGB 11.1*  HCT 34.0*  PLT 201   ------------------------------------------------------------------------------------------------------------------  Chemistries  Recent Labs  Lab 10/14/18 0550  NA 140  K 3.5  CL 104  CO2 23  GLUCOSE 93  BUN 45*  CREATININE 1.07  CALCIUM 8.3*   ------------------------------------------------------------------------------------------------------------------  Cardiac Enzymes Recent Labs  Lab 10/13/18 1251  TROPONINI 0.04*   ------------------------------------------------------------------------------------------------------------------  RADIOLOGY:  Dg Chest 2 View  Result Date: 10/13/2018 CLINICAL DATA:  Bradycardia. Shortness of breath. EXAM: CHEST - 2 VIEW COMPARISON:  10/30/2016 FINDINGS: The cardiac silhouette is mildly to moderately enlarged. Aortic atherosclerosis is noted. There is mild linear opacity in the left lung base and left midlung. There is blunting of the costophrenic angles posteriorly which could reflect trace pleural effusions. No pneumothorax or edema is identified. No acute osseous abnormality is seen. IMPRESSION: 1. Cardiomegaly with left mid and lower lung subsegmental atelectasis. 2. Possible trace bilateral pleural effusions. Electronically Signed   By: [name] Bores M.D.   On: 10/13/2018 14:17    ASSESSMENT AND PLAN:  *Severe persistent bradycardia Stable Cardiology input appreciated, echocardiogram noted for normal ejection fraction, cardiology/Dr. Paraschos-for pacemaker placement later today, continue to hold Coumadin  *Acute kidney injury  Resolved with IV fluids for rehydration Lasix/losartan/hydrochlorothiazide held initially-restarted with development of congestive heart failure exacerbation on October 15, 2018   *Acute on  chronic diastolic congestive heart failure exacerbation Improved Exacerbated by bradycardia Continue losartan, continue Lasix, strict I&O monitoring, daily weights Echocardiogram noted for preserved left ventricular systolic function/ejection fraction  *History  of A. Fib Continue to hold beta-blocker therapy given severe bradycardia Coumadin on hold for possible pacer placement   *Acute elevated troponins Stable  Cardiology input appreciated  Suspect secondary to bradycardia/demand ischemia, congestive heart failure  *COPD without exacerbation  Breathing treatments PRN   Disposition pending clinical course 1-2 days with cardiology clearance    All the records are reviewed and case discussed with Care Management/Social Workerr. Management plans discussed with the patient, family and they are in agreement.  CODE STATUS: full  TOTAL TIME TAKING CARE OF THIS PATIENT: 35 minutes.   POSSIBLE D/C IN 1-2 DAYS, DEPENDING ON CLINICAL CONDITION.   Avel Peace Salary M.D on 10/15/2018   Between 7am to 6pm - Pager - [phone]  After 6pm go to [email or web address] - password [email or web address] [place] [facility]  Office  [phone]  CC: Primary care physician; Marinda Elk, MD  Note: This dictation was prepared with Dragon dictation along with smaller phrase technology. Any transcriptional errors that result from this process are unintentional.

## 2018-10-15 NOTE — Plan of Care (Signed)
  Problem: Health Behavior/Discharge Planning: Goal: Ability to manage health-related needs will improve Outcome: Progressing Note:  Patient for pacemaker placement tomorrow. Educated by Dr. Saralyn Pilar of the risks and benefits. Patient willing to proceed. Will most likely release signed and held orders this afternoon (which haven't yet been released, for unknown reasons). Patient's [employer] only [employer] and holding. Will continue to monitor for any S&S. Wenda Low [place] [facility]

## 2018-10-16 ENCOUNTER — Encounter
Admission: EM | Disposition: A | Discharge status: Skilled Nursing Facility | Payer: Self-pay | Source: Ambulatory Visit | Attending: Family Medicine

## 2018-10-16 HISTORY — PX: PACEMAKER LEADLESS INSERTION: EP1219

## 2018-10-16 HISTORY — PX: TEMPORARY PACEMAKER: CATH118268

## 2018-10-16 LAB — BASIC METABOLIC PANEL
Anion gap: 10 (ref 5–15)
BUN: 47 mg/dL — ABNORMAL HIGH (ref 8–23)
CO2: 25 mmol/L (ref 22–32)
Calcium: 8.6 mg/dL — ABNORMAL LOW (ref 8.9–10.3)
Chloride: 104 mmol/L (ref 98–111)
Creatinine, Ser: 1.06 mg/dL (ref 0.61–1.24)
GFR calc Af Amer: >60 mL/min (ref >60)
GFR calc non Af Amer: >60 mL/min (ref >60)
Glucose, Bld: 136 mg/dL — ABNORMAL HIGH (ref 70–99)
Potassium: 4.3 mmol/L (ref 3.5–5.1)
Sodium: 139 mmol/L (ref 135–145)

## 2018-10-16 LAB — PROTIME-INR
INR: 2.2 — ABNORMAL HIGH (ref 0.8–1.2)
Prothrombin Time: 24.5 seconds — ABNORMAL HIGH (ref 11.4–15.2)

## 2018-10-16 SURGERY — PACEMAKER LEADLESS INSERTION
Anesthesia: Moderate Sedation

## 2018-10-16 MED ORDER — ACETAMINOPHEN 325 MG PO TABS
650.0000 mg | ORAL_TABLET | ORAL | Status: DC | PRN
  Administered 2018-10-17: 650 mg via ORAL
  Filled 2018-10-16: qty 2

## 2018-10-16 MED ORDER — FENTANYL CITRATE (PF) 100 MCG/2ML IJ SOLN
INTRAMUSCULAR | Status: DC | PRN
  Administered 2018-10-16: 25 ug via INTRAVENOUS

## 2018-10-16 MED ORDER — SODIUM CHLORIDE 0.9 % IV SOLN: 250.0000 mL | INTRAVENOUS | Status: DC | PRN

## 2018-10-16 MED ORDER — ONDANSETRON HCL 4 MG/2ML IJ SOLN: 4.0000 mg | Freq: Four times a day (QID) | INTRAMUSCULAR | Status: DC | PRN

## 2018-10-16 MED ORDER — MIDAZOLAM HCL 2 MG/2ML IJ SOLN
INTRAMUSCULAR | Status: AC
  Filled 2018-10-16: qty 2

## 2018-10-16 MED ORDER — MIDAZOLAM HCL 2 MG/2ML IJ SOLN
INTRAMUSCULAR | Status: DC | PRN
  Administered 2018-10-16: 0.5 mg via INTRAVENOUS

## 2018-10-16 MED ORDER — HEPARIN (PORCINE) IN NACL 1000-0.9 UT/500ML-% IV SOLN
INTRAVENOUS | Status: DC | PRN
  Administered 2018-10-16: 500 mL

## 2018-10-16 MED ORDER — HEPARIN SODIUM (PORCINE) 1000 UNIT/ML IJ SOLN
INTRAMUSCULAR | Status: AC
  Filled 2018-10-16: qty 1

## 2018-10-16 MED ORDER — SODIUM CHLORIDE 0.9% FLUSH: 3.0000 mL | INTRAVENOUS | Status: DC | PRN

## 2018-10-16 MED ORDER — IOPAMIDOL (ISOVUE-300) INJECTION 61%
INTRAVENOUS | Status: DC | PRN
  Administered 2018-10-16: 35 mL via INTRAVENOUS

## 2018-10-16 MED ORDER — SODIUM CHLORIDE 0.9% FLUSH
3.0000 mL | Freq: Two times a day (BID) | INTRAVENOUS | Status: DC
  Administered 2018-10-17 – 2018-10-19 (×5): 3 mL via INTRAVENOUS

## 2018-10-16 MED ORDER — HEPARIN SODIUM (PORCINE) 1000 UNIT/ML IJ SOLN
INTRAMUSCULAR | Status: DC | PRN
  Administered 2018-10-16: 2000 [IU] via INTRAVENOUS
  Administered 2018-10-16: 3000 [IU] via INTRAVENOUS

## 2018-10-16 MED ORDER — FENTANYL CITRATE (PF) 100 MCG/2ML IJ SOLN
INTRAMUSCULAR | Status: AC
  Filled 2018-10-16: qty 2

## 2018-10-16 SURGICAL SUPPLY — 20 items
CABLE ADAPT PACING TEMP 12FT (ADAPTER) ×4 IMPLANT
CANNULA 5F STIFF (CANNULA) ×4 IMPLANT
DILATOR VESSEL 38 20CM 12FR (INTRODUCER) ×4 IMPLANT
DILATOR VESSEL 38 20CM 14FR (INTRODUCER) ×4 IMPLANT
DILATOR VESSEL 38 20CM 18FR (INTRODUCER) ×4 IMPLANT
DILATOR VESSEL 38 20CM 8FR (INTRODUCER) ×4 IMPLANT
GUIDEWIRE SUPER STIFF .035X180 (WIRE) ×4 IMPLANT
MICRA INTRODUCER SHEATH (SHEATH) ×4
MICRA TRANSCATH PACING SYSTEM (Pacemaker) ×4 IMPLANT
NEEDLE PERC 18GX7CM (NEEDLE) ×4 IMPLANT
PACK CARDIAC CATH (CUSTOM PROCEDURE TRAY) ×4 IMPLANT
PAD ONESTEP ZOLL R SERIES ADT (MISCELLANEOUS) ×4 IMPLANT
SHEATH AVANTI 6FR X 11CM (SHEATH) ×4 IMPLANT
SHEATH AVANTI 7FRX11 (SHEATH) ×4 IMPLANT
SHEATH INTRODUCER MICRA (SHEATH) ×2 IMPLANT
SLEEVE REPOSITIONING LENGTH 30 (MISCELLANEOUS) ×4 IMPLANT
SUT SILK 0 FSL (SUTURE) ×8 IMPLANT
SYSTEM PACING TRNSCTH MICRA (Pacemaker) ×2 IMPLANT
WIRE GUIDERIGHT .035X150 (WIRE) ×4 IMPLANT
WIRE PACING TEMP ST TIP 5 (CATHETERS) ×4 IMPLANT

## 2018-10-16 NOTE — Plan of Care (Signed)
Afib with slow ventricular response continues on telemetry.

## 2018-10-16 NOTE — Progress Notes (Signed)
PT Cancellation Note  Patient Details Name: Gabriel Delacruz MRN: [id number] DOB: 1928/07/15   Cancelled Treatment:    Reason Eval/Treat Not Completed: Medical issues which prohibited therapy. Son was present when PT arrived, pt is leaving imminently for pacemaker insertion. He is having HR in 30's so will hold eval, but need to see how he functions without LUE to assist.  Pt on SPC baseline, lives alone with OA in L knee hindering mobility.  Son asking to be there tomorrow for eval.     Ramond Dial 10/16/2018, 11:21 AM  Mee Hives, PT MS Acute Rehab Dept. Number: [place] and [place]

## 2018-10-16 NOTE — Care Management Important Message (Signed)
Copy of signed Medicare IM left with patient in room. 

## 2018-10-16 NOTE — Progress Notes (Signed)
Pt complains of feeling short of breath. Oxygen sat is 99% on room air, lungs show some fine crackles to bases without any wheeze. Pt appears in no respiratory distress. RN will give prn duoneb. I will continue to assess.

## 2018-10-16 NOTE — Progress Notes (Signed)
More oozing noted at left femoral site. RN and oncoming RN changed PAD and add 40cc of air. RN will continue to assess.

## 2018-10-16 NOTE — Progress Notes (Signed)
Pt returns s/p leadless pacemaker. Right and left groins were used. Left groin has a PAD for 24 hrs. The right groin is clean, dry and intact with pulses positive bilaterally. The left groin has blood between scrotum and leg. RN cleans this area and applies cloth to absorb any drainage. The left side does not appear to be oozing any more. VSS, no complaints of pain. I will continue to assess.

## 2018-10-16 NOTE — Progress Notes (Signed)
Slight trickle of blood to the left groin, pressure was held for almost 20 mins with continuous bleeding. Hal, RN with cath lab placed a PAD with 40 ml of air. Dr. Saralyn Pilar was at the bedside, agreed with PAD placement. No further intervention needed.

## 2018-10-17 ENCOUNTER — Encounter: Payer: Self-pay | Admitting: Cardiology

## 2018-10-17 LAB — BASIC METABOLIC PANEL
Anion gap: 9 (ref 5–15)
BUN: 35 mg/dL — ABNORMAL HIGH (ref 8–23)
CO2: 23 mmol/L (ref 22–32)
Calcium: 8.4 mg/dL — ABNORMAL LOW (ref 8.9–10.3)
Chloride: 106 mmol/L (ref 98–111)
Creatinine, Ser: 0.78 mg/dL (ref 0.61–1.24)
GFR calc non Af Amer: >60 mL/min (ref >60)
Glucose, Bld: 106 mg/dL — ABNORMAL HIGH (ref 70–99)
Potassium: 3.8 mmol/L (ref 3.5–5.1)
Sodium: 138 mmol/L (ref 135–145)

## 2018-10-17 LAB — PROTIME-INR
INR: 1.5 — ABNORMAL HIGH (ref 0.8–1.2)
Prothrombin Time: 17.7 seconds — ABNORMAL HIGH (ref 11.4–15.2)

## 2018-10-17 MED ORDER — WARFARIN SODIUM 7.5 MG PO TABS
7.5000 mg | ORAL_TABLET | Freq: Once | ORAL | Status: AC
  Administered 2018-10-17: 7.5 mg via ORAL
  Filled 2018-10-17: qty 1

## 2018-10-17 MED ORDER — FUROSEMIDE 10 MG/ML IJ SOLN
20.0000 mg | Freq: Two times a day (BID) | INTRAMUSCULAR | Status: DC
  Administered 2018-10-17 – 2018-10-18 (×2): 20 mg via INTRAVENOUS
  Filled 2018-10-17 (×2): qty 2

## 2018-10-17 MED ORDER — WARFARIN - PHARMACIST DOSING INPATIENT
Freq: Every day | Status: DC
  Administered 2018-10-18: 19:00:00

## 2018-10-17 NOTE — NC FL2 (Signed)
[place] LEVEL OF CARE SCREENING TOOL     IDENTIFICATION  Patient Name: Gabriel Delacruz Birthdate: [birth year] Sex: male Admission Date (Current Location): 10/13/2018  [place] and [place] Number:  Engineering geologist and Address:  [facility], [address], [place], [place] [address]      Provider Number: [id number]  Attending Physician Name and Address:  Gorden Harms, MD  Relative Name and Phone Number:  [place], [place] Daughter [phone] or [place] [phone] or junie, engram   [phone]     Current Level of Care: Hospital Recommended Level of Care: [place] Prior Approval Number:    Date Approved/Denied:   PASRR Number: [id number]  Discharge Plan: SNF    Current Diagnoses: Patient Active Problem List   Diagnosis Date Noted  . Severe sinus bradycardia 10/13/2018  . MGUS (monoclonal gammopathy of unknown significance) 12/11/2015    Orientation RESPIRATION BLADDER Height & Weight     Self, Time, Situation, Place  Normal Continent Weight: 173 lb 11.6 oz (78.8 kg) Height:  5\' 10"  (177.8 cm)  BEHAVIORAL SYMPTOMS/MOOD NEUROLOGICAL BOWEL NUTRITION STATUS      Incontinent Diet(Cardiac diet)  AMBULATORY STATUS COMMUNICATION OF NEEDS Skin   Limited Assist Verbally Normal                       Personal Care Assistance Level of Assistance  Bathing, Feeding, Dressing Bathing Assistance: Limited assistance Feeding assistance: Independent Dressing Assistance: Limited assistance     Functional Limitations Info  Sight, Hearing, Speech Sight Info: Adequate Hearing Info: Adequate Speech Info: Adequate    SPECIAL CARE FACTORS FREQUENCY  PT (By licensed PT), OT (By licensed OT)     PT Frequency: Minimum 5x a week OT Frequency: Minimum 5x a week            Contractures Contractures Info: Not present    Additional Factors Info  Code Status, Allergies Code Status Info: Full  Code Allergies Info: No Known Allergies            Current Medications (10/17/2018):  This is the current hospital active medication list Current Facility-Administered Medications  Medication Dose Route Frequency Provider Last Rate Last Dose  . 0.9 %  sodium chloride infusion  250 mL Intravenous PRN Demetrios Loll, MD   Stopped at 10/14/18 1522  . 0.9 %  sodium chloride infusion  250 mL Intravenous PRN Paraschos, Alexander, MD      . acetaminophen (TYLENOL) tablet 650 mg  650 mg Oral Q4H PRN Paraschos, Alexander, MD      . atropine 1 MG/10ML injection 1 mg  1 mg Intravenous PRN Fran Lowes, NP      . bisacodyl (DULCOLAX) EC tablet 5 mg  5 mg Oral Daily PRN Demetrios Loll, MD      . dorzolamide-timolol (COSOPT) 22.3-6.8 MG/ML ophthalmic solution 1 drop  1 drop Both Eyes BID Demetrios Loll, MD   1 drop at 10/17/18 1016  . furosemide (LASIX) injection 20 mg  20 mg Intravenous BID Loney Hering D, MD   20 mg at 10/17/18 1723  . HYDROcodone-acetaminophen (NORCO/VICODIN) 5-325 MG per tablet 1-2 tablet  1-2 tablet Oral Q4H PRN Demetrios Loll, MD   1 tablet at 10/16/18 0803  . ipratropium-albuterol (DUONEB) 0.5-2.5 (3) MG/3ML nebulizer solution 3 mL  3 mL Nebulization Q6H PRN Salary, Montell D, MD   3 mL at 10/16/18 0956  . latanoprost (XALATAN) 0.005 % ophthalmic solution 1 drop  1 drop Both Eyes QHS Demetrios Loll, MD   1 drop at 10/16/18 2036  . losartan (COZAAR) tablet 50 mg  50 mg Oral Daily Salary, Montell D, MD   50 mg at 10/17/18 0903  . mometasone-formoterol (DULERA) 200-5 MCG/ACT inhaler 2 puff  2 puff Inhalation BID Demetrios Loll, MD   2 puff at 10/17/18 1015  . ondansetron (ZOFRAN) tablet 4 mg  4 mg Oral Q6H PRN Demetrios Loll, MD       Or  . ondansetron [facility]) injection 4 mg  4 mg Intravenous Q6H PRN Demetrios Loll, MD      . potassium chloride (K-DUR,KLOR-CON) CR tablet 10 mEq  10 mEq Oral Daily Demetrios Loll, MD   10 mEq at 10/17/18 0904  . senna-docusate (Senokot-S) tablet 1 tablet  1 tablet Oral QHS PRN Demetrios Loll, MD      . sodium chloride flush (NS) 0.9 % injection 3 mL  3 mL Intravenous Once Schuyler Amor, MD      . sodium chloride flush (NS) 0.9 % injection 3 mL  3 mL Intravenous Q12H Demetrios Loll, MD   3 mL at 10/17/18 1016  . sodium chloride flush (NS) 0.9 % injection 3 mL  3 mL Intravenous PRN Demetrios Loll, MD      . sodium chloride flush (NS) 0.9 % injection 3 mL  3 mL Intravenous Q12H Paraschos, Alexander, MD   3 mL at 10/17/18 1016  . sodium chloride flush (NS) 0.9 % injection 3 mL  3 mL Intravenous PRN Paraschos, Alexander, MD      . Warfarin - Pharmacist Dosing Inpatient   Does not apply q1800 Pernell Dupre, [facility]         Discharge Medications: Please see discharge summary for a list of discharge medications.  Relevant Imaging Results:  Relevant Lab Results:   Additional Information SSN [id number]  Ross Ludwig, LCSW

## 2018-10-17 NOTE — Evaluation (Signed)
Physical Therapy Evaluation Patient Details Name: Gabriel Delacruz MRN: [id number] DOB: 1927-09-09 Today's Date: 10/17/2018   History of Present Illness  85 y.o. male with a known history of hypertension, A. fib,, COPD, GERD, anemia, hypertension, hyperlipidemia, hypothyroidism glaucoma. Came here with bradycardia and had pacemaker placed 3/2.  Clinical Impression  Pt is able to rise to sitting EOB with some effort and UE use on rails.  He did manage to don a sock but had a harder (and more unsteady) time than baseline.  He did show good effort with entire session and with bout of ambulation.  Pt had issues with being much slower and less steady than his baseline ambulation, he is keen to go home but recognizes that he will likely need to go to rehab to build up strength and stability before being home alone.     Follow Up Recommendations SNF    Equipment Recommendations  Rolling walker with 5" wheels(per progress at rehab)    Recommendations for Other Services       Precautions / Restrictions Precautions Precautions: Fall Restrictions Weight Bearing Restrictions: No      Mobility  Bed Mobility Overal bed mobility: Modified Independent             General bed mobility comments: Pt slow to get to EOB, but was able to rise w/o direct assist with heavy UE use on rails  Transfers Overall transfer level: Needs assistance Equipment used: Straight cane             General transfer comment: Pt is able to rise to standing with only light assist to keep weight forward.  Some initial unsteadiness with SPC, but able to maintain balance w/o direct assist.  Ambulation/Gait Ambulation/Gait assistance: Min assist Gait Distance (Feet): 55 Feet Assistive device: Straight cane       General Gait Details: Pt typically independent and confident with cane, today he showed consistent small-scale stagger stepping with no full LOBs.  PT giving close CGA and occasional need to provide  tactile cuing to stay upright.  Would suggest RW next attempt at ambulation.  Stairs            Wheelchair Mobility    Modified Rankin (Stroke Patients Only)       Balance Overall balance assessment: Needs assistance Sitting-balance support: Single extremity supported Sitting balance-Leahy Scale: Good       Standing balance-Leahy Scale: Fair Standing balance comment: Pt with reasonable static standing balance but definite unsteadiness with most activity even with baseline cane                             Pertinent Vitals/Pain Pain Assessment: No/denies pain    Home Living Family/patient expects to be discharged to:: Skilled nursing facility Living Arrangements: Alone                    Prior Function Level of Independence: Independent with assistive device(s)         Comments: pt uses cane, drives to meals daily, apparently has done reasonably well until recently having lower energy (brady associated)     Hand Dominance        Extremity/Trunk Assessment   Upper Extremity Assessment Upper Extremity Assessment: Generalized weakness    Lower Extremity Assessment Lower Extremity Assessment: Generalized weakness       Communication   Communication: No difficulties  Cognition Arousal/Alertness: Awake/alert Behavior During Therapy: WFL for tasks assessed/performed  Overall Cognitive Status: Within Functional Limits for tasks assessed                                        General Comments      Exercises     Assessment/Plan    PT Assessment Patient needs continued PT services  PT Problem List Decreased strength;Decreased activity tolerance;Decreased mobility;Decreased balance;Decreased knowledge of use of DME;Decreased safety awareness       PT Treatment Interventions Therapeutic exercise;Functional mobility training;DME instruction;Gait training;Therapeutic activities;Balance training    PT Goals (Current  goals can be found in the Care Plan section)  Acute Rehab PT Goals Patient Stated Goal: get stronger and then go home PT Goal Formulation: With patient Time For Goal Achievement: 10/31/18 Potential to Achieve Goals: Good    Frequency Min 2X/week   Barriers to discharge        Co-evaluation               AM-PAC PT "6 Clicks" Mobility  Outcome Measure Help needed turning from your back to your side while in a flat bed without using bedrails?: A Little Help needed moving from lying on your back to sitting on the side of a flat bed without using bedrails?: A Little Help needed moving to and from a bed to a chair (including a wheelchair)?: A Little Help needed standing up from a chair using your arms (e.g., wheelchair or bedside chair)?: A Little Help needed to walk in hospital room?: A Little Help needed climbing 3-5 steps with a railing? : A Lot 6 Click Score: 17    End of Session Equipment Utilized During Treatment: Gait belt Activity Tolerance: Patient tolerated treatment well Patient left: with chair alarm set;with call bell/phone within reach Nurse Communication: Mobility status PT Visit Diagnosis: Muscle weakness (generalized) (M62.81);Difficulty in walking, not elsewhere classified (R26.2);Unsteadiness on feet (R26.81)    Time: 1029-1100 PT Time Calculation (min) (ACUTE ONLY): 31 min   Charges:   PT Evaluation $PT Eval Low Complexity: 1 Low PT Treatments $Gait Training: 8-22 mins        Kreg Shropshire, DPT 10/17/2018, 1:20 PM

## 2018-10-17 NOTE — Progress Notes (Signed)
[place] at [place] NAME: Gabriel Delacruz    MR#:  [id number]  DATE OF BIRTH:  [birth year]  SUBJECTIVE:  CHIEF COMPLAINT:   Patient without complaint, for pacemaker placement later today  REVIEW OF SYSTEMS:  CONSTITUTIONAL: No fever, fatigue or weakness.  EYES: No blurred or double vision.  EARS, NOSE, AND THROAT: No tinnitus or ear pain.  RESPIRATORY: No cough, shortness of breath, wheezing or hemoptysis.  CARDIOVASCULAR: No chest pain, orthopnea, edema.  GASTROINTESTINAL: No nausea, vomiting, diarrhea or abdominal pain.  GENITOURINARY: No dysuria, hematuria.  ENDOCRINE: No polyuria, nocturia,  HEMATOLOGY: No anemia, easy bruising or bleeding SKIN: No rash or lesion. MUSCULOSKELETAL: No joint pain or arthritis.   NEUROLOGIC: No tingling, numbness, weakness.  PSYCHIATRY: No anxiety or depression.   ROS  DRUG ALLERGIES:  No Known Allergies  VITALS:  Blood pressure (!) 147/80, pulse 70, temperature 98.3 F (36.8 C), temperature source Oral, resp. rate 18, height 5\' 10"  (1.778 m), weight 78.8 kg, SpO2 100 %.  PHYSICAL EXAMINATION:  GENERAL:  85 y.o.-year-old patient lying in the bed with no acute distress.  EYES: Pupils equal, round, reactive to light and accommodation. No scleral icterus. Extraocular muscles intact.  HEENT: Head atraumatic, normocephalic. Oropharynx and nasopharynx clear.  NECK:  Supple, no jugular venous distention. No thyroid enlargement, no tenderness.  LUNGS: Normal breath sounds bilaterally, no wheezing, rales,rhonchi or crepitation. No use of accessory muscles of respiration.  CARDIOVASCULAR: S1, S2 normal. No murmurs, rubs, or gallops.  ABDOMEN: Soft, nontender, nondistended. Bowel sounds present. No organomegaly or mass.  EXTREMITIES: No pedal edema, cyanosis, or clubbing.  NEUROLOGIC: Cranial nerves II through XII are intact. Muscle strength 5/5 in all extremities. Sensation intact. Gait not checked.   PSYCHIATRIC: The patient is alert and oriented x 3.  SKIN: No obvious rash, lesion, or ulcer.   Physical Exam LABORATORY PANEL:   CBC Recent Labs  Lab 10/14/18 0550  WBC 7.5  HGB 11.1*  HCT 34.0*  PLT 201   ------------------------------------------------------------------------------------------------------------------  Chemistries  Recent Labs  Lab 10/17/18 0622  NA 138  K 3.8  CL 106  CO2 23  GLUCOSE 106*  BUN 35*  CREATININE 0.78  CALCIUM 8.4*   ------------------------------------------------------------------------------------------------------------------  Cardiac Enzymes Recent Labs  Lab 10/13/18 1251  TROPONINI 0.04*   ------------------------------------------------------------------------------------------------------------------  RADIOLOGY:  Dg Chest 2 View  Result Date: 10/15/2018 CLINICAL DATA:  Cough, congestion EXAM: CHEST - 2 VIEW COMPARISON:  10/13/2018 FINDINGS: Bilateral mild interstitial thickening. No pleural effusion or pneumothorax. No focal consolidation. Stable cardiomegaly. No acute osseous abnormality. Loss of the normal acromion humeral distance bilaterally as can be seen with rotator cuff tears. IMPRESSION: Cardiomegaly with mild pulmonary vascular congestion. Electronically Signed   By: Kathreen Devoid   On: 10/15/2018 14:27    ASSESSMENT AND PLAN:  *Severe persistent bradycardia Stable Cardiology input appreciated, echocardiogram noted for normal ejection fraction, cardiology/Dr. Paraschos-for pacemaker placement later today, continue to hold Coumadin  *Acute kidney injury  Resolved with IV fluids for rehydration Lasix/losartan/hydrochlorothiazide held initially-restarted with development of congestive heart failure exacerbation on October 15, 2018   *Acute on chronic diastolic congestive heart failure exacerbation Improved Exacerbated by bradycardia Continue losartan, continue Lasix, strict I&O monitoring, daily  weights Echocardiogram noted for preserved left ventricular systolic function/ejection fraction  *History of A. Fib Continue to hold beta-blocker therapy given severe bradycardia Coumadin on hold for possible pacer placement   *Acute elevated troponins Stable  Cardiology input appreciated  Suspect  secondary to bradycardia/demand ischemia, congestive heart failure  *COPD without exacerbation  Breathing treatments PRN   Disposition pending clinical course 1-2 days with cardiology clearance    All the records are reviewed and case discussed with Care Management/Social Workerr. Management plans discussed with the patient, family and they are in agreement.  CODE STATUS: full  TOTAL TIME TAKING CARE OF THIS PATIENT: 35 minutes.   POSSIBLE D/C IN 1-2 DAYS, DEPENDING ON CLINICAL CONDITION.   Avel Peace  M.D on 10/17/2018   Between 7am to 6pm - Pager - [phone]  After 6pm go to [email or web address] - password [email or web address] [place] [facility]  Office  [phone]  CC: Primary care physician; Marinda Elk, MD  Note: This dictation was prepared with Dragon dictation along with smaller phrase technology. Any transcriptional errors that result from this process are unintentional.

## 2018-10-17 NOTE — Progress Notes (Signed)
[facility] Cardiology  SUBJECTIVE: Patient laying in bed, denies chest pain or shortness of breath   Vitals:   10/16/18 1725 10/16/18 1750 10/16/18 1939 10/17/18 0739  BP: (!) 154/79 (!) 148/71 (!) 155/87 (!) 147/80  Pulse: 72 70 70 70  Resp: (!) 24   18  Temp:  98.5 F (36.9 C) 98 F (36.7 C) 98.3 F (36.8 C)  TempSrc:  Oral Oral Oral  SpO2: 97% 100% 98% 100%  Weight:      Height:         Intake/Output Summary (Last 24 hours) at 10/17/2018 0810 Last data filed at 10/17/2018 0505 Gross per 24 hour  Intake 480 ml  Output 600 ml  Net -120 ml      PHYSICAL EXAM  General: Well developed, well nourished, in no acute distress HEENT:  Normocephalic and atramatic Neck:  No JVD.  Lungs: Clear bilaterally to auscultation and percussion. Heart: HRRR . Normal S1 and S2 without gallops or murmurs.  Abdomen: Bowel sounds are positive, abdomen soft and non-tender  Msk:  Back normal, normal gait. Normal strength and tone for age. Extremities: No clubbing, cyanosis or edema.   Neuro: Alert and oriented X 3. Psych:  Good affect, responds appropriately   LABS: Basic Metabolic Panel: Recent Labs    10/16/18 0453 10/17/18 0622  NA 139 138  K 4.3 3.8  CL 104 106  CO2 25 23  GLUCOSE 136* 106*  BUN 47* 35*  CREATININE 1.06 0.78  CALCIUM 8.6* 8.4*   Liver Function Tests: No results for input(s): AST, ALT, ALKPHOS, BILITOT, PROT, ALBUMIN in the last 72 hours. No results for input(s): LIPASE, AMYLASE in the last 72 hours. CBC: No results for input(s): WBC, NEUTROABS, HGB, HCT, MCV, PLT in the last 72 hours. Cardiac Enzymes: No results for input(s): CKTOTAL, CKMB, CKMBINDEX, TROPONINI in the last 72 hours. BNP: Invalid input(s): POCBNP D-Dimer: No results for input(s): DDIMER in the last 72 hours. Hemoglobin A1C: No results for input(s): HGBA1C in the last 72 hours. Fasting Lipid Panel: No results for input(s): CHOL, HDL, LDLCALC, TRIG, CHOLHDL, LDLDIRECT in the last 72  hours. Thyroid Function Tests: No results for input(s): TSH, T4TOTAL, T3FREE, THYROIDAB in the last 72 hours.  Invalid input(s): FREET3 Anemia Panel: No results for input(s): VITAMINB12, FOLATE, FERRITIN, TIBC, IRON, RETICCTPCT in the last 72 hours.  Dg Chest 2 View  Result Date: 10/15/2018 CLINICAL DATA:  Cough, congestion EXAM: CHEST - 2 VIEW COMPARISON:  10/13/2018 FINDINGS: Bilateral mild interstitial thickening. No pleural effusion or pneumothorax. No focal consolidation. Stable cardiomegaly. No acute osseous abnormality. Loss of the normal acromion humeral distance bilaterally as can be seen with rotator cuff tears. IMPRESSION: Cardiomegaly with mild pulmonary vascular congestion. Electronically Signed   By: Kathreen Devoid   On: 10/15/2018 14:27     Echo LVEF 60-65%  TELEMETRY: Ventricular pacing:  ASSESSMENT AND PLAN:  Active Problems:   Severe sinus bradycardia    1.  Status post Micra leadless pacemaker for junctional bradycardia  Recommendations  1.  May discharge per cardiology 2.  Follow-up in 1 week   Isaias Cowman, MD, PhD, [facility] 10/17/2018 8:10 AM

## 2018-10-17 NOTE — Progress Notes (Addendum)
[place] at [place] NAME: Gabriel Delacruz    MR#:  [id number]  DATE OF BIRTH:  1928/05/31  SUBJECTIVE:  CHIEF COMPLAINT:   Patient without complaint, status post pacemaker on yesterday, cardiology input appreciated, per physical therapy-for skilled nursing facility placement, family at the bedside REVIEW OF SYSTEMS:  CONSTITUTIONAL: No fever, fatigue or weakness.  EYES: No blurred or double vision.  EARS, NOSE, AND THROAT: No tinnitus or ear pain.  RESPIRATORY: No cough, shortness of breath, wheezing or hemoptysis.  CARDIOVASCULAR: No chest pain, orthopnea, edema.  GASTROINTESTINAL: No nausea, vomiting, diarrhea or abdominal pain.  GENITOURINARY: No dysuria, hematuria.  ENDOCRINE: No polyuria, nocturia,  HEMATOLOGY: No anemia, easy bruising or bleeding SKIN: No rash or lesion. MUSCULOSKELETAL: No joint pain or arthritis.   NEUROLOGIC: No tingling, numbness, weakness.  PSYCHIATRY: No anxiety or depression.   ROS  DRUG ALLERGIES:  No Known Allergies  VITALS:  Blood pressure (!) 147/80, pulse 70, temperature 98.3 F (36.8 C), temperature source Oral, resp. rate 18, height 5\' 10"  (1.778 m), weight 78.8 kg, SpO2 100 %.  PHYSICAL EXAMINATION:  GENERAL:  85 y.o.-year-old patient lying in the bed with no acute distress.  EYES: Pupils equal, round, reactive to light and accommodation. No scleral icterus. Extraocular muscles intact.  HEENT: Head atraumatic, normocephalic. Oropharynx and nasopharynx clear.  NECK:  Supple, no jugular venous distention. No thyroid enlargement, no tenderness.  LUNGS: Normal breath sounds bilaterally, no wheezing, rales,rhonchi or crepitation. No use of accessory muscles of respiration.  CARDIOVASCULAR: S1, S2 normal. No murmurs, rubs, or gallops.  ABDOMEN: Soft, nontender, nondistended. Bowel sounds present. No organomegaly or mass.  EXTREMITIES: No pedal edema, cyanosis, or clubbing.  NEUROLOGIC: Cranial nerves II  through XII are intact. Muscle strength 5/5 in all extremities. Sensation intact. Gait not checked.  PSYCHIATRIC: The patient is alert and oriented x 3.  SKIN: No obvious rash, lesion, or ulcer.   Physical Exam LABORATORY PANEL:   CBC Recent Labs  Lab 10/14/18 0550  WBC 7.5  HGB 11.1*  HCT 34.0*  PLT 201   ------------------------------------------------------------------------------------------------------------------  Chemistries  Recent Labs  Lab 10/17/18 0622  NA 138  K 3.8  CL 106  CO2 23  GLUCOSE 106*  BUN 35*  CREATININE 0.78  CALCIUM 8.4*   ------------------------------------------------------------------------------------------------------------------  Cardiac Enzymes Recent Labs  Lab 10/13/18 1251  TROPONINI 0.04*   ------------------------------------------------------------------------------------------------------------------  RADIOLOGY:  Dg Chest 2 View  Result Date: 10/15/2018 CLINICAL DATA:  Cough, congestion EXAM: CHEST - 2 VIEW COMPARISON:  10/13/2018 FINDINGS: Bilateral mild interstitial thickening. No pleural effusion or pneumothorax. No focal consolidation. Stable cardiomegaly. No acute osseous abnormality. Loss of the normal acromion humeral distance bilaterally as can be seen with rotator cuff tears. IMPRESSION: Cardiomegaly with mild pulmonary vascular congestion. Electronically Signed   By: Kathreen Devoid   On: 10/15/2018 14:27    ASSESSMENT AND PLAN:  *Severe persistent bradycardia Resolved-status post pacemaker placement October 16, 2018 Cardiology input appreciated, echocardiogram noted for normal ejection fraction, cardiology/Dr. Paraschos recommending discharged home, physical therapy did see patient today-recommending skilled nursing facility placement status post discharge, family at the bedside and are aware Restart Coumadin once cleared by cardiology  *Acute kidney injury  Resolved with IV fluids for  rehydration Lasix/losartan/hydrochlorothiazide held initially-restarted with development of congestive heart failure exacerbation on October 15, 2018   *Acute on chronic diastolic congestive heart failure exacerbation Resolving  exacerbated by bradycardia Continue losartan, Lasix, strict I&O monitoring, daily weights Echocardiogram  noted for preserved left ventricular systolic function/ejection fraction  *History of A. Fib beta-blocker therapy discontinued given severe bradycardia Coumadin held for pacemaker placement-restart once cleared by cardiology   *Acute elevated troponins Stable  Cardiology input appreciated  Suspect secondary to bradycardia/demand ischemia, congestive heart failure  *COPD without exacerbation  Breathing treatments PRN   Disposition to skilled nursing facility in 1 to 2 days barring any complications   All the records are reviewed and case discussed with Care Management/Social Workerr. Management plans discussed with the patient, family and they are in agreement.  CODE STATUS: full  TOTAL TIME TAKING CARE OF THIS PATIENT: 35 minutes.   POSSIBLE D/C IN 1-2 DAYS, DEPENDING ON CLINICAL CONDITION.   Avel Peace Salary M.D on 10/17/2018   Between 7am to 6pm - Pager - [phone]  After 6pm go to [email or web address] - password [email or web address] [place] [facility]  Office  [phone]  CC: Primary care physician; Marinda Elk, MD  Note: This dictation was prepared with Dragon dictation along with smaller phrase technology. Any transcriptional errors that result from this process are unintentional.

## 2018-10-17 NOTE — Consult Note (Signed)
ANTICOAGULATION CONSULT NOTE - Initial Consult  Pharmacy Consult for Warfarin Dosing  Indication: atrial fibrillation  No Known Allergies  Patient Measurements: Height: 5\' 10"  (177.8 cm) Weight: 173 lb 11.6 oz (78.8 kg) IBW/kg (Calculated) : 73  Vital Signs: Temp: 98.3 F (36.8 C) (03/03 0739) Temp Source: Oral (03/03 0739) BP: 147/80 (03/03 0739) Pulse Rate: 70 (03/03 0739)  Labs: Recent Labs    10/15/18 0510 10/16/18 0453 10/17/18 0622  LABPROT 25.6* 24.5* 17.7*  INR 2.4* 2.2* 1.5*  CREATININE  --  1.06 0.78    Estimated Creatinine Clearance: 65.9 mL/min (by C-G formula based on SCr of 0.78 mg/dL).   Medical History: Past Medical History:  Diagnosis Date  . Anemia   . Atrial fibrillation ([place])   . COPD (chronic obstructive pulmonary disease) ([place])   . GERD (gastroesophageal reflux disease)   . Glaucoma   . Hyperlipemia   . Hypertension   . Hypothyroidism   . Irregular heart rhythm   . Left anterior fascicular block   . MGUS (monoclonal gammopathy of unknown significance)   . RBBB     Assessment: Pharmacy consulted for warfarin dosing and monitoring in 85 yo male with PMH of A. Fib. Patient takes warfarin 6mg  daily and had a therapeutic INR of 2.6 on admission.  Warfarin has been held inpatient x 5 days and patient received Vitamin K 5mg  x 2 doses prior to pacemake placement on 3/2.   DATE INR DOSE 3/3 1.5   Goal of Therapy:  INR 2-3 Monitor platelets by anticoagulation protocol: Yes   Plan:  3/3  Will order warfarin 7.5mg  x 1 dose tonight. Will plan to restart patient's home regimen of warfarin 6mg  daily tomorrow.  INRs ordered daily until therapeutic x2.  CBC at least every 3 days per protocol.   Pernell Dupre, PharmD, BCPS Clinical Pharmacist 10/17/2018 12:55 PM

## 2018-10-18 LAB — BASIC METABOLIC PANEL
Anion gap: 8 (ref 5–15)
BUN: 28 mg/dL — ABNORMAL HIGH (ref 8–23)
CO2: 24 mmol/L (ref 22–32)
Calcium: 8.1 mg/dL — ABNORMAL LOW (ref 8.9–10.3)
Chloride: 104 mmol/L (ref 98–111)
Creatinine, Ser: 0.83 mg/dL (ref 0.61–1.24)
GFR calc Af Amer: >60 mL/min (ref >60)
Glucose, Bld: 108 mg/dL — ABNORMAL HIGH (ref 70–99)
Potassium: 3.5 mmol/L (ref 3.5–5.1)
Sodium: 136 mmol/L (ref 135–145)

## 2018-10-18 LAB — CBC
HCT: 39.6 % (ref 39.0–52.0)
Hemoglobin: 12.8 g/dL — ABNORMAL LOW (ref 13.0–17.0)
MCH: 32.5 pg (ref 26.0–34.0)
MCHC: 32.3 g/dL (ref 30.0–36.0)
MCV: 100.5 fL — ABNORMAL HIGH (ref 80.0–100.0)
Platelets: 190 10*3/uL (ref 150–400)
RBC: 3.94 MIL/uL — ABNORMAL LOW (ref 4.22–5.81)
RDW: 14.4 % (ref 11.5–15.5)
WBC: 6.2 10*3/uL (ref 4.0–10.5)
nRBC: 0.3 % — ABNORMAL HIGH (ref 0.0–0.2)

## 2018-10-18 LAB — PROTIME-INR
INR: 1.3 — AB (ref 0.8–1.2)
Prothrombin Time: 16.2 seconds — ABNORMAL HIGH (ref 11.4–15.2)

## 2018-10-18 MED ORDER — ASPIRIN EC 81 MG PO TBEC
81.0000 mg | DELAYED_RELEASE_TABLET | Freq: Every day | ORAL | Status: DC
  Administered 2018-10-18 – 2018-10-19 (×2): 81 mg via ORAL
  Filled 2018-10-18 (×2): qty 1

## 2018-10-18 MED ORDER — WARFARIN SODIUM 7.5 MG PO TABS
7.5000 mg | ORAL_TABLET | Freq: Once | ORAL | Status: AC
  Administered 2018-10-18: 7.5 mg via ORAL
  Filled 2018-10-18: qty 1

## 2018-10-18 MED ORDER — FUROSEMIDE 40 MG PO TABS
40.0000 mg | ORAL_TABLET | Freq: Every day | ORAL | Status: DC
  Administered 2018-10-18 – 2018-10-19 (×2): 40 mg via ORAL
  Filled 2018-10-18 (×2): qty 1

## 2018-10-18 NOTE — Progress Notes (Signed)
[place] [facility] [place] NAME: Gabriel Delacruz    MR#:  [id number]  DATE OF BIRTH:  03/31/1928  SUBJECTIVE:   No acute issues overnight.  Awaiting insurance approval for discharge planning  REVIEW OF SYSTEMS:    Review of Systems  Constitutional: Negative for fever, chills weight loss HENT: Negative for ear pain, nosebleeds, congestion, facial swelling, rhinorrhea, neck pain, neck stiffness and ear discharge.   Respiratory: Negative for cough, shortness of breath, wheezing  Cardiovascular: Negative for chest pain, palpitations and leg swelling.  Gastrointestinal: Negative for heartburn, abdominal pain, vomiting, diarrhea or consitpation Genitourinary: Negative for dysuria, urgency, frequency, hematuria Musculoskeletal: Negative for back pain or joint pain Neurological: Negative for dizziness, seizures, syncope, focal weakness,  numbness and headaches.  Hematological: Does not bruise/bleed easily.  Psychiatric/Behavioral: Negative for hallucinations, confusion, dysphoric mood    Tolerating Diet: yes      DRUG ALLERGIES:  No Known Allergies  VITALS:  Blood pressure 110/69, pulse 69, temperature 98.4 F (36.9 C), temperature source Oral, resp. rate 16, height 5\' 10"  (1.778 m), weight 77.6 kg, SpO2 98 %.  PHYSICAL EXAMINATION:  Constitutional: Appears well-developed and well-nourished. No distress. HENT: Normocephalic. Marland Kitchen Oropharynx is clear and moist.  Eyes: Conjunctivae and EOM are normal. PERRLA, no scleral icterus.  Neck: Normal ROM. Neck supple. No JVD. No tracheal deviation. CVS: RRR, S1/S2 +, no murmurs, no gallops, no carotid bruit.  Pulmonary: Effort and breath sounds normal, no stridor, rhonchi, wheezes, rales.  Abdominal: Soft. BS +,  no distension, tenderness, rebound or guarding.  Musculoskeletal: Normal range of motion. No edema and no tenderness.  Neuro: Alert. CN 2-12 grossly intact. No focal deficits. Skin: Skin is warm  and dry. No rash noted. Psychiatric: Normal mood and affect.      LABORATORY PANEL:   CBC Recent Labs  Lab 10/18/18 0356  WBC 6.2  HGB 12.8*  HCT 39.6  PLT 190   ------------------------------------------------------------------------------------------------------------------  Chemistries  Recent Labs  Lab 10/18/18 0356  NA 136  K 3.5  CL 104  CO2 24  GLUCOSE 108*  BUN 28*  CREATININE 0.83  CALCIUM 8.1*   ------------------------------------------------------------------------------------------------------------------  Cardiac Enzymes Recent Labs  Lab 10/13/18 1251  TROPONINI 0.04*   ------------------------------------------------------------------------------------------------------------------  RADIOLOGY:  No results found.   ASSESSMENT AND PLAN:    85 year old male with history of atrial fibrillation, hypertension and hypothyroidism who came in due to severe bradycardia.   1.  Severe persistent bradycardia: Patient is postoperative day #2 pacemaker placement by [facility] clinic cardiology. Patient to follow-up with Dr. Henrene Pastor shows in 1 week.  2.  Acute kidney injury in the setting of dehydration which resolved  3.  Acute on chronic diastolic heart failure: Patient is now euvolemic Continue oral Lasix 4.  History of PAF: Beta-blocker has been discontinued given severe bradycardia Coumadin restarted  5.  Elevated troponin due to demand ischemia from severe bradycardia: Patient ruled out for ACS 6  COPD without signs of exacerbation: Continue nebs. 7.  Essential hypertension: Continue losartan  Management plans discussed with the patient and son and they are  in agreement.  CODE STATUS: FULL  TOTAL TIME TAKING CARE OF THIS PATIENT: 24 minutes.     POSSIBLE D/C tomorrow, DEPENDING ON insurance  Melysa Schroyer M.D on 10/18/2018 at 12:22 PM  Between 7am to 6pm - Pager - [phone] After 6pm go to [email or web address] - password [email or web address] [facility]  Office  [phone]  CC:  Primary care physician; Marinda Elk, MD  Note: This dictation was prepared with Dragon dictation along with smaller phrase technology. Any transcriptional errors that result from this process are unintentional.

## 2018-10-18 NOTE — Clinical Social Work Note (Signed)
Clinical Social Work Assessment  Patient Details  Name: Ziyad Dyar MRN: [id number] Date of Birth: 17-Dec-1927  Date of referral:  10/18/18               Reason for consult:  Facility Placement                Permission sought to share information with:  Facility Sport and exercise psychologist, Family Supports Permission granted to share information::  Yes, Verbal Permission Granted  Name::     Sahmir, [place] Daughter [phone] or [place] [phone] or daniell, mancinas   [phone]  Agency::  SNF admissions  Relationship::     Contact Information:     Housing/Transportation Living arrangements for the past 2 months:  Single Family Home Source of Information:  Adult Children, Medical Team, Patient Patient Interpreter Needed:  None Criminal Activity/Legal Involvement Pertinent to Current Situation/Hospitalization:  No - Comment as needed Significant Relationships:  Adult Children Lives with:  Self Do you feel safe going back to the place where you live?  No Need for family participation in patient care:  Yes (Comment)  Care giving concerns: Patient and family feel that he needs some short term rehab before he is able to return back home.   Social Worker assessment / plan:  Patient is a 85 year old male who lives alone.  Patient is alert and oriented x4, patient's son [name] was at the bedside  Patient states he has not been to rehab before, but his wife was.  CSW explained to patient what to expect and process for looking for SNF placement and how insurance will pay for stay.  Patient requested either [facility] or [facility], CSW explained that [facility] is no longer taking patients.  Patient stated he would rather go home with home health, but understands that he needs SNF first before he is able to return back home.  Patient gave CSW permission to begin bed search in [place].  Employment status:  Retired Nurse, adult PT  Recommendations:  [place] / Referral to community resources:  [facility]  Patient/Family's Response to care:  Patient and family are agreeable to going to SNF for short term rehab.  Patient/Family's Understanding of and Emotional Response to Diagnosis, Current Treatment, and Prognosis:  Patient is hopeful that he does not have to be in SNF for very long.  Emotional Assessment Appearance:  Appears stated age Attitude/Demeanor/Rapport:    Affect (typically observed):  Appropriate, Stable, Accepting Orientation:  Oriented to Place, Oriented to Self, Oriented to  Time, Oriented to Situation Alcohol / Substance use:  Not Applicable Psych involvement (Current and /or in the community):  No (Comment)  Discharge Needs  Concerns to be addressed:  Lack of Support, Care Coordination Readmission within the last 30 days:  No Current discharge risk:  Lack of support system, Lives alone Barriers to Discharge:  Insurance Authorization   Cecil Cobbs 10/18/2018, 6:55 PM

## 2018-10-18 NOTE — Consult Note (Signed)
ANTICOAGULATION CONSULT NOTE - Initial Consult  Pharmacy Consult for Warfarin Dosing  Indication: atrial fibrillation  No Known Allergies  Patient Measurements: Height: 5\' 10"  (177.8 cm) Weight: 171 lb 1.2 oz (77.6 kg) IBW/kg (Calculated) : 73  Vital Signs: Temp: 98.5 F (36.9 C) (03/04 0808) Temp Source: Oral (03/04 0808) BP: 126/83 (03/04 0808) Pulse Rate: 70 (03/04 0808)  Labs: Recent Labs    10/16/18 0453 10/17/18 0622 10/18/18 0356  HGB  --   --  12.8*  HCT  --   --  39.6  PLT  --   --  190  LABPROT 24.5* 17.7* 16.2*  INR 2.2* 1.5* 1.3*  CREATININE 1.06 0.78 0.83    Estimated Creatinine Clearance: 63.5 mL/min (by C-G formula based on SCr of 0.83 mg/dL).   Medical History: Past Medical History:  Diagnosis Date  . Anemia   . Atrial fibrillation ([place])   . COPD (chronic obstructive pulmonary disease) ([place])   . GERD (gastroesophageal reflux disease)   . Glaucoma   . Hyperlipemia   . Hypertension   . Hypothyroidism   . Irregular heart rhythm   . Left anterior fascicular block   . MGUS (monoclonal gammopathy of unknown significance)   . RBBB     Assessment: Pharmacy consulted for warfarin dosing and monitoring in 85 yo male with PMH of A. Fib. Patient takes warfarin 6mg  daily and had a therapeutic INR of 2.6 on admission.  Warfarin has been held inpatient x 5 days and patient received Vitamin K 5mg  x 2 doses prior to pacemake placement on 3/2.   DATE INR DOSE 3/3 1.5 7.5mg  3/4  1.3   Goal of Therapy:  INR 2-3 Monitor platelets by anticoagulation protocol: Yes   Plan:  3/3  Will order warfarin 7.5mg  x 1 again dose tonight. Will plan to restart patient's home regimen of warfarin 6mg  daily tomorrow.  INRs ordered daily until therapeutic x2.  CBC at least every 3 days per protocol.   Pernell Dupre, PharmD, BCPS Clinical Pharmacist 10/18/2018 8:33 AM

## 2018-10-18 NOTE — Clinical Social Work Note (Signed)
CSW provided SNF choices to patient and his son, patient chose [facility].  CSW contacted [facility], they can accept patient pending insurance authorization.  [facility] is regular [facility], [facility].  CSW to continue to follow patient's progress throughout discharge planning.  Jones Broom. Norval Morton, MSW, [place]  10/18/2018 6:51 PM

## 2018-10-19 DIAGNOSIS — R2681 Unsteadiness on feet: Secondary | ICD-10-CM | POA: Diagnosis not present

## 2018-10-19 DIAGNOSIS — I482 Chronic atrial fibrillation, unspecified: Secondary | ICD-10-CM | POA: Diagnosis not present

## 2018-10-19 DIAGNOSIS — M6281 Muscle weakness (generalized): Secondary | ICD-10-CM | POA: Diagnosis not present

## 2018-10-19 DIAGNOSIS — E785 Hyperlipidemia, unspecified: Secondary | ICD-10-CM | POA: Diagnosis not present

## 2018-10-19 DIAGNOSIS — D649 Anemia, unspecified: Secondary | ICD-10-CM | POA: Diagnosis not present

## 2018-10-19 DIAGNOSIS — Z7901 Long term (current) use of anticoagulants: Secondary | ICD-10-CM | POA: Diagnosis not present

## 2018-10-19 DIAGNOSIS — H409 Unspecified glaucoma: Secondary | ICD-10-CM | POA: Diagnosis not present

## 2018-10-19 DIAGNOSIS — J449 Chronic obstructive pulmonary disease, unspecified: Secondary | ICD-10-CM | POA: Diagnosis not present

## 2018-10-19 DIAGNOSIS — Z8679 Personal history of other diseases of the circulatory system: Secondary | ICD-10-CM | POA: Diagnosis not present

## 2018-10-19 DIAGNOSIS — I10 Essential (primary) hypertension: Secondary | ICD-10-CM | POA: Diagnosis not present

## 2018-10-19 DIAGNOSIS — I4891 Unspecified atrial fibrillation: Secondary | ICD-10-CM | POA: Diagnosis not present

## 2018-10-19 DIAGNOSIS — N179 Acute kidney failure, unspecified: Secondary | ICD-10-CM | POA: Diagnosis not present

## 2018-10-19 DIAGNOSIS — R7989 Other specified abnormal findings of blood chemistry: Secondary | ICD-10-CM | POA: Diagnosis not present

## 2018-10-19 DIAGNOSIS — R001 Bradycardia, unspecified: Secondary | ICD-10-CM | POA: Diagnosis not present

## 2018-10-19 DIAGNOSIS — I2510 Atherosclerotic heart disease of native coronary artery without angina pectoris: Secondary | ICD-10-CM | POA: Diagnosis not present

## 2018-10-19 DIAGNOSIS — I5033 Acute on chronic diastolic (congestive) heart failure: Secondary | ICD-10-CM | POA: Diagnosis not present

## 2018-10-19 DIAGNOSIS — Z950 Presence of cardiac pacemaker: Secondary | ICD-10-CM | POA: Diagnosis not present

## 2018-10-19 LAB — PROTIME-INR
INR: 1.3 — ABNORMAL HIGH (ref 0.8–1.2)
Prothrombin Time: 16.2 seconds — ABNORMAL HIGH (ref 11.4–15.2)

## 2018-10-19 MED ORDER — WARFARIN SODIUM 6 MG PO TABS
6.0000 mg | ORAL_TABLET | Freq: Every day | ORAL | Status: DC
  Filled 2018-10-19: qty 1

## 2018-10-19 MED ORDER — FUROSEMIDE 40 MG PO TABS: 40.0000 mg | ORAL_TABLET | Freq: Every day | ORAL | 0 refills | Status: DC

## 2018-10-19 NOTE — Progress Notes (Signed)
Report called to [place], Therapist, sports at [facility]. Pt will be discharging there ([facility]), family will be transporting him to the facility.

## 2018-10-19 NOTE — Clinical Social Work Note (Signed)
CSW received phone call from [facility], they have received insurance authorization and can accept patient today.  CSW updated patient and his family, they were relieved that he was approved.  Patient to be d/c'ed today to [facility] room 712.  Patient and family agreeable to plans will transport via son's car RN to call report [phone].  Patient's son was at bedside and is aware that patient will be discharging today.  Evette Cristal, MSW, LCSW [phone]

## 2018-10-19 NOTE — Progress Notes (Signed)
Removed tele, IV, assisted pt with getting dressed and into wheelchair. Pt wheeled down to son's vehicle by Dr. Tami Ribas. Pt's son taking him to [facility].

## 2018-10-19 NOTE — Care Management Important Message (Signed)
Copy of signed Medicare IM left with patient in room. 

## 2018-10-19 NOTE — Discharge Summary (Signed)
[place] at [place] NAME: Gabriel Delacruz    MR#:  [id number]  DATE OF BIRTH:  12-10-1927  DATE OF ADMISSION:  10/13/2018 ADMITTING PHYSICIAN: Demetrios Loll, MD  DATE OF DISCHARGE: 10/19/2018  PRIMARY CARE PHYSICIAN: Marinda Elk, MD    ADMISSION DIAGNOSIS:  Bradycardia  DISCHARGE DIAGNOSIS:  Active Problems:   Severe sinus bradycardia   SECONDARY DIAGNOSIS:   Past Medical History:  Diagnosis Date  . Anemia   . Atrial fibrillation ([place])   . COPD (chronic obstructive pulmonary disease) ([place])   . GERD (gastroesophageal reflux disease)   . Glaucoma   . Hyperlipemia   . Hypertension   . Hypothyroidism   . Irregular heart rhythm   . Left anterior fascicular block   . MGUS (monoclonal gammopathy of unknown significance)   . RBBB     HOSPITAL COURSE:   85 year old male with history of atrial fibrillation, hypertension and hypothyroidism who came in due to severe bradycardia.   1.  Severe persistent bradycardia: Patient is postoperative day #3  pacemaker placement by [facility] clinic cardiology. Patient to follow-up with Dr. Delila Spence in 1 week.  2.  Acute kidney injury in the setting of dehydration which hasresolved  3.  Acute on chronic diastolic heart failure: Patient is now euvolemic Continue oral Lasix He has been referred to CHF clinic upon discharge.   4.  History of PAF: Beta-blocker has been discontinued given severe bradycardia Coumadin is been restarted Full INR 2-3.  INR should be checked tomorrow.  Coumadin can be adjusted accordingly.   5.  Elevated troponin due to demand ischemia from severe bradycardia: Patient ruled out for ACS 6  COPD without signs of exacerbation: Continue nebs. 7.  Essential hypertension: Continue losartan   DISCHARGE CONDITIONS AND DIET:   Stable for discharge on heart healthy diet  CONSULTS OBTAINED:  Treatment Team:  Isaias Cowman, MD  DRUG ALLERGIES:  No Known  Allergies  DISCHARGE MEDICATIONS:   Allergies as of 10/19/2018   No Known Allergies     Medication List    STOP taking these medications   amLODipine 5 MG tablet Commonly known as:  NORVASC   nebivolol 10 MG tablet Commonly known as:  BYSTOLIC     TAKE these medications   acetaminophen 325 MG tablet Commonly known as:  TYLENOL Take 650 mg by mouth every 6 (six) hours as needed.   ADVAIR DISKUS 250-50 MCG/DOSE Aepb Generic drug:  Fluticasone-Salmeterol Inhale 1 puff into the lungs 2 (two) times daily.   aspirin EC 81 MG tablet Take 81 mg by mouth daily.   dorzolamide-timolol 22.3-6.8 MG/ML ophthalmic solution Commonly known as:  COSOPT 1 drop Two (2) times a day as needed   furosemide 40 MG tablet Commonly known as:  LASIX Take 1 tablet (40 mg total) by mouth daily. What changed:    medication strength  how much to take   losartan-hydrochlorothiazide 100-12.5 MG tablet Commonly known as:  HYZAAR Take 1 tablet by mouth daily.   multivitamin-iron-minerals-folic acid chewable tablet Chew 1 tablet by mouth daily.   potassium chloride 10 MEQ tablet Commonly known as:  K-DUR Take 10 mEq by mouth daily.   PROAIR HFA 108 (90 Base) MCG/ACT inhaler Generic drug:  albuterol Inhale 2 puffs into the lungs every 6 (six) hours as needed.   TRAVATAN Z 0.004 % Soln ophthalmic solution Generic drug:  Travoprost (BAK Free) PLACE 1 DROP INTO BOTH EYES ONCE DAILY   warfarin  6 MG tablet Commonly known as:  COUMADIN TAKE 1 TABLET EVERY DAY         Today   CHIEF COMPLAINT:  No acute events overnight.  Patient without shortness of breath or chest pain   VITAL SIGNS:  Blood pressure 134/77, pulse 71, temperature 98.5 F (36.9 C), temperature source Oral, resp. rate 16, height 5\' 10"  (1.778 m), weight 80.3 kg, SpO2 99 %.   REVIEW OF SYSTEMS:  Review of Systems  Constitutional: Negative.  Negative for chills, fever and malaise/fatigue.  HENT: Negative.  Negative  for ear discharge, ear pain, hearing loss, nosebleeds and sore throat.   Eyes: Negative.  Negative for blurred vision and pain.  Respiratory: Negative.  Negative for cough, hemoptysis, shortness of breath and wheezing.   Cardiovascular: Negative.  Negative for chest pain, palpitations and leg swelling.  Gastrointestinal: Negative.  Negative for abdominal pain, blood in stool, diarrhea, nausea and vomiting.  Genitourinary: Negative.  Negative for dysuria.  Musculoskeletal: Negative.  Negative for back pain.  Skin: Negative.   Neurological: Negative for dizziness, tremors, speech change, focal weakness, seizures and headaches.  Endo/Heme/Allergies: Negative.  Does not bruise/bleed easily.  Psychiatric/Behavioral: Negative.  Negative for depression, hallucinations and suicidal ideas.     PHYSICAL EXAMINATION:  GENERAL:  85 y.o.-year-old patient lying in the bed with no acute distress.  NECK:  Supple, no jugular venous distention. No thyroid enlargement, no tenderness.  LUNGS: Normal breath sounds bilaterally, no wheezing, rales,rhonchi  No use of accessory muscles of respiration.  CARDIOVASCULAR: S1, S2 normal. No murmurs, rubs, or gallops.  ABDOMEN: Soft, non-tender, non-distended. Bowel sounds present. No organomegaly or mass.  EXTREMITIES: No pedal edema, cyanosis, or clubbing.  PSYCHIATRIC: The patient is alert and oriented x 3.  SKIN: No obvious rash, lesion, or ulcer.   DATA REVIEW:   CBC Recent Labs  Lab 10/18/18 0356  WBC 6.2  HGB 12.8*  HCT 39.6  PLT 190    Chemistries  Recent Labs  Lab 10/18/18 0356  NA 136  K 3.5  CL 104  CO2 24  GLUCOSE 108*  BUN 28*  CREATININE 0.83  CALCIUM 8.1*    Cardiac Enzymes Recent Labs  Lab 10/13/18 1251  TROPONINI 0.04*    Microbiology Results  @MICRORSLT48 @  RADIOLOGY:  No results found.    Allergies as of 10/19/2018   No Known Allergies     Medication List    STOP taking these medications   amLODipine 5 MG  tablet Commonly known as:  NORVASC   nebivolol 10 MG tablet Commonly known as:  BYSTOLIC     TAKE these medications   acetaminophen 325 MG tablet Commonly known as:  TYLENOL Take 650 mg by mouth every 6 (six) hours as needed.   ADVAIR DISKUS 250-50 MCG/DOSE Aepb Generic drug:  Fluticasone-Salmeterol Inhale 1 puff into the lungs 2 (two) times daily.   aspirin EC 81 MG tablet Take 81 mg by mouth daily.   dorzolamide-timolol 22.3-6.8 MG/ML ophthalmic solution Commonly known as:  COSOPT 1 drop Two (2) times a day as needed   furosemide 40 MG tablet Commonly known as:  LASIX Take 1 tablet (40 mg total) by mouth daily. What changed:    medication strength  how much to take   losartan-hydrochlorothiazide 100-12.5 MG tablet Commonly known as:  HYZAAR Take 1 tablet by mouth daily.   multivitamin-iron-minerals-folic acid chewable tablet Chew 1 tablet by mouth daily.   potassium chloride 10 MEQ tablet Commonly known as:  K-DUR Take 10 mEq by mouth daily.   PROAIR HFA 108 (90 Base) MCG/ACT inhaler Generic drug:  albuterol Inhale 2 puffs into the lungs every 6 (six) hours as needed.   TRAVATAN Z 0.004 % Soln ophthalmic solution Generic drug:  Travoprost (BAK Free) PLACE 1 DROP INTO BOTH EYES ONCE DAILY   warfarin 6 MG tablet Commonly known as:  COUMADIN TAKE 1 TABLET EVERY DAY          Management plans discussed with the patient and he is in agreement. Stable for discharge   Patient should follow up with cardiology  CODE STATUS:     Code Status Orders  (From admission, onward)         Start     Ordered   10/13/18 2303  Full code  Continuous     10/13/18 2302        Code Status History    This patient has a current code status but no historical code status.    Advance Directive Documentation     Most Recent Value  Type of Advance Directive  Living will  Pre-existing out of facility DNR order (yellow form or pink MOST form)  -  "MOST" Form in  Place?  -      TOTAL TIME TAKING CARE OF THIS PATIENT: 38 minutes.    Note: This dictation was prepared with Dragon dictation along with smaller phrase technology. Any transcriptional errors that result from this process are unintentional.  Keneshia Tena M.D on 10/19/2018 at 10:19 AM  Between 7am to 6pm - Pager - [phone] After 6pm go to [email or web address] - password [email or web address] [facility]  Office  [phone]  CC: Primary care physician; Marinda Elk, MD

## 2018-10-19 NOTE — Consult Note (Signed)
ANTICOAGULATION CONSULT NOTE - Initial Consult  Pharmacy Consult for Warfarin Dosing  Indication: atrial fibrillation  No Known Allergies  Patient Measurements: Height: 5\' 10"  (177.8 cm) Weight: 177 lb 0.5 oz (80.3 kg) IBW/kg (Calculated) : 73  Vital Signs: Temp: 97.9 F (36.6 C) (03/05 0524) Temp Source: Oral (03/05 0524) BP: 139/82 (03/05 0524) Pulse Rate: 70 (03/05 0524)  Labs: Recent Labs    10/17/18 0622 10/18/18 0356 10/19/18 0559  HGB  --  12.8*  --   HCT  --  39.6  --   PLT  --  190  --   LABPROT 17.7* 16.2* 16.2*  INR 1.5* 1.3* 1.3*  CREATININE 0.78 0.83  --     Estimated Creatinine Clearance: 63.5 mL/min (by C-G formula based on SCr of 0.83 mg/dL).   Medical History: Past Medical History:  Diagnosis Date  . Anemia   . Atrial fibrillation ([place])   . COPD (chronic obstructive pulmonary disease) ([place])   . GERD (gastroesophageal reflux disease)   . Glaucoma   . Hyperlipemia   . Hypertension   . Hypothyroidism   . Irregular heart rhythm   . Left anterior fascicular block   . MGUS (monoclonal gammopathy of unknown significance)   . RBBB     Assessment: Pharmacy consulted for warfarin dosing and monitoring in 85 yo male with PMH of A. Fib. Patient takes warfarin 6mg  daily and had a therapeutic INR of 2.6 on admission.  Warfarin has been held inpatient x 5 days and patient received Vitamin K 5mg  x 2 doses prior to pacemake placement on 3/2.   DATE INR DOSE 3/3 1.5 7.5mg  3/4  1.3 7.5 mg  3/5 1.3  Goal of Therapy:  INR 2-3 Monitor platelets by anticoagulation protocol: Yes   Plan:  3/5 Will resume patient's home dose of warfarin 6mg  tonight.  Expect to see INR start to trend up tomorrow.  INRs ordered daily until therapeutic x2.  CBC at least every 3 days per protocol.   Pernell Dupre, PharmD, BCPS Clinical Pharmacist 10/19/2018 6:57 AM

## 2018-10-22 DIAGNOSIS — Z950 Presence of cardiac pacemaker: Secondary | ICD-10-CM | POA: Diagnosis not present

## 2018-10-22 DIAGNOSIS — I4891 Unspecified atrial fibrillation: Secondary | ICD-10-CM | POA: Diagnosis not present

## 2018-10-22 DIAGNOSIS — R001 Bradycardia, unspecified: Secondary | ICD-10-CM | POA: Diagnosis not present

## 2018-10-22 DIAGNOSIS — J449 Chronic obstructive pulmonary disease, unspecified: Secondary | ICD-10-CM | POA: Diagnosis not present

## 2018-10-22 DIAGNOSIS — I10 Essential (primary) hypertension: Secondary | ICD-10-CM | POA: Diagnosis not present

## 2018-10-22 DIAGNOSIS — H409 Unspecified glaucoma: Secondary | ICD-10-CM | POA: Diagnosis not present

## 2018-10-27 DIAGNOSIS — E785 Hyperlipidemia, unspecified: Secondary | ICD-10-CM | POA: Diagnosis not present

## 2018-10-27 DIAGNOSIS — J449 Chronic obstructive pulmonary disease, unspecified: Secondary | ICD-10-CM | POA: Diagnosis not present

## 2018-10-27 DIAGNOSIS — I10 Essential (primary) hypertension: Secondary | ICD-10-CM | POA: Diagnosis not present

## 2018-10-27 DIAGNOSIS — I4891 Unspecified atrial fibrillation: Secondary | ICD-10-CM | POA: Diagnosis not present

## 2018-10-27 DIAGNOSIS — R001 Bradycardia, unspecified: Secondary | ICD-10-CM | POA: Diagnosis not present

## 2018-10-27 DIAGNOSIS — H409 Unspecified glaucoma: Secondary | ICD-10-CM | POA: Diagnosis not present

## 2018-10-27 DIAGNOSIS — Z950 Presence of cardiac pacemaker: Secondary | ICD-10-CM | POA: Diagnosis not present

## 2018-10-27 DIAGNOSIS — Z7901 Long term (current) use of anticoagulants: Secondary | ICD-10-CM | POA: Diagnosis not present

## 2018-11-01 DIAGNOSIS — Z7901 Long term (current) use of anticoagulants: Secondary | ICD-10-CM | POA: Diagnosis not present

## 2018-11-03 DIAGNOSIS — I4891 Unspecified atrial fibrillation: Secondary | ICD-10-CM | POA: Diagnosis not present

## 2018-11-03 DIAGNOSIS — I5033 Acute on chronic diastolic (congestive) heart failure: Secondary | ICD-10-CM | POA: Diagnosis not present

## 2018-11-03 DIAGNOSIS — Z48812 Encounter for surgical aftercare following surgery on the circulatory system: Secondary | ICD-10-CM | POA: Diagnosis not present

## 2018-11-03 DIAGNOSIS — I110 Hypertensive heart disease with heart failure: Secondary | ICD-10-CM | POA: Diagnosis not present

## 2018-11-03 DIAGNOSIS — J449 Chronic obstructive pulmonary disease, unspecified: Secondary | ICD-10-CM | POA: Diagnosis not present

## 2018-11-03 DIAGNOSIS — Z7901 Long term (current) use of anticoagulants: Secondary | ICD-10-CM | POA: Diagnosis not present

## 2018-11-03 DIAGNOSIS — Z950 Presence of cardiac pacemaker: Secondary | ICD-10-CM | POA: Diagnosis not present

## 2018-11-03 DIAGNOSIS — Z7982 Long term (current) use of aspirin: Secondary | ICD-10-CM | POA: Diagnosis not present

## 2018-11-06 DIAGNOSIS — R001 Bradycardia, unspecified: Secondary | ICD-10-CM | POA: Diagnosis not present

## 2018-11-06 DIAGNOSIS — I10 Essential (primary) hypertension: Secondary | ICD-10-CM | POA: Diagnosis not present

## 2018-11-06 DIAGNOSIS — J449 Chronic obstructive pulmonary disease, unspecified: Secondary | ICD-10-CM | POA: Diagnosis not present

## 2018-11-06 DIAGNOSIS — I4891 Unspecified atrial fibrillation: Secondary | ICD-10-CM | POA: Diagnosis not present

## 2018-11-07 ENCOUNTER — Ambulatory Visit: Payer: Self-pay | Admitting: Family

## 2018-11-07 DIAGNOSIS — I4891 Unspecified atrial fibrillation: Secondary | ICD-10-CM | POA: Diagnosis not present

## 2018-11-07 DIAGNOSIS — Z48812 Encounter for surgical aftercare following surgery on the circulatory system: Secondary | ICD-10-CM | POA: Diagnosis not present

## 2018-11-07 DIAGNOSIS — Z950 Presence of cardiac pacemaker: Secondary | ICD-10-CM | POA: Diagnosis not present

## 2018-11-07 DIAGNOSIS — Z7982 Long term (current) use of aspirin: Secondary | ICD-10-CM | POA: Diagnosis not present

## 2018-11-07 DIAGNOSIS — I5033 Acute on chronic diastolic (congestive) heart failure: Secondary | ICD-10-CM | POA: Diagnosis not present

## 2018-11-07 DIAGNOSIS — I110 Hypertensive heart disease with heart failure: Secondary | ICD-10-CM | POA: Diagnosis not present

## 2018-11-07 DIAGNOSIS — J449 Chronic obstructive pulmonary disease, unspecified: Secondary | ICD-10-CM | POA: Diagnosis not present

## 2018-11-07 DIAGNOSIS — Z7901 Long term (current) use of anticoagulants: Secondary | ICD-10-CM | POA: Diagnosis not present

## 2018-11-07 NOTE — Progress Notes (Deleted)
   Patient ID: Gabriel Delacruz, male    DOB: Mar 06, 1928, 85 y.o.   MRN: [id number]  HPI  Gabriel Delacruz is a 85 y/o male with a history of  Echo report from 10/14/2018 reviewed and showed an EF of 60-65% along with severe TR and mild/moderate AR.   Admitted 10/13/2018 due to severe bradycardia. Cardiology consult obtained. Pacemaker placed 10/16/2018. Had acute kidney injury due to dehydration which resolved. Elevated troponin thought to be due to demand ischemia. Discharged after 6 days.  He presents today for his initial visit with a chief complaint of    Review of Systems    Physical Exam    Assessment & Plan:  1: Chronic heart failure with preserved ejection fraction- - NYHA class - BNP 10/15/2018 was 1429.0  2: HTN- - BP - BMP from 10/18/2018 reviewed and showed sodium 136, potassium 3.5, creatinine 0.83 and GFR >60  3: Atrial fibrillation-

## 2018-11-08 ENCOUNTER — Telehealth: Payer: Self-pay | Admitting: Family

## 2018-11-08 DIAGNOSIS — I4891 Unspecified atrial fibrillation: Secondary | ICD-10-CM | POA: Diagnosis not present

## 2018-11-08 DIAGNOSIS — J449 Chronic obstructive pulmonary disease, unspecified: Secondary | ICD-10-CM | POA: Diagnosis not present

## 2018-11-08 DIAGNOSIS — Z7982 Long term (current) use of aspirin: Secondary | ICD-10-CM | POA: Diagnosis not present

## 2018-11-08 DIAGNOSIS — I110 Hypertensive heart disease with heart failure: Secondary | ICD-10-CM | POA: Diagnosis not present

## 2018-11-08 DIAGNOSIS — Z950 Presence of cardiac pacemaker: Secondary | ICD-10-CM | POA: Diagnosis not present

## 2018-11-08 DIAGNOSIS — I5033 Acute on chronic diastolic (congestive) heart failure: Secondary | ICD-10-CM | POA: Diagnosis not present

## 2018-11-08 DIAGNOSIS — Z7901 Long term (current) use of anticoagulants: Secondary | ICD-10-CM | POA: Diagnosis not present

## 2018-11-08 DIAGNOSIS — Z48812 Encounter for surgical aftercare following surgery on the circulatory system: Secondary | ICD-10-CM | POA: Diagnosis not present

## 2018-11-08 NOTE — Telephone Encounter (Signed)
Patient did not show for his Heart Failure Clinic appointment on 11/07/2018. Will attempt to reschedule.

## 2018-11-09 DIAGNOSIS — Z48812 Encounter for surgical aftercare following surgery on the circulatory system: Secondary | ICD-10-CM | POA: Diagnosis not present

## 2018-11-09 DIAGNOSIS — I110 Hypertensive heart disease with heart failure: Secondary | ICD-10-CM | POA: Diagnosis not present

## 2018-11-09 DIAGNOSIS — I5033 Acute on chronic diastolic (congestive) heart failure: Secondary | ICD-10-CM | POA: Diagnosis not present

## 2018-11-09 DIAGNOSIS — Z7982 Long term (current) use of aspirin: Secondary | ICD-10-CM | POA: Diagnosis not present

## 2018-11-09 DIAGNOSIS — Z7901 Long term (current) use of anticoagulants: Secondary | ICD-10-CM | POA: Diagnosis not present

## 2018-11-09 DIAGNOSIS — Z950 Presence of cardiac pacemaker: Secondary | ICD-10-CM | POA: Diagnosis not present

## 2018-11-09 DIAGNOSIS — J449 Chronic obstructive pulmonary disease, unspecified: Secondary | ICD-10-CM | POA: Diagnosis not present

## 2018-11-09 DIAGNOSIS — I4891 Unspecified atrial fibrillation: Secondary | ICD-10-CM | POA: Diagnosis not present

## 2018-11-10 DIAGNOSIS — I4891 Unspecified atrial fibrillation: Secondary | ICD-10-CM | POA: Diagnosis not present

## 2018-11-10 DIAGNOSIS — I110 Hypertensive heart disease with heart failure: Secondary | ICD-10-CM | POA: Diagnosis not present

## 2018-11-10 DIAGNOSIS — Z7982 Long term (current) use of aspirin: Secondary | ICD-10-CM | POA: Diagnosis not present

## 2018-11-10 DIAGNOSIS — Z7901 Long term (current) use of anticoagulants: Secondary | ICD-10-CM | POA: Diagnosis not present

## 2018-11-10 DIAGNOSIS — Z950 Presence of cardiac pacemaker: Secondary | ICD-10-CM | POA: Diagnosis not present

## 2018-11-10 DIAGNOSIS — I5033 Acute on chronic diastolic (congestive) heart failure: Secondary | ICD-10-CM | POA: Diagnosis not present

## 2018-11-10 DIAGNOSIS — Z48812 Encounter for surgical aftercare following surgery on the circulatory system: Secondary | ICD-10-CM | POA: Diagnosis not present

## 2018-11-10 DIAGNOSIS — J449 Chronic obstructive pulmonary disease, unspecified: Secondary | ICD-10-CM | POA: Diagnosis not present

## 2018-11-13 DIAGNOSIS — I4891 Unspecified atrial fibrillation: Secondary | ICD-10-CM | POA: Diagnosis not present

## 2018-11-13 DIAGNOSIS — I110 Hypertensive heart disease with heart failure: Secondary | ICD-10-CM | POA: Diagnosis not present

## 2018-11-13 DIAGNOSIS — J449 Chronic obstructive pulmonary disease, unspecified: Secondary | ICD-10-CM | POA: Diagnosis not present

## 2018-11-13 DIAGNOSIS — I5033 Acute on chronic diastolic (congestive) heart failure: Secondary | ICD-10-CM | POA: Diagnosis not present

## 2018-11-13 DIAGNOSIS — R001 Bradycardia, unspecified: Secondary | ICD-10-CM | POA: Diagnosis not present

## 2018-11-13 DIAGNOSIS — Z7901 Long term (current) use of anticoagulants: Secondary | ICD-10-CM | POA: Diagnosis not present

## 2018-11-13 DIAGNOSIS — Z48812 Encounter for surgical aftercare following surgery on the circulatory system: Secondary | ICD-10-CM | POA: Diagnosis not present

## 2018-11-13 DIAGNOSIS — Z7982 Long term (current) use of aspirin: Secondary | ICD-10-CM | POA: Diagnosis not present

## 2018-11-13 DIAGNOSIS — I10 Essential (primary) hypertension: Secondary | ICD-10-CM | POA: Diagnosis not present

## 2018-11-13 DIAGNOSIS — Z950 Presence of cardiac pacemaker: Secondary | ICD-10-CM | POA: Diagnosis not present

## 2018-11-14 DIAGNOSIS — Z950 Presence of cardiac pacemaker: Secondary | ICD-10-CM | POA: Diagnosis not present

## 2018-11-14 DIAGNOSIS — J449 Chronic obstructive pulmonary disease, unspecified: Secondary | ICD-10-CM | POA: Diagnosis not present

## 2018-11-14 DIAGNOSIS — Z48812 Encounter for surgical aftercare following surgery on the circulatory system: Secondary | ICD-10-CM | POA: Diagnosis not present

## 2018-11-14 DIAGNOSIS — I5033 Acute on chronic diastolic (congestive) heart failure: Secondary | ICD-10-CM | POA: Diagnosis not present

## 2018-11-14 DIAGNOSIS — I110 Hypertensive heart disease with heart failure: Secondary | ICD-10-CM | POA: Diagnosis not present

## 2018-11-14 DIAGNOSIS — Z7982 Long term (current) use of aspirin: Secondary | ICD-10-CM | POA: Diagnosis not present

## 2018-11-14 DIAGNOSIS — Z7901 Long term (current) use of anticoagulants: Secondary | ICD-10-CM | POA: Diagnosis not present

## 2018-11-14 DIAGNOSIS — I4891 Unspecified atrial fibrillation: Secondary | ICD-10-CM | POA: Diagnosis not present

## 2018-11-15 DIAGNOSIS — I4891 Unspecified atrial fibrillation: Secondary | ICD-10-CM | POA: Diagnosis not present

## 2018-11-15 DIAGNOSIS — Z48812 Encounter for surgical aftercare following surgery on the circulatory system: Secondary | ICD-10-CM | POA: Diagnosis not present

## 2018-11-15 DIAGNOSIS — Z7982 Long term (current) use of aspirin: Secondary | ICD-10-CM | POA: Diagnosis not present

## 2018-11-15 DIAGNOSIS — I110 Hypertensive heart disease with heart failure: Secondary | ICD-10-CM | POA: Diagnosis not present

## 2018-11-15 DIAGNOSIS — I5033 Acute on chronic diastolic (congestive) heart failure: Secondary | ICD-10-CM | POA: Diagnosis not present

## 2018-11-15 DIAGNOSIS — J449 Chronic obstructive pulmonary disease, unspecified: Secondary | ICD-10-CM | POA: Diagnosis not present

## 2018-11-15 DIAGNOSIS — Z7901 Long term (current) use of anticoagulants: Secondary | ICD-10-CM | POA: Diagnosis not present

## 2018-11-15 DIAGNOSIS — Z950 Presence of cardiac pacemaker: Secondary | ICD-10-CM | POA: Diagnosis not present

## 2018-11-16 DIAGNOSIS — Z950 Presence of cardiac pacemaker: Secondary | ICD-10-CM | POA: Diagnosis not present

## 2018-11-16 DIAGNOSIS — I110 Hypertensive heart disease with heart failure: Secondary | ICD-10-CM | POA: Diagnosis not present

## 2018-11-16 DIAGNOSIS — I4891 Unspecified atrial fibrillation: Secondary | ICD-10-CM | POA: Diagnosis not present

## 2018-11-16 DIAGNOSIS — Z7982 Long term (current) use of aspirin: Secondary | ICD-10-CM | POA: Diagnosis not present

## 2018-11-16 DIAGNOSIS — I5033 Acute on chronic diastolic (congestive) heart failure: Secondary | ICD-10-CM | POA: Diagnosis not present

## 2018-11-16 DIAGNOSIS — Z7901 Long term (current) use of anticoagulants: Secondary | ICD-10-CM | POA: Diagnosis not present

## 2018-11-16 DIAGNOSIS — Z48812 Encounter for surgical aftercare following surgery on the circulatory system: Secondary | ICD-10-CM | POA: Diagnosis not present

## 2018-11-16 DIAGNOSIS — J449 Chronic obstructive pulmonary disease, unspecified: Secondary | ICD-10-CM | POA: Diagnosis not present

## 2018-11-17 DIAGNOSIS — J449 Chronic obstructive pulmonary disease, unspecified: Secondary | ICD-10-CM | POA: Diagnosis not present

## 2018-11-17 DIAGNOSIS — Z7901 Long term (current) use of anticoagulants: Secondary | ICD-10-CM | POA: Diagnosis not present

## 2018-11-17 DIAGNOSIS — Z7982 Long term (current) use of aspirin: Secondary | ICD-10-CM | POA: Diagnosis not present

## 2018-11-17 DIAGNOSIS — I5033 Acute on chronic diastolic (congestive) heart failure: Secondary | ICD-10-CM | POA: Diagnosis not present

## 2018-11-17 DIAGNOSIS — I4891 Unspecified atrial fibrillation: Secondary | ICD-10-CM | POA: Diagnosis not present

## 2018-11-17 DIAGNOSIS — I110 Hypertensive heart disease with heart failure: Secondary | ICD-10-CM | POA: Diagnosis not present

## 2018-11-17 DIAGNOSIS — Z950 Presence of cardiac pacemaker: Secondary | ICD-10-CM | POA: Diagnosis not present

## 2018-11-17 DIAGNOSIS — Z48812 Encounter for surgical aftercare following surgery on the circulatory system: Secondary | ICD-10-CM | POA: Diagnosis not present

## 2018-11-20 DIAGNOSIS — Z950 Presence of cardiac pacemaker: Secondary | ICD-10-CM | POA: Diagnosis not present

## 2018-11-20 DIAGNOSIS — I5033 Acute on chronic diastolic (congestive) heart failure: Secondary | ICD-10-CM | POA: Diagnosis not present

## 2018-11-20 DIAGNOSIS — Z48812 Encounter for surgical aftercare following surgery on the circulatory system: Secondary | ICD-10-CM | POA: Diagnosis not present

## 2018-11-20 DIAGNOSIS — Z7982 Long term (current) use of aspirin: Secondary | ICD-10-CM | POA: Diagnosis not present

## 2018-11-20 DIAGNOSIS — Z7901 Long term (current) use of anticoagulants: Secondary | ICD-10-CM | POA: Diagnosis not present

## 2018-11-20 DIAGNOSIS — J449 Chronic obstructive pulmonary disease, unspecified: Secondary | ICD-10-CM | POA: Diagnosis not present

## 2018-11-20 DIAGNOSIS — I4891 Unspecified atrial fibrillation: Secondary | ICD-10-CM | POA: Diagnosis not present

## 2018-11-20 DIAGNOSIS — I110 Hypertensive heart disease with heart failure: Secondary | ICD-10-CM | POA: Diagnosis not present

## 2018-11-21 DIAGNOSIS — Z7901 Long term (current) use of anticoagulants: Secondary | ICD-10-CM | POA: Diagnosis not present

## 2018-11-21 DIAGNOSIS — I4891 Unspecified atrial fibrillation: Secondary | ICD-10-CM | POA: Diagnosis not present

## 2018-11-21 DIAGNOSIS — J449 Chronic obstructive pulmonary disease, unspecified: Secondary | ICD-10-CM | POA: Diagnosis not present

## 2018-11-21 DIAGNOSIS — I5033 Acute on chronic diastolic (congestive) heart failure: Secondary | ICD-10-CM | POA: Diagnosis not present

## 2018-11-21 DIAGNOSIS — Z48812 Encounter for surgical aftercare following surgery on the circulatory system: Secondary | ICD-10-CM | POA: Diagnosis not present

## 2018-11-21 DIAGNOSIS — Z950 Presence of cardiac pacemaker: Secondary | ICD-10-CM | POA: Diagnosis not present

## 2018-11-21 DIAGNOSIS — Z7982 Long term (current) use of aspirin: Secondary | ICD-10-CM | POA: Diagnosis not present

## 2018-11-21 DIAGNOSIS — I110 Hypertensive heart disease with heart failure: Secondary | ICD-10-CM | POA: Diagnosis not present

## 2018-11-22 DIAGNOSIS — Z7982 Long term (current) use of aspirin: Secondary | ICD-10-CM | POA: Diagnosis not present

## 2018-11-22 DIAGNOSIS — I5033 Acute on chronic diastolic (congestive) heart failure: Secondary | ICD-10-CM | POA: Diagnosis not present

## 2018-11-22 DIAGNOSIS — Z7901 Long term (current) use of anticoagulants: Secondary | ICD-10-CM | POA: Diagnosis not present

## 2018-11-22 DIAGNOSIS — Z950 Presence of cardiac pacemaker: Secondary | ICD-10-CM | POA: Diagnosis not present

## 2018-11-22 DIAGNOSIS — I4891 Unspecified atrial fibrillation: Secondary | ICD-10-CM | POA: Diagnosis not present

## 2018-11-22 DIAGNOSIS — I110 Hypertensive heart disease with heart failure: Secondary | ICD-10-CM | POA: Diagnosis not present

## 2018-11-22 DIAGNOSIS — J449 Chronic obstructive pulmonary disease, unspecified: Secondary | ICD-10-CM | POA: Diagnosis not present

## 2018-11-22 DIAGNOSIS — Z48812 Encounter for surgical aftercare following surgery on the circulatory system: Secondary | ICD-10-CM | POA: Diagnosis not present

## 2018-11-23 DIAGNOSIS — Z950 Presence of cardiac pacemaker: Secondary | ICD-10-CM | POA: Diagnosis not present

## 2018-11-23 DIAGNOSIS — Z48812 Encounter for surgical aftercare following surgery on the circulatory system: Secondary | ICD-10-CM | POA: Diagnosis not present

## 2018-11-23 DIAGNOSIS — J449 Chronic obstructive pulmonary disease, unspecified: Secondary | ICD-10-CM | POA: Diagnosis not present

## 2018-11-23 DIAGNOSIS — I4891 Unspecified atrial fibrillation: Secondary | ICD-10-CM | POA: Diagnosis not present

## 2018-11-23 DIAGNOSIS — Z7901 Long term (current) use of anticoagulants: Secondary | ICD-10-CM | POA: Diagnosis not present

## 2018-11-23 DIAGNOSIS — Z7982 Long term (current) use of aspirin: Secondary | ICD-10-CM | POA: Diagnosis not present

## 2018-11-23 DIAGNOSIS — I110 Hypertensive heart disease with heart failure: Secondary | ICD-10-CM | POA: Diagnosis not present

## 2018-11-23 DIAGNOSIS — I5033 Acute on chronic diastolic (congestive) heart failure: Secondary | ICD-10-CM | POA: Diagnosis not present

## 2018-11-27 DIAGNOSIS — Z7901 Long term (current) use of anticoagulants: Secondary | ICD-10-CM | POA: Diagnosis not present

## 2018-11-27 DIAGNOSIS — J449 Chronic obstructive pulmonary disease, unspecified: Secondary | ICD-10-CM | POA: Diagnosis not present

## 2018-11-27 DIAGNOSIS — Z950 Presence of cardiac pacemaker: Secondary | ICD-10-CM | POA: Diagnosis not present

## 2018-11-27 DIAGNOSIS — Z48812 Encounter for surgical aftercare following surgery on the circulatory system: Secondary | ICD-10-CM | POA: Diagnosis not present

## 2018-11-27 DIAGNOSIS — I5033 Acute on chronic diastolic (congestive) heart failure: Secondary | ICD-10-CM | POA: Diagnosis not present

## 2018-11-27 DIAGNOSIS — I110 Hypertensive heart disease with heart failure: Secondary | ICD-10-CM | POA: Diagnosis not present

## 2018-11-27 DIAGNOSIS — Z7982 Long term (current) use of aspirin: Secondary | ICD-10-CM | POA: Diagnosis not present

## 2018-11-27 DIAGNOSIS — I4891 Unspecified atrial fibrillation: Secondary | ICD-10-CM | POA: Diagnosis not present

## 2018-11-29 DIAGNOSIS — I5033 Acute on chronic diastolic (congestive) heart failure: Secondary | ICD-10-CM | POA: Diagnosis not present

## 2018-11-29 DIAGNOSIS — J449 Chronic obstructive pulmonary disease, unspecified: Secondary | ICD-10-CM | POA: Diagnosis not present

## 2018-11-29 DIAGNOSIS — Z950 Presence of cardiac pacemaker: Secondary | ICD-10-CM | POA: Diagnosis not present

## 2018-11-29 DIAGNOSIS — Z48812 Encounter for surgical aftercare following surgery on the circulatory system: Secondary | ICD-10-CM | POA: Diagnosis not present

## 2018-11-29 DIAGNOSIS — I110 Hypertensive heart disease with heart failure: Secondary | ICD-10-CM | POA: Diagnosis not present

## 2018-11-29 DIAGNOSIS — Z7901 Long term (current) use of anticoagulants: Secondary | ICD-10-CM | POA: Diagnosis not present

## 2018-11-29 DIAGNOSIS — Z7982 Long term (current) use of aspirin: Secondary | ICD-10-CM | POA: Diagnosis not present

## 2018-11-29 DIAGNOSIS — I4891 Unspecified atrial fibrillation: Secondary | ICD-10-CM | POA: Diagnosis not present

## 2018-12-04 DIAGNOSIS — I4891 Unspecified atrial fibrillation: Secondary | ICD-10-CM | POA: Diagnosis not present

## 2018-12-04 DIAGNOSIS — I5033 Acute on chronic diastolic (congestive) heart failure: Secondary | ICD-10-CM | POA: Diagnosis not present

## 2018-12-04 DIAGNOSIS — Z7901 Long term (current) use of anticoagulants: Secondary | ICD-10-CM | POA: Diagnosis not present

## 2018-12-04 DIAGNOSIS — J449 Chronic obstructive pulmonary disease, unspecified: Secondary | ICD-10-CM | POA: Diagnosis not present

## 2018-12-04 DIAGNOSIS — Z48812 Encounter for surgical aftercare following surgery on the circulatory system: Secondary | ICD-10-CM | POA: Diagnosis not present

## 2018-12-04 DIAGNOSIS — I110 Hypertensive heart disease with heart failure: Secondary | ICD-10-CM | POA: Diagnosis not present

## 2018-12-04 DIAGNOSIS — Z7982 Long term (current) use of aspirin: Secondary | ICD-10-CM | POA: Diagnosis not present

## 2018-12-04 DIAGNOSIS — Z950 Presence of cardiac pacemaker: Secondary | ICD-10-CM | POA: Diagnosis not present

## 2018-12-06 DIAGNOSIS — I4891 Unspecified atrial fibrillation: Secondary | ICD-10-CM | POA: Diagnosis not present

## 2018-12-06 DIAGNOSIS — J449 Chronic obstructive pulmonary disease, unspecified: Secondary | ICD-10-CM | POA: Diagnosis not present

## 2018-12-06 DIAGNOSIS — Z950 Presence of cardiac pacemaker: Secondary | ICD-10-CM | POA: Diagnosis not present

## 2018-12-06 DIAGNOSIS — I5033 Acute on chronic diastolic (congestive) heart failure: Secondary | ICD-10-CM | POA: Diagnosis not present

## 2018-12-06 DIAGNOSIS — Z7901 Long term (current) use of anticoagulants: Secondary | ICD-10-CM | POA: Diagnosis not present

## 2018-12-06 DIAGNOSIS — Z48812 Encounter for surgical aftercare following surgery on the circulatory system: Secondary | ICD-10-CM | POA: Diagnosis not present

## 2018-12-06 DIAGNOSIS — I110 Hypertensive heart disease with heart failure: Secondary | ICD-10-CM | POA: Diagnosis not present

## 2018-12-06 DIAGNOSIS — Z7982 Long term (current) use of aspirin: Secondary | ICD-10-CM | POA: Diagnosis not present

## 2018-12-11 DIAGNOSIS — I4891 Unspecified atrial fibrillation: Secondary | ICD-10-CM | POA: Diagnosis not present

## 2018-12-11 DIAGNOSIS — J449 Chronic obstructive pulmonary disease, unspecified: Secondary | ICD-10-CM | POA: Diagnosis not present

## 2018-12-11 DIAGNOSIS — I110 Hypertensive heart disease with heart failure: Secondary | ICD-10-CM | POA: Diagnosis not present

## 2018-12-11 DIAGNOSIS — I5033 Acute on chronic diastolic (congestive) heart failure: Secondary | ICD-10-CM | POA: Diagnosis not present

## 2018-12-11 DIAGNOSIS — Z48812 Encounter for surgical aftercare following surgery on the circulatory system: Secondary | ICD-10-CM | POA: Diagnosis not present

## 2018-12-11 DIAGNOSIS — Z7982 Long term (current) use of aspirin: Secondary | ICD-10-CM | POA: Diagnosis not present

## 2018-12-11 DIAGNOSIS — Z950 Presence of cardiac pacemaker: Secondary | ICD-10-CM | POA: Diagnosis not present

## 2018-12-11 DIAGNOSIS — Z7901 Long term (current) use of anticoagulants: Secondary | ICD-10-CM | POA: Diagnosis not present

## 2018-12-20 DIAGNOSIS — Z950 Presence of cardiac pacemaker: Secondary | ICD-10-CM | POA: Diagnosis not present

## 2018-12-20 DIAGNOSIS — Z7901 Long term (current) use of anticoagulants: Secondary | ICD-10-CM | POA: Diagnosis not present

## 2018-12-20 DIAGNOSIS — I5033 Acute on chronic diastolic (congestive) heart failure: Secondary | ICD-10-CM | POA: Diagnosis not present

## 2018-12-20 DIAGNOSIS — J449 Chronic obstructive pulmonary disease, unspecified: Secondary | ICD-10-CM | POA: Diagnosis not present

## 2018-12-20 DIAGNOSIS — I4891 Unspecified atrial fibrillation: Secondary | ICD-10-CM | POA: Diagnosis not present

## 2018-12-20 DIAGNOSIS — Z48812 Encounter for surgical aftercare following surgery on the circulatory system: Secondary | ICD-10-CM | POA: Diagnosis not present

## 2018-12-20 DIAGNOSIS — I110 Hypertensive heart disease with heart failure: Secondary | ICD-10-CM | POA: Diagnosis not present

## 2018-12-20 DIAGNOSIS — Z7982 Long term (current) use of aspirin: Secondary | ICD-10-CM | POA: Diagnosis not present

## 2018-12-29 DIAGNOSIS — J449 Chronic obstructive pulmonary disease, unspecified: Secondary | ICD-10-CM | POA: Diagnosis not present

## 2018-12-29 DIAGNOSIS — Z48812 Encounter for surgical aftercare following surgery on the circulatory system: Secondary | ICD-10-CM | POA: Diagnosis not present

## 2018-12-29 DIAGNOSIS — I5033 Acute on chronic diastolic (congestive) heart failure: Secondary | ICD-10-CM | POA: Diagnosis not present

## 2018-12-29 DIAGNOSIS — I4891 Unspecified atrial fibrillation: Secondary | ICD-10-CM | POA: Diagnosis not present

## 2018-12-29 DIAGNOSIS — I110 Hypertensive heart disease with heart failure: Secondary | ICD-10-CM | POA: Diagnosis not present

## 2018-12-29 DIAGNOSIS — Z7901 Long term (current) use of anticoagulants: Secondary | ICD-10-CM | POA: Diagnosis not present

## 2018-12-29 DIAGNOSIS — Z950 Presence of cardiac pacemaker: Secondary | ICD-10-CM | POA: Diagnosis not present

## 2018-12-29 DIAGNOSIS — Z7982 Long term (current) use of aspirin: Secondary | ICD-10-CM | POA: Diagnosis not present

## 2019-01-01 DIAGNOSIS — I110 Hypertensive heart disease with heart failure: Secondary | ICD-10-CM | POA: Diagnosis not present

## 2019-01-01 DIAGNOSIS — J449 Chronic obstructive pulmonary disease, unspecified: Secondary | ICD-10-CM | POA: Diagnosis not present

## 2019-01-01 DIAGNOSIS — I5033 Acute on chronic diastolic (congestive) heart failure: Secondary | ICD-10-CM | POA: Diagnosis not present

## 2019-01-01 DIAGNOSIS — Z950 Presence of cardiac pacemaker: Secondary | ICD-10-CM | POA: Diagnosis not present

## 2019-01-01 DIAGNOSIS — Z48812 Encounter for surgical aftercare following surgery on the circulatory system: Secondary | ICD-10-CM | POA: Diagnosis not present

## 2019-01-01 DIAGNOSIS — Z7901 Long term (current) use of anticoagulants: Secondary | ICD-10-CM | POA: Diagnosis not present

## 2019-01-01 DIAGNOSIS — I4891 Unspecified atrial fibrillation: Secondary | ICD-10-CM | POA: Diagnosis not present

## 2019-01-01 DIAGNOSIS — Z7982 Long term (current) use of aspirin: Secondary | ICD-10-CM | POA: Diagnosis not present

## 2019-01-10 DIAGNOSIS — J449 Chronic obstructive pulmonary disease, unspecified: Secondary | ICD-10-CM | POA: Diagnosis not present

## 2019-01-10 DIAGNOSIS — I10 Essential (primary) hypertension: Secondary | ICD-10-CM | POA: Diagnosis not present

## 2019-01-10 DIAGNOSIS — I4891 Unspecified atrial fibrillation: Secondary | ICD-10-CM | POA: Diagnosis not present

## 2019-01-10 DIAGNOSIS — Z950 Presence of cardiac pacemaker: Secondary | ICD-10-CM | POA: Diagnosis not present

## 2019-01-10 DIAGNOSIS — I5033 Acute on chronic diastolic (congestive) heart failure: Secondary | ICD-10-CM | POA: Diagnosis not present

## 2019-01-10 DIAGNOSIS — Z7901 Long term (current) use of anticoagulants: Secondary | ICD-10-CM | POA: Diagnosis not present

## 2019-01-10 DIAGNOSIS — Z7982 Long term (current) use of aspirin: Secondary | ICD-10-CM | POA: Diagnosis not present

## 2019-01-10 DIAGNOSIS — I110 Hypertensive heart disease with heart failure: Secondary | ICD-10-CM | POA: Diagnosis not present

## 2019-01-15 DIAGNOSIS — I4891 Unspecified atrial fibrillation: Secondary | ICD-10-CM | POA: Diagnosis not present

## 2019-01-15 DIAGNOSIS — Z7901 Long term (current) use of anticoagulants: Secondary | ICD-10-CM | POA: Diagnosis not present

## 2019-01-15 DIAGNOSIS — I5033 Acute on chronic diastolic (congestive) heart failure: Secondary | ICD-10-CM | POA: Diagnosis not present

## 2019-01-15 DIAGNOSIS — J449 Chronic obstructive pulmonary disease, unspecified: Secondary | ICD-10-CM | POA: Diagnosis not present

## 2019-01-15 DIAGNOSIS — Z7982 Long term (current) use of aspirin: Secondary | ICD-10-CM | POA: Diagnosis not present

## 2019-01-15 DIAGNOSIS — Z950 Presence of cardiac pacemaker: Secondary | ICD-10-CM | POA: Diagnosis not present

## 2019-01-15 DIAGNOSIS — I110 Hypertensive heart disease with heart failure: Secondary | ICD-10-CM | POA: Diagnosis not present

## 2019-01-22 DIAGNOSIS — I110 Hypertensive heart disease with heart failure: Secondary | ICD-10-CM | POA: Diagnosis not present

## 2019-01-22 DIAGNOSIS — J449 Chronic obstructive pulmonary disease, unspecified: Secondary | ICD-10-CM | POA: Diagnosis not present

## 2019-01-22 DIAGNOSIS — Z7901 Long term (current) use of anticoagulants: Secondary | ICD-10-CM | POA: Diagnosis not present

## 2019-01-22 DIAGNOSIS — I5033 Acute on chronic diastolic (congestive) heart failure: Secondary | ICD-10-CM | POA: Diagnosis not present

## 2019-01-22 DIAGNOSIS — I4891 Unspecified atrial fibrillation: Secondary | ICD-10-CM | POA: Diagnosis not present

## 2019-01-22 DIAGNOSIS — Z950 Presence of cardiac pacemaker: Secondary | ICD-10-CM | POA: Diagnosis not present

## 2019-01-22 DIAGNOSIS — Z7982 Long term (current) use of aspirin: Secondary | ICD-10-CM | POA: Diagnosis not present

## 2019-01-24 DIAGNOSIS — E782 Mixed hyperlipidemia: Secondary | ICD-10-CM | POA: Diagnosis not present

## 2019-01-24 DIAGNOSIS — D7589 Other specified diseases of blood and blood-forming organs: Secondary | ICD-10-CM | POA: Diagnosis not present

## 2019-01-24 DIAGNOSIS — I480 Paroxysmal atrial fibrillation: Secondary | ICD-10-CM | POA: Diagnosis not present

## 2019-01-24 DIAGNOSIS — R634 Abnormal weight loss: Secondary | ICD-10-CM | POA: Diagnosis not present

## 2019-01-24 DIAGNOSIS — I4891 Unspecified atrial fibrillation: Secondary | ICD-10-CM | POA: Diagnosis not present

## 2019-01-24 DIAGNOSIS — D472 Monoclonal gammopathy: Secondary | ICD-10-CM | POA: Diagnosis not present

## 2019-01-24 DIAGNOSIS — E039 Hypothyroidism, unspecified: Secondary | ICD-10-CM | POA: Diagnosis not present

## 2019-01-24 DIAGNOSIS — J449 Chronic obstructive pulmonary disease, unspecified: Secondary | ICD-10-CM | POA: Diagnosis not present

## 2019-01-29 DIAGNOSIS — I4510 Unspecified right bundle-branch block: Secondary | ICD-10-CM | POA: Diagnosis not present

## 2019-01-29 DIAGNOSIS — I10 Essential (primary) hypertension: Secondary | ICD-10-CM | POA: Diagnosis not present

## 2019-01-29 DIAGNOSIS — E782 Mixed hyperlipidemia: Secondary | ICD-10-CM | POA: Diagnosis not present

## 2019-01-29 DIAGNOSIS — I480 Paroxysmal atrial fibrillation: Secondary | ICD-10-CM | POA: Diagnosis not present

## 2019-01-29 DIAGNOSIS — J449 Chronic obstructive pulmonary disease, unspecified: Secondary | ICD-10-CM | POA: Diagnosis not present

## 2019-01-29 DIAGNOSIS — R001 Bradycardia, unspecified: Secondary | ICD-10-CM | POA: Diagnosis not present

## 2019-02-05 DIAGNOSIS — Z7901 Long term (current) use of anticoagulants: Secondary | ICD-10-CM | POA: Diagnosis not present

## 2019-02-05 DIAGNOSIS — I4891 Unspecified atrial fibrillation: Secondary | ICD-10-CM | POA: Diagnosis not present

## 2019-02-05 DIAGNOSIS — Z950 Presence of cardiac pacemaker: Secondary | ICD-10-CM | POA: Diagnosis not present

## 2019-02-05 DIAGNOSIS — I110 Hypertensive heart disease with heart failure: Secondary | ICD-10-CM | POA: Diagnosis not present

## 2019-02-05 DIAGNOSIS — J449 Chronic obstructive pulmonary disease, unspecified: Secondary | ICD-10-CM | POA: Diagnosis not present

## 2019-02-05 DIAGNOSIS — Z7982 Long term (current) use of aspirin: Secondary | ICD-10-CM | POA: Diagnosis not present

## 2019-02-05 DIAGNOSIS — I5033 Acute on chronic diastolic (congestive) heart failure: Secondary | ICD-10-CM | POA: Diagnosis not present

## 2019-02-09 DIAGNOSIS — J449 Chronic obstructive pulmonary disease, unspecified: Secondary | ICD-10-CM | POA: Diagnosis not present

## 2019-02-09 DIAGNOSIS — Z7901 Long term (current) use of anticoagulants: Secondary | ICD-10-CM | POA: Diagnosis not present

## 2019-02-09 DIAGNOSIS — I5033 Acute on chronic diastolic (congestive) heart failure: Secondary | ICD-10-CM | POA: Diagnosis not present

## 2019-02-09 DIAGNOSIS — Z950 Presence of cardiac pacemaker: Secondary | ICD-10-CM | POA: Diagnosis not present

## 2019-02-09 DIAGNOSIS — I4891 Unspecified atrial fibrillation: Secondary | ICD-10-CM | POA: Diagnosis not present

## 2019-02-09 DIAGNOSIS — I110 Hypertensive heart disease with heart failure: Secondary | ICD-10-CM | POA: Diagnosis not present

## 2019-02-09 DIAGNOSIS — Z7982 Long term (current) use of aspirin: Secondary | ICD-10-CM | POA: Diagnosis not present

## 2019-02-10 IMAGING — CR DG CHEST 2V
2 series · 2 of 2 positions shown · non-contrast
Comparison: Chest x-ray 11/06/2009.

CLINICAL DATA: 88-year-old male with increasing shortness of breath
today. Labored breathing. Audible crackles on exam.

EXAM:
CHEST  2 VIEW

[chest pa]
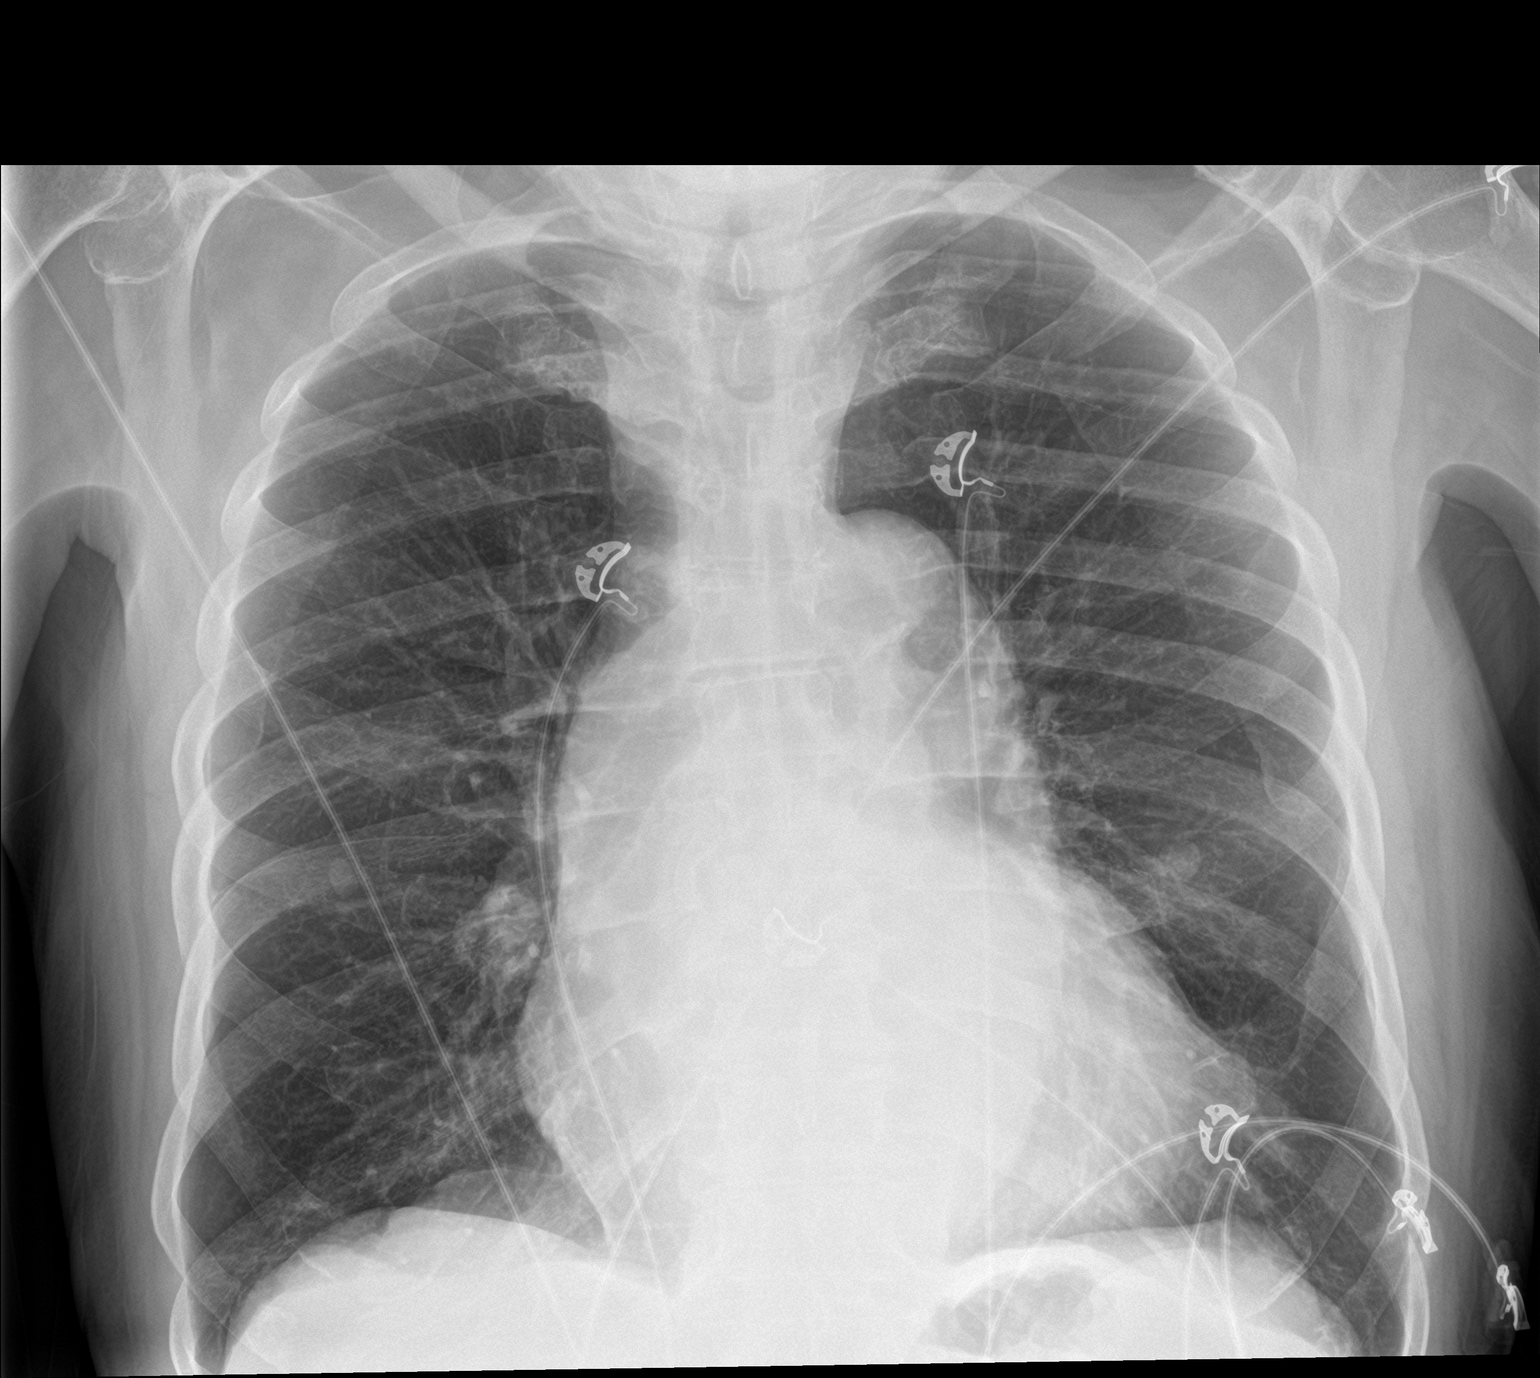

[chest lat]
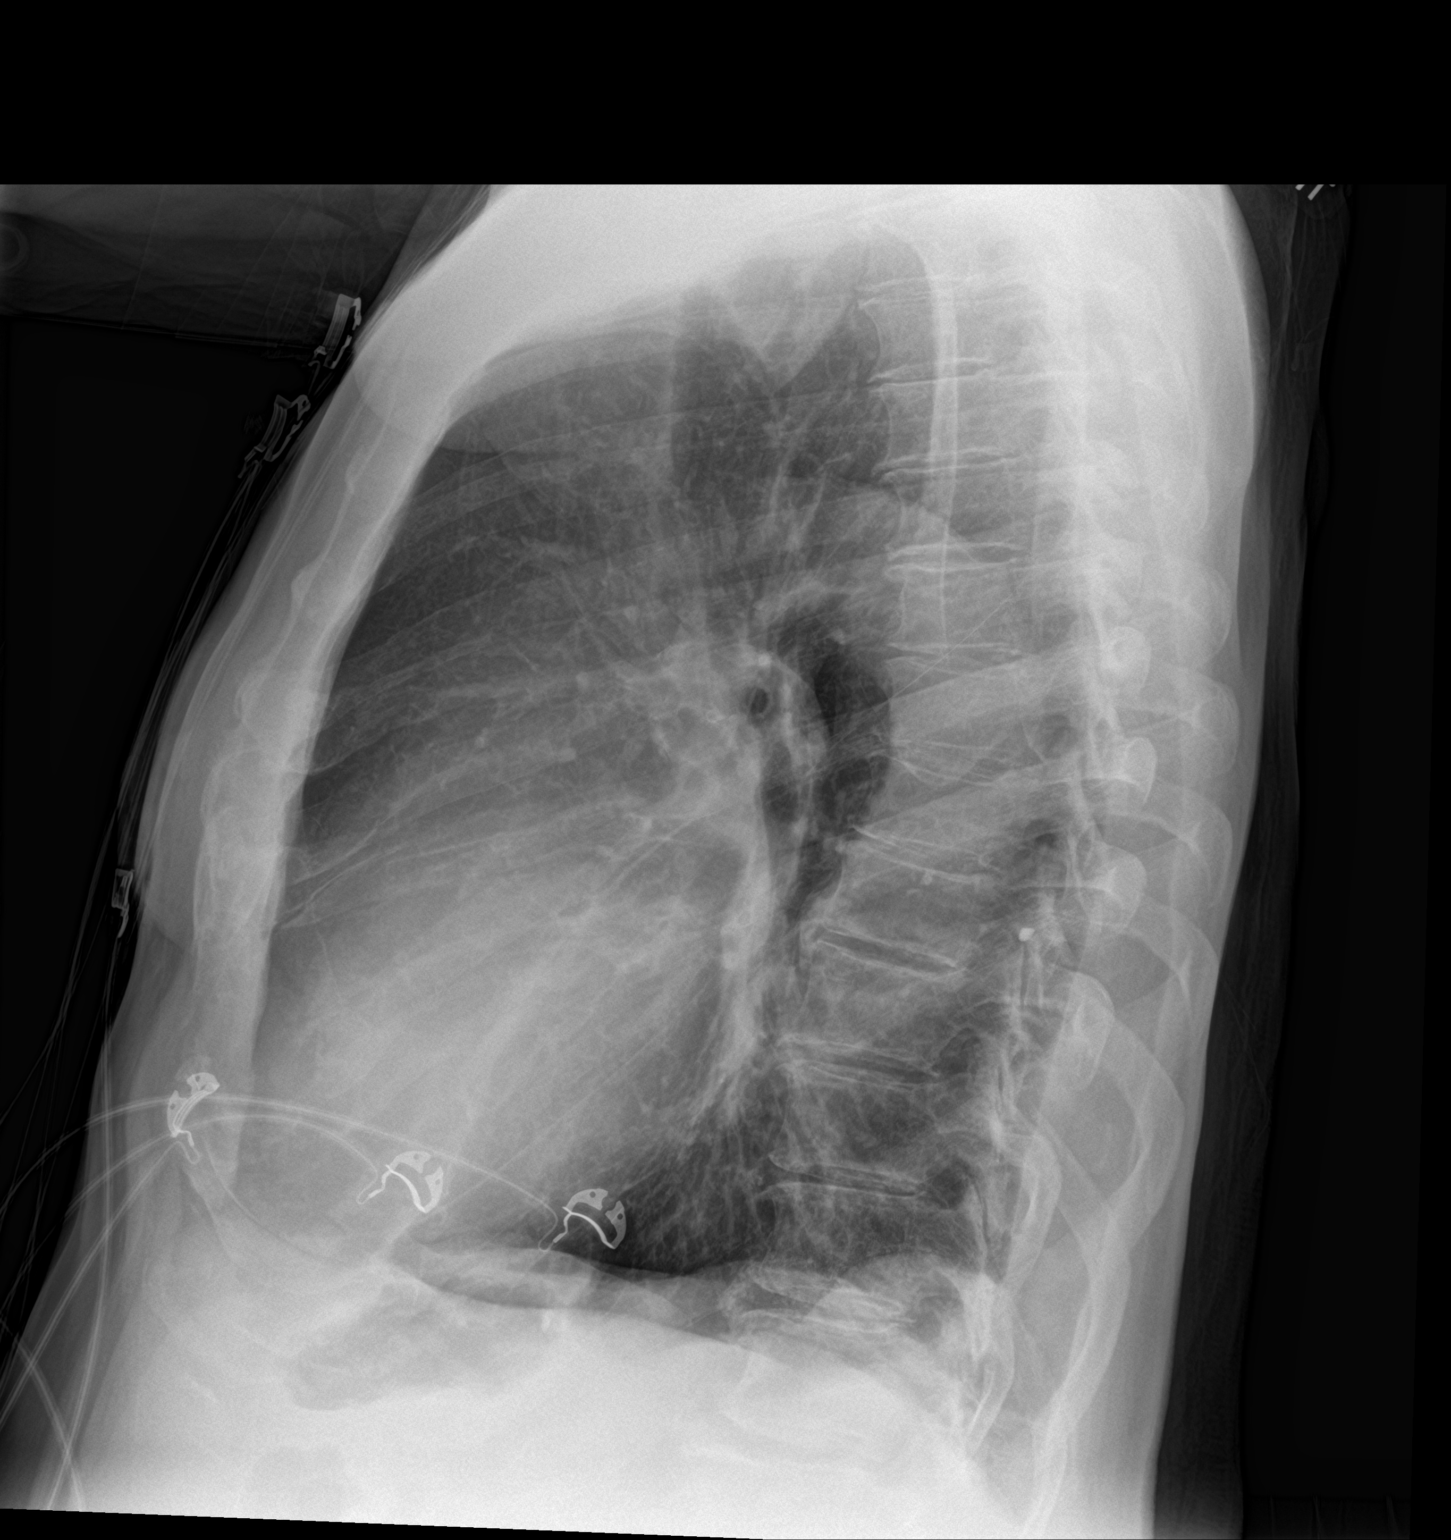

[2 of 2 positions shown; findings below may reference images not displayed]

FINDINGS: No acute consolidative airspace disease. No pleural effusions. No
evidence of pulmonary edema. Heart size is mildly enlarged with
prominence of the left ventricular contour, suggesting left
ventricular hypertrophy. Upper mediastinal contours are within
normal limits. Aortic atherosclerosis. Prominent nipple shadows
stimulating bilateral pulmonary nodules. No other suspicious
appearing pulmonary nodule or mass is noted.
IMPRESSION: 1. No radiographic evidence of acute cardiopulmonary disease.
2. Cardiomegaly with evidence of left ventricular hypertrophy.
3. Aortic atherosclerosis.

## 2019-02-12 DIAGNOSIS — Z7901 Long term (current) use of anticoagulants: Secondary | ICD-10-CM | POA: Diagnosis not present

## 2019-02-12 DIAGNOSIS — Z950 Presence of cardiac pacemaker: Secondary | ICD-10-CM | POA: Diagnosis not present

## 2019-02-12 DIAGNOSIS — I110 Hypertensive heart disease with heart failure: Secondary | ICD-10-CM | POA: Diagnosis not present

## 2019-02-12 DIAGNOSIS — I4891 Unspecified atrial fibrillation: Secondary | ICD-10-CM | POA: Diagnosis not present

## 2019-02-12 DIAGNOSIS — I5033 Acute on chronic diastolic (congestive) heart failure: Secondary | ICD-10-CM | POA: Diagnosis not present

## 2019-02-12 DIAGNOSIS — J449 Chronic obstructive pulmonary disease, unspecified: Secondary | ICD-10-CM | POA: Diagnosis not present

## 2019-02-12 DIAGNOSIS — Z7982 Long term (current) use of aspirin: Secondary | ICD-10-CM | POA: Diagnosis not present

## 2019-02-16 DIAGNOSIS — I110 Hypertensive heart disease with heart failure: Secondary | ICD-10-CM | POA: Diagnosis not present

## 2019-02-16 DIAGNOSIS — Z950 Presence of cardiac pacemaker: Secondary | ICD-10-CM | POA: Diagnosis not present

## 2019-02-16 DIAGNOSIS — J449 Chronic obstructive pulmonary disease, unspecified: Secondary | ICD-10-CM | POA: Diagnosis not present

## 2019-02-16 DIAGNOSIS — I5033 Acute on chronic diastolic (congestive) heart failure: Secondary | ICD-10-CM | POA: Diagnosis not present

## 2019-02-16 DIAGNOSIS — Z7982 Long term (current) use of aspirin: Secondary | ICD-10-CM | POA: Diagnosis not present

## 2019-02-16 DIAGNOSIS — I4891 Unspecified atrial fibrillation: Secondary | ICD-10-CM | POA: Diagnosis not present

## 2019-02-16 DIAGNOSIS — Z7901 Long term (current) use of anticoagulants: Secondary | ICD-10-CM | POA: Diagnosis not present

## 2019-02-19 DIAGNOSIS — Z950 Presence of cardiac pacemaker: Secondary | ICD-10-CM | POA: Diagnosis not present

## 2019-02-19 DIAGNOSIS — I5033 Acute on chronic diastolic (congestive) heart failure: Secondary | ICD-10-CM | POA: Diagnosis not present

## 2019-02-19 DIAGNOSIS — D472 Monoclonal gammopathy: Secondary | ICD-10-CM | POA: Diagnosis not present

## 2019-02-19 DIAGNOSIS — E039 Hypothyroidism, unspecified: Secondary | ICD-10-CM | POA: Diagnosis not present

## 2019-02-19 DIAGNOSIS — R634 Abnormal weight loss: Secondary | ICD-10-CM | POA: Diagnosis not present

## 2019-02-19 DIAGNOSIS — J449 Chronic obstructive pulmonary disease, unspecified: Secondary | ICD-10-CM | POA: Diagnosis not present

## 2019-02-19 DIAGNOSIS — I4891 Unspecified atrial fibrillation: Secondary | ICD-10-CM | POA: Diagnosis not present

## 2019-02-19 DIAGNOSIS — Z7982 Long term (current) use of aspirin: Secondary | ICD-10-CM | POA: Diagnosis not present

## 2019-02-19 DIAGNOSIS — Z7901 Long term (current) use of anticoagulants: Secondary | ICD-10-CM | POA: Diagnosis not present

## 2019-02-19 DIAGNOSIS — I110 Hypertensive heart disease with heart failure: Secondary | ICD-10-CM | POA: Diagnosis not present

## 2019-02-21 DIAGNOSIS — I4891 Unspecified atrial fibrillation: Secondary | ICD-10-CM | POA: Diagnosis not present

## 2019-02-21 DIAGNOSIS — I110 Hypertensive heart disease with heart failure: Secondary | ICD-10-CM | POA: Diagnosis not present

## 2019-02-21 DIAGNOSIS — J449 Chronic obstructive pulmonary disease, unspecified: Secondary | ICD-10-CM | POA: Diagnosis not present

## 2019-02-21 DIAGNOSIS — Z7901 Long term (current) use of anticoagulants: Secondary | ICD-10-CM | POA: Diagnosis not present

## 2019-02-21 DIAGNOSIS — I5033 Acute on chronic diastolic (congestive) heart failure: Secondary | ICD-10-CM | POA: Diagnosis not present

## 2019-02-21 DIAGNOSIS — Z950 Presence of cardiac pacemaker: Secondary | ICD-10-CM | POA: Diagnosis not present

## 2019-02-21 DIAGNOSIS — Z7982 Long term (current) use of aspirin: Secondary | ICD-10-CM | POA: Diagnosis not present

## 2019-02-23 DIAGNOSIS — I5033 Acute on chronic diastolic (congestive) heart failure: Secondary | ICD-10-CM | POA: Diagnosis not present

## 2019-02-23 DIAGNOSIS — Z7901 Long term (current) use of anticoagulants: Secondary | ICD-10-CM | POA: Diagnosis not present

## 2019-02-23 DIAGNOSIS — Z950 Presence of cardiac pacemaker: Secondary | ICD-10-CM | POA: Diagnosis not present

## 2019-02-23 DIAGNOSIS — I110 Hypertensive heart disease with heart failure: Secondary | ICD-10-CM | POA: Diagnosis not present

## 2019-02-23 DIAGNOSIS — I4891 Unspecified atrial fibrillation: Secondary | ICD-10-CM | POA: Diagnosis not present

## 2019-02-23 DIAGNOSIS — J449 Chronic obstructive pulmonary disease, unspecified: Secondary | ICD-10-CM | POA: Diagnosis not present

## 2019-02-23 DIAGNOSIS — Z7982 Long term (current) use of aspirin: Secondary | ICD-10-CM | POA: Diagnosis not present

## 2019-02-26 DIAGNOSIS — I5033 Acute on chronic diastolic (congestive) heart failure: Secondary | ICD-10-CM | POA: Diagnosis not present

## 2019-02-26 DIAGNOSIS — Z7982 Long term (current) use of aspirin: Secondary | ICD-10-CM | POA: Diagnosis not present

## 2019-02-26 DIAGNOSIS — Z7901 Long term (current) use of anticoagulants: Secondary | ICD-10-CM | POA: Diagnosis not present

## 2019-02-26 DIAGNOSIS — I4891 Unspecified atrial fibrillation: Secondary | ICD-10-CM | POA: Diagnosis not present

## 2019-02-26 DIAGNOSIS — I110 Hypertensive heart disease with heart failure: Secondary | ICD-10-CM | POA: Diagnosis not present

## 2019-02-26 DIAGNOSIS — J449 Chronic obstructive pulmonary disease, unspecified: Secondary | ICD-10-CM | POA: Diagnosis not present

## 2019-02-26 DIAGNOSIS — Z950 Presence of cardiac pacemaker: Secondary | ICD-10-CM | POA: Diagnosis not present

## 2019-03-01 DIAGNOSIS — Z7901 Long term (current) use of anticoagulants: Secondary | ICD-10-CM | POA: Diagnosis not present

## 2019-03-01 DIAGNOSIS — Z7982 Long term (current) use of aspirin: Secondary | ICD-10-CM | POA: Diagnosis not present

## 2019-03-01 DIAGNOSIS — J449 Chronic obstructive pulmonary disease, unspecified: Secondary | ICD-10-CM | POA: Diagnosis not present

## 2019-03-01 DIAGNOSIS — I110 Hypertensive heart disease with heart failure: Secondary | ICD-10-CM | POA: Diagnosis not present

## 2019-03-01 DIAGNOSIS — I4891 Unspecified atrial fibrillation: Secondary | ICD-10-CM | POA: Diagnosis not present

## 2019-03-01 DIAGNOSIS — I5033 Acute on chronic diastolic (congestive) heart failure: Secondary | ICD-10-CM | POA: Diagnosis not present

## 2019-03-01 DIAGNOSIS — Z950 Presence of cardiac pacemaker: Secondary | ICD-10-CM | POA: Diagnosis not present

## 2019-03-05 DIAGNOSIS — E782 Mixed hyperlipidemia: Secondary | ICD-10-CM | POA: Diagnosis not present

## 2019-03-05 DIAGNOSIS — I110 Hypertensive heart disease with heart failure: Secondary | ICD-10-CM | POA: Diagnosis not present

## 2019-03-05 DIAGNOSIS — I5033 Acute on chronic diastolic (congestive) heart failure: Secondary | ICD-10-CM | POA: Diagnosis not present

## 2019-03-05 DIAGNOSIS — J449 Chronic obstructive pulmonary disease, unspecified: Secondary | ICD-10-CM | POA: Diagnosis not present

## 2019-03-05 DIAGNOSIS — R634 Abnormal weight loss: Secondary | ICD-10-CM | POA: Diagnosis not present

## 2019-03-05 DIAGNOSIS — E039 Hypothyroidism, unspecified: Secondary | ICD-10-CM | POA: Diagnosis not present

## 2019-03-05 DIAGNOSIS — D472 Monoclonal gammopathy: Secondary | ICD-10-CM | POA: Diagnosis not present

## 2019-03-05 DIAGNOSIS — I444 Left anterior fascicular block: Secondary | ICD-10-CM | POA: Diagnosis not present

## 2019-03-05 DIAGNOSIS — I480 Paroxysmal atrial fibrillation: Secondary | ICD-10-CM | POA: Diagnosis not present

## 2019-03-06 DIAGNOSIS — E782 Mixed hyperlipidemia: Secondary | ICD-10-CM | POA: Diagnosis not present

## 2019-03-06 DIAGNOSIS — I110 Hypertensive heart disease with heart failure: Secondary | ICD-10-CM | POA: Diagnosis not present

## 2019-03-06 DIAGNOSIS — E039 Hypothyroidism, unspecified: Secondary | ICD-10-CM | POA: Diagnosis not present

## 2019-03-06 DIAGNOSIS — I444 Left anterior fascicular block: Secondary | ICD-10-CM | POA: Diagnosis not present

## 2019-03-06 DIAGNOSIS — J449 Chronic obstructive pulmonary disease, unspecified: Secondary | ICD-10-CM | POA: Diagnosis not present

## 2019-03-06 DIAGNOSIS — D472 Monoclonal gammopathy: Secondary | ICD-10-CM | POA: Diagnosis not present

## 2019-03-06 DIAGNOSIS — R634 Abnormal weight loss: Secondary | ICD-10-CM | POA: Diagnosis not present

## 2019-03-06 DIAGNOSIS — I480 Paroxysmal atrial fibrillation: Secondary | ICD-10-CM | POA: Diagnosis not present

## 2019-03-06 DIAGNOSIS — I5033 Acute on chronic diastolic (congestive) heart failure: Secondary | ICD-10-CM | POA: Diagnosis not present

## 2019-03-07 DIAGNOSIS — R634 Abnormal weight loss: Secondary | ICD-10-CM | POA: Diagnosis not present

## 2019-03-07 DIAGNOSIS — J449 Chronic obstructive pulmonary disease, unspecified: Secondary | ICD-10-CM | POA: Diagnosis not present

## 2019-03-07 DIAGNOSIS — I110 Hypertensive heart disease with heart failure: Secondary | ICD-10-CM | POA: Diagnosis not present

## 2019-03-07 DIAGNOSIS — E782 Mixed hyperlipidemia: Secondary | ICD-10-CM | POA: Diagnosis not present

## 2019-03-07 DIAGNOSIS — I444 Left anterior fascicular block: Secondary | ICD-10-CM | POA: Diagnosis not present

## 2019-03-07 DIAGNOSIS — I5033 Acute on chronic diastolic (congestive) heart failure: Secondary | ICD-10-CM | POA: Diagnosis not present

## 2019-03-07 DIAGNOSIS — D472 Monoclonal gammopathy: Secondary | ICD-10-CM | POA: Diagnosis not present

## 2019-03-07 DIAGNOSIS — E039 Hypothyroidism, unspecified: Secondary | ICD-10-CM | POA: Diagnosis not present

## 2019-03-07 DIAGNOSIS — I480 Paroxysmal atrial fibrillation: Secondary | ICD-10-CM | POA: Diagnosis not present

## 2019-03-12 DIAGNOSIS — D472 Monoclonal gammopathy: Secondary | ICD-10-CM | POA: Diagnosis not present

## 2019-03-12 DIAGNOSIS — R634 Abnormal weight loss: Secondary | ICD-10-CM | POA: Diagnosis not present

## 2019-03-12 DIAGNOSIS — E039 Hypothyroidism, unspecified: Secondary | ICD-10-CM | POA: Diagnosis not present

## 2019-03-12 DIAGNOSIS — J449 Chronic obstructive pulmonary disease, unspecified: Secondary | ICD-10-CM | POA: Diagnosis not present

## 2019-03-12 DIAGNOSIS — I110 Hypertensive heart disease with heart failure: Secondary | ICD-10-CM | POA: Diagnosis not present

## 2019-03-12 DIAGNOSIS — I5033 Acute on chronic diastolic (congestive) heart failure: Secondary | ICD-10-CM | POA: Diagnosis not present

## 2019-03-12 DIAGNOSIS — I480 Paroxysmal atrial fibrillation: Secondary | ICD-10-CM | POA: Diagnosis not present

## 2019-03-12 DIAGNOSIS — E782 Mixed hyperlipidemia: Secondary | ICD-10-CM | POA: Diagnosis not present

## 2019-03-12 DIAGNOSIS — I444 Left anterior fascicular block: Secondary | ICD-10-CM | POA: Diagnosis not present

## 2019-03-16 DIAGNOSIS — I5033 Acute on chronic diastolic (congestive) heart failure: Secondary | ICD-10-CM | POA: Diagnosis not present

## 2019-03-16 DIAGNOSIS — J449 Chronic obstructive pulmonary disease, unspecified: Secondary | ICD-10-CM | POA: Diagnosis not present

## 2019-03-16 DIAGNOSIS — I444 Left anterior fascicular block: Secondary | ICD-10-CM | POA: Diagnosis not present

## 2019-03-16 DIAGNOSIS — R634 Abnormal weight loss: Secondary | ICD-10-CM | POA: Diagnosis not present

## 2019-03-16 DIAGNOSIS — D472 Monoclonal gammopathy: Secondary | ICD-10-CM | POA: Diagnosis not present

## 2019-03-16 DIAGNOSIS — I480 Paroxysmal atrial fibrillation: Secondary | ICD-10-CM | POA: Diagnosis not present

## 2019-03-16 DIAGNOSIS — E782 Mixed hyperlipidemia: Secondary | ICD-10-CM | POA: Diagnosis not present

## 2019-03-16 DIAGNOSIS — E039 Hypothyroidism, unspecified: Secondary | ICD-10-CM | POA: Diagnosis not present

## 2019-03-16 DIAGNOSIS — I110 Hypertensive heart disease with heart failure: Secondary | ICD-10-CM | POA: Diagnosis not present

## 2019-03-19 DIAGNOSIS — E039 Hypothyroidism, unspecified: Secondary | ICD-10-CM | POA: Diagnosis not present

## 2019-03-19 DIAGNOSIS — D472 Monoclonal gammopathy: Secondary | ICD-10-CM | POA: Diagnosis not present

## 2019-03-19 DIAGNOSIS — I5033 Acute on chronic diastolic (congestive) heart failure: Secondary | ICD-10-CM | POA: Diagnosis not present

## 2019-03-19 DIAGNOSIS — I110 Hypertensive heart disease with heart failure: Secondary | ICD-10-CM | POA: Diagnosis not present

## 2019-03-19 DIAGNOSIS — I480 Paroxysmal atrial fibrillation: Secondary | ICD-10-CM | POA: Diagnosis not present

## 2019-03-19 DIAGNOSIS — R634 Abnormal weight loss: Secondary | ICD-10-CM | POA: Diagnosis not present

## 2019-03-19 DIAGNOSIS — I444 Left anterior fascicular block: Secondary | ICD-10-CM | POA: Diagnosis not present

## 2019-03-19 DIAGNOSIS — J449 Chronic obstructive pulmonary disease, unspecified: Secondary | ICD-10-CM | POA: Diagnosis not present

## 2019-03-19 DIAGNOSIS — E782 Mixed hyperlipidemia: Secondary | ICD-10-CM | POA: Diagnosis not present

## 2019-03-20 DIAGNOSIS — D472 Monoclonal gammopathy: Secondary | ICD-10-CM | POA: Diagnosis not present

## 2019-03-20 DIAGNOSIS — E039 Hypothyroidism, unspecified: Secondary | ICD-10-CM | POA: Diagnosis not present

## 2019-03-20 DIAGNOSIS — I5033 Acute on chronic diastolic (congestive) heart failure: Secondary | ICD-10-CM | POA: Diagnosis not present

## 2019-03-20 DIAGNOSIS — I110 Hypertensive heart disease with heart failure: Secondary | ICD-10-CM | POA: Diagnosis not present

## 2019-03-20 DIAGNOSIS — E782 Mixed hyperlipidemia: Secondary | ICD-10-CM | POA: Diagnosis not present

## 2019-03-20 DIAGNOSIS — R634 Abnormal weight loss: Secondary | ICD-10-CM | POA: Diagnosis not present

## 2019-03-20 DIAGNOSIS — I480 Paroxysmal atrial fibrillation: Secondary | ICD-10-CM | POA: Diagnosis not present

## 2019-03-20 DIAGNOSIS — I444 Left anterior fascicular block: Secondary | ICD-10-CM | POA: Diagnosis not present

## 2019-03-20 DIAGNOSIS — J449 Chronic obstructive pulmonary disease, unspecified: Secondary | ICD-10-CM | POA: Diagnosis not present

## 2019-03-21 DIAGNOSIS — I10 Essential (primary) hypertension: Secondary | ICD-10-CM | POA: Diagnosis not present

## 2019-03-21 DIAGNOSIS — I480 Paroxysmal atrial fibrillation: Secondary | ICD-10-CM | POA: Diagnosis not present

## 2019-03-21 DIAGNOSIS — D649 Anemia, unspecified: Secondary | ICD-10-CM | POA: Diagnosis not present

## 2019-03-21 DIAGNOSIS — E782 Mixed hyperlipidemia: Secondary | ICD-10-CM | POA: Diagnosis not present

## 2019-03-21 DIAGNOSIS — R946 Abnormal results of thyroid function studies: Secondary | ICD-10-CM | POA: Diagnosis not present

## 2019-03-21 DIAGNOSIS — E039 Hypothyroidism, unspecified: Secondary | ICD-10-CM | POA: Diagnosis not present

## 2019-03-22 DIAGNOSIS — I480 Paroxysmal atrial fibrillation: Secondary | ICD-10-CM | POA: Diagnosis not present

## 2019-03-22 DIAGNOSIS — I110 Hypertensive heart disease with heart failure: Secondary | ICD-10-CM | POA: Diagnosis not present

## 2019-03-22 DIAGNOSIS — E782 Mixed hyperlipidemia: Secondary | ICD-10-CM | POA: Diagnosis not present

## 2019-03-22 DIAGNOSIS — J449 Chronic obstructive pulmonary disease, unspecified: Secondary | ICD-10-CM | POA: Diagnosis not present

## 2019-03-22 DIAGNOSIS — E039 Hypothyroidism, unspecified: Secondary | ICD-10-CM | POA: Diagnosis not present

## 2019-03-22 DIAGNOSIS — I444 Left anterior fascicular block: Secondary | ICD-10-CM | POA: Diagnosis not present

## 2019-03-22 DIAGNOSIS — I5033 Acute on chronic diastolic (congestive) heart failure: Secondary | ICD-10-CM | POA: Diagnosis not present

## 2019-03-22 DIAGNOSIS — D472 Monoclonal gammopathy: Secondary | ICD-10-CM | POA: Diagnosis not present

## 2019-03-22 DIAGNOSIS — R634 Abnormal weight loss: Secondary | ICD-10-CM | POA: Diagnosis not present

## 2019-03-26 DIAGNOSIS — I480 Paroxysmal atrial fibrillation: Secondary | ICD-10-CM | POA: Diagnosis not present

## 2019-03-26 DIAGNOSIS — I444 Left anterior fascicular block: Secondary | ICD-10-CM | POA: Diagnosis not present

## 2019-03-26 DIAGNOSIS — I5033 Acute on chronic diastolic (congestive) heart failure: Secondary | ICD-10-CM | POA: Diagnosis not present

## 2019-03-26 DIAGNOSIS — D472 Monoclonal gammopathy: Secondary | ICD-10-CM | POA: Diagnosis not present

## 2019-03-26 DIAGNOSIS — I110 Hypertensive heart disease with heart failure: Secondary | ICD-10-CM | POA: Diagnosis not present

## 2019-03-26 DIAGNOSIS — E039 Hypothyroidism, unspecified: Secondary | ICD-10-CM | POA: Diagnosis not present

## 2019-03-26 DIAGNOSIS — E782 Mixed hyperlipidemia: Secondary | ICD-10-CM | POA: Diagnosis not present

## 2019-03-26 DIAGNOSIS — R634 Abnormal weight loss: Secondary | ICD-10-CM | POA: Diagnosis not present

## 2019-03-26 DIAGNOSIS — J449 Chronic obstructive pulmonary disease, unspecified: Secondary | ICD-10-CM | POA: Diagnosis not present

## 2019-03-27 DIAGNOSIS — E782 Mixed hyperlipidemia: Secondary | ICD-10-CM | POA: Diagnosis not present

## 2019-03-27 DIAGNOSIS — D472 Monoclonal gammopathy: Secondary | ICD-10-CM | POA: Diagnosis not present

## 2019-03-27 DIAGNOSIS — I444 Left anterior fascicular block: Secondary | ICD-10-CM | POA: Diagnosis not present

## 2019-03-27 DIAGNOSIS — I110 Hypertensive heart disease with heart failure: Secondary | ICD-10-CM | POA: Diagnosis not present

## 2019-03-27 DIAGNOSIS — J449 Chronic obstructive pulmonary disease, unspecified: Secondary | ICD-10-CM | POA: Diagnosis not present

## 2019-03-27 DIAGNOSIS — E039 Hypothyroidism, unspecified: Secondary | ICD-10-CM | POA: Diagnosis not present

## 2019-03-27 DIAGNOSIS — R634 Abnormal weight loss: Secondary | ICD-10-CM | POA: Diagnosis not present

## 2019-03-27 DIAGNOSIS — I480 Paroxysmal atrial fibrillation: Secondary | ICD-10-CM | POA: Diagnosis not present

## 2019-03-27 DIAGNOSIS — I5033 Acute on chronic diastolic (congestive) heart failure: Secondary | ICD-10-CM | POA: Diagnosis not present

## 2019-04-02 DIAGNOSIS — E039 Hypothyroidism, unspecified: Secondary | ICD-10-CM | POA: Diagnosis not present

## 2019-04-02 DIAGNOSIS — I444 Left anterior fascicular block: Secondary | ICD-10-CM | POA: Diagnosis not present

## 2019-04-02 DIAGNOSIS — I480 Paroxysmal atrial fibrillation: Secondary | ICD-10-CM | POA: Diagnosis not present

## 2019-04-02 DIAGNOSIS — J449 Chronic obstructive pulmonary disease, unspecified: Secondary | ICD-10-CM | POA: Diagnosis not present

## 2019-04-02 DIAGNOSIS — D472 Monoclonal gammopathy: Secondary | ICD-10-CM | POA: Diagnosis not present

## 2019-04-02 DIAGNOSIS — R634 Abnormal weight loss: Secondary | ICD-10-CM | POA: Diagnosis not present

## 2019-04-02 DIAGNOSIS — I110 Hypertensive heart disease with heart failure: Secondary | ICD-10-CM | POA: Diagnosis not present

## 2019-04-02 DIAGNOSIS — E782 Mixed hyperlipidemia: Secondary | ICD-10-CM | POA: Diagnosis not present

## 2019-04-02 DIAGNOSIS — I5033 Acute on chronic diastolic (congestive) heart failure: Secondary | ICD-10-CM | POA: Diagnosis not present

## 2019-04-03 DIAGNOSIS — D472 Monoclonal gammopathy: Secondary | ICD-10-CM | POA: Diagnosis not present

## 2019-04-03 DIAGNOSIS — E782 Mixed hyperlipidemia: Secondary | ICD-10-CM | POA: Diagnosis not present

## 2019-04-03 DIAGNOSIS — E039 Hypothyroidism, unspecified: Secondary | ICD-10-CM | POA: Diagnosis not present

## 2019-04-03 DIAGNOSIS — I110 Hypertensive heart disease with heart failure: Secondary | ICD-10-CM | POA: Diagnosis not present

## 2019-04-03 DIAGNOSIS — R634 Abnormal weight loss: Secondary | ICD-10-CM | POA: Diagnosis not present

## 2019-04-03 DIAGNOSIS — J449 Chronic obstructive pulmonary disease, unspecified: Secondary | ICD-10-CM | POA: Diagnosis not present

## 2019-04-03 DIAGNOSIS — I480 Paroxysmal atrial fibrillation: Secondary | ICD-10-CM | POA: Diagnosis not present

## 2019-04-03 DIAGNOSIS — I444 Left anterior fascicular block: Secondary | ICD-10-CM | POA: Diagnosis not present

## 2019-04-03 DIAGNOSIS — I5033 Acute on chronic diastolic (congestive) heart failure: Secondary | ICD-10-CM | POA: Diagnosis not present

## 2019-04-09 DIAGNOSIS — E039 Hypothyroidism, unspecified: Secondary | ICD-10-CM | POA: Diagnosis not present

## 2019-04-09 DIAGNOSIS — I444 Left anterior fascicular block: Secondary | ICD-10-CM | POA: Diagnosis not present

## 2019-04-09 DIAGNOSIS — E782 Mixed hyperlipidemia: Secondary | ICD-10-CM | POA: Diagnosis not present

## 2019-04-09 DIAGNOSIS — I5033 Acute on chronic diastolic (congestive) heart failure: Secondary | ICD-10-CM | POA: Diagnosis not present

## 2019-04-09 DIAGNOSIS — R001 Bradycardia, unspecified: Secondary | ICD-10-CM | POA: Diagnosis not present

## 2019-04-09 DIAGNOSIS — R634 Abnormal weight loss: Secondary | ICD-10-CM | POA: Diagnosis not present

## 2019-04-09 DIAGNOSIS — I480 Paroxysmal atrial fibrillation: Secondary | ICD-10-CM | POA: Diagnosis not present

## 2019-04-09 DIAGNOSIS — I110 Hypertensive heart disease with heart failure: Secondary | ICD-10-CM | POA: Diagnosis not present

## 2019-04-09 DIAGNOSIS — I10 Essential (primary) hypertension: Secondary | ICD-10-CM | POA: Diagnosis not present

## 2019-04-09 DIAGNOSIS — J449 Chronic obstructive pulmonary disease, unspecified: Secondary | ICD-10-CM | POA: Diagnosis not present

## 2019-04-09 DIAGNOSIS — D472 Monoclonal gammopathy: Secondary | ICD-10-CM | POA: Diagnosis not present

## 2019-04-11 ENCOUNTER — Inpatient Hospital Stay
Admission: EM | Admit: 2019-04-11 | Discharge: 2019-05-04 | DRG: 291 | Disposition: A | Discharge status: Skilled Nursing Facility | Payer: Medicare HMO | Attending: Internal Medicine | Admitting: Internal Medicine

## 2019-04-11 ENCOUNTER — Other Ambulatory Visit: Payer: Self-pay

## 2019-04-11 ENCOUNTER — Emergency Department: Payer: Medicare HMO

## 2019-04-11 DIAGNOSIS — R05 Cough: Secondary | ICD-10-CM

## 2019-04-11 DIAGNOSIS — R5381 Other malaise: Secondary | ICD-10-CM | POA: Diagnosis not present

## 2019-04-11 DIAGNOSIS — E875 Hyperkalemia: Secondary | ICD-10-CM | POA: Diagnosis not present

## 2019-04-11 DIAGNOSIS — N390 Urinary tract infection, site not specified: Secondary | ICD-10-CM | POA: Diagnosis not present

## 2019-04-11 DIAGNOSIS — L89152 Pressure ulcer of sacral region, stage 2: Secondary | ICD-10-CM | POA: Diagnosis not present

## 2019-04-11 DIAGNOSIS — I081 Rheumatic disorders of both mitral and tricuspid valves: Secondary | ICD-10-CM | POA: Diagnosis present

## 2019-04-11 DIAGNOSIS — I10 Essential (primary) hypertension: Secondary | ICD-10-CM | POA: Diagnosis not present

## 2019-04-11 DIAGNOSIS — Z7901 Long term (current) use of anticoagulants: Secondary | ICD-10-CM

## 2019-04-11 DIAGNOSIS — I509 Heart failure, unspecified: Secondary | ICD-10-CM | POA: Diagnosis not present

## 2019-04-11 DIAGNOSIS — E876 Hypokalemia: Secondary | ICD-10-CM | POA: Diagnosis not present

## 2019-04-11 DIAGNOSIS — I5033 Acute on chronic diastolic (congestive) heart failure: Secondary | ICD-10-CM | POA: Diagnosis not present

## 2019-04-11 DIAGNOSIS — E785 Hyperlipidemia, unspecified: Secondary | ICD-10-CM | POA: Diagnosis present

## 2019-04-11 DIAGNOSIS — R531 Weakness: Secondary | ICD-10-CM | POA: Diagnosis not present

## 2019-04-11 DIAGNOSIS — Z87891 Personal history of nicotine dependence: Secondary | ICD-10-CM | POA: Diagnosis not present

## 2019-04-11 DIAGNOSIS — R918 Other nonspecific abnormal finding of lung field: Secondary | ICD-10-CM | POA: Diagnosis not present

## 2019-04-11 DIAGNOSIS — Z79899 Other long term (current) drug therapy: Secondary | ICD-10-CM

## 2019-04-11 DIAGNOSIS — R0602 Shortness of breath: Secondary | ICD-10-CM | POA: Diagnosis not present

## 2019-04-11 DIAGNOSIS — I5023 Acute on chronic systolic (congestive) heart failure: Secondary | ICD-10-CM | POA: Diagnosis not present

## 2019-04-11 DIAGNOSIS — T501X5A Adverse effect of loop [high-ceiling] diuretics, initial encounter: Secondary | ICD-10-CM | POA: Diagnosis not present

## 2019-04-11 DIAGNOSIS — Z8249 Family history of ischemic heart disease and other diseases of the circulatory system: Secondary | ICD-10-CM

## 2019-04-11 DIAGNOSIS — R600 Localized edema: Secondary | ICD-10-CM

## 2019-04-11 DIAGNOSIS — I959 Hypotension, unspecified: Secondary | ICD-10-CM | POA: Diagnosis not present

## 2019-04-11 DIAGNOSIS — K219 Gastro-esophageal reflux disease without esophagitis: Secondary | ICD-10-CM | POA: Diagnosis present

## 2019-04-11 DIAGNOSIS — R634 Abnormal weight loss: Secondary | ICD-10-CM | POA: Diagnosis not present

## 2019-04-11 DIAGNOSIS — L8990 Pressure ulcer of unspecified site, unspecified stage: Secondary | ICD-10-CM | POA: Insufficient documentation

## 2019-04-11 DIAGNOSIS — I110 Hypertensive heart disease with heart failure: Secondary | ICD-10-CM | POA: Diagnosis not present

## 2019-04-11 DIAGNOSIS — I480 Paroxysmal atrial fibrillation: Secondary | ICD-10-CM | POA: Diagnosis not present

## 2019-04-11 DIAGNOSIS — I639 Cerebral infarction, unspecified: Secondary | ICD-10-CM | POA: Diagnosis not present

## 2019-04-11 DIAGNOSIS — Z7189 Other specified counseling: Secondary | ICD-10-CM

## 2019-04-11 DIAGNOSIS — I452 Bifascicular block: Secondary | ICD-10-CM | POA: Diagnosis present

## 2019-04-11 DIAGNOSIS — Z950 Presence of cardiac pacemaker: Secondary | ICD-10-CM

## 2019-04-11 DIAGNOSIS — E119 Type 2 diabetes mellitus without complications: Secondary | ICD-10-CM | POA: Diagnosis present

## 2019-04-11 DIAGNOSIS — Z515 Encounter for palliative care: Secondary | ICD-10-CM | POA: Diagnosis not present

## 2019-04-11 DIAGNOSIS — I517 Cardiomegaly: Secondary | ICD-10-CM | POA: Diagnosis not present

## 2019-04-11 DIAGNOSIS — N179 Acute kidney failure, unspecified: Secondary | ICD-10-CM | POA: Diagnosis not present

## 2019-04-11 DIAGNOSIS — I4821 Permanent atrial fibrillation: Secondary | ICD-10-CM | POA: Diagnosis not present

## 2019-04-11 DIAGNOSIS — I4891 Unspecified atrial fibrillation: Secondary | ICD-10-CM | POA: Diagnosis not present

## 2019-04-11 DIAGNOSIS — E039 Hypothyroidism, unspecified: Secondary | ICD-10-CM | POA: Diagnosis not present

## 2019-04-11 DIAGNOSIS — D472 Monoclonal gammopathy: Secondary | ICD-10-CM | POA: Diagnosis not present

## 2019-04-11 DIAGNOSIS — E871 Hypo-osmolality and hyponatremia: Secondary | ICD-10-CM | POA: Diagnosis not present

## 2019-04-11 DIAGNOSIS — I340 Nonrheumatic mitral (valve) insufficiency: Secondary | ICD-10-CM | POA: Diagnosis not present

## 2019-04-11 DIAGNOSIS — Z66 Do not resuscitate: Secondary | ICD-10-CM | POA: Diagnosis not present

## 2019-04-11 DIAGNOSIS — I248 Other forms of acute ischemic heart disease: Secondary | ICD-10-CM | POA: Diagnosis not present

## 2019-04-11 DIAGNOSIS — I5021 Acute systolic (congestive) heart failure: Secondary | ICD-10-CM | POA: Diagnosis not present

## 2019-04-11 DIAGNOSIS — Z95810 Presence of automatic (implantable) cardiac defibrillator: Secondary | ICD-10-CM | POA: Diagnosis not present

## 2019-04-11 DIAGNOSIS — Z9981 Dependence on supplemental oxygen: Secondary | ICD-10-CM | POA: Diagnosis not present

## 2019-04-11 DIAGNOSIS — I5022 Chronic systolic (congestive) heart failure: Secondary | ICD-10-CM | POA: Diagnosis not present

## 2019-04-11 DIAGNOSIS — Z20828 Contact with and (suspected) exposure to other viral communicable diseases: Secondary | ICD-10-CM | POA: Diagnosis present

## 2019-04-11 DIAGNOSIS — I482 Chronic atrial fibrillation, unspecified: Secondary | ICD-10-CM | POA: Diagnosis not present

## 2019-04-11 DIAGNOSIS — I5032 Chronic diastolic (congestive) heart failure: Secondary | ICD-10-CM | POA: Diagnosis not present

## 2019-04-11 DIAGNOSIS — M7989 Other specified soft tissue disorders: Secondary | ICD-10-CM

## 2019-04-11 DIAGNOSIS — J449 Chronic obstructive pulmonary disease, unspecified: Secondary | ICD-10-CM | POA: Diagnosis not present

## 2019-04-11 DIAGNOSIS — Z79890 Hormone replacement therapy (postmenopausal): Secondary | ICD-10-CM

## 2019-04-11 DIAGNOSIS — R4182 Altered mental status, unspecified: Secondary | ICD-10-CM | POA: Diagnosis not present

## 2019-04-11 DIAGNOSIS — F0390 Unspecified dementia without behavioral disturbance: Secondary | ICD-10-CM | POA: Diagnosis not present

## 2019-04-11 DIAGNOSIS — E782 Mixed hyperlipidemia: Secondary | ICD-10-CM | POA: Diagnosis not present

## 2019-04-11 DIAGNOSIS — M1712 Unilateral primary osteoarthritis, left knee: Secondary | ICD-10-CM | POA: Diagnosis present

## 2019-04-11 DIAGNOSIS — R42 Dizziness and giddiness: Secondary | ICD-10-CM

## 2019-04-11 DIAGNOSIS — R404 Transient alteration of awareness: Secondary | ICD-10-CM | POA: Diagnosis not present

## 2019-04-11 DIAGNOSIS — I444 Left anterior fascicular block: Secondary | ICD-10-CM | POA: Diagnosis not present

## 2019-04-11 DIAGNOSIS — M2550 Pain in unspecified joint: Secondary | ICD-10-CM | POA: Diagnosis not present

## 2019-04-11 DIAGNOSIS — Z7401 Bed confinement status: Secondary | ICD-10-CM | POA: Diagnosis not present

## 2019-04-11 DIAGNOSIS — R059 Cough, unspecified: Secondary | ICD-10-CM

## 2019-04-11 DIAGNOSIS — I6523 Occlusion and stenosis of bilateral carotid arteries: Secondary | ICD-10-CM | POA: Diagnosis not present

## 2019-04-11 DIAGNOSIS — Z7951 Long term (current) use of inhaled steroids: Secondary | ICD-10-CM

## 2019-04-11 LAB — CBC WITH DIFFERENTIAL/PLATELET
Abs Immature Granulocytes: 0.01 10*3/uL (ref 0.00–0.07)
Basophils Absolute: 0.1 10*3/uL (ref 0.0–0.1)
Basophils Relative: 1 %
Eosinophils Absolute: 0.1 10*3/uL (ref 0.0–0.5)
Eosinophils Relative: 2 %
HCT: 44.3 % (ref 39.0–52.0)
Hemoglobin: 14.5 g/dL (ref 13.0–17.0)
Immature Granulocytes: 0 %
Lymphocytes Relative: 46 %
Lymphs Abs: 1.8 10*3/uL (ref 0.7–4.0)
MCH: 35.2 pg — ABNORMAL HIGH (ref 26.0–34.0)
MCHC: 32.7 g/dL (ref 30.0–36.0)
MCV: 107.5 fL — ABNORMAL HIGH (ref 80.0–100.0)
Monocytes Absolute: 0.4 10*3/uL (ref 0.1–1.0)
Monocytes Relative: 9 %
Neutro Abs: 1.7 10*3/uL (ref 1.7–7.7)
Neutrophils Relative %: 42 %
Platelets: 124 10*3/uL — ABNORMAL LOW (ref 150–400)
RBC: 4.12 MIL/uL — ABNORMAL LOW (ref 4.22–5.81)
RDW: 15.1 % (ref 11.5–15.5)
WBC: 4.1 10*3/uL (ref 4.0–10.5)
nRBC: 0 % (ref 0.0–0.2)

## 2019-04-11 LAB — URINALYSIS, COMPLETE (UACMP) WITH MICROSCOPIC
Bacteria, UA: NONE SEEN
Bilirubin Urine: NEGATIVE
Glucose, UA: NEGATIVE mg/dL
Hgb urine dipstick: NEGATIVE
Ketones, ur: NEGATIVE mg/dL
Leukocytes,Ua: NEGATIVE
Nitrite: NEGATIVE
Protein, ur: NEGATIVE mg/dL
Specific Gravity, Urine: 1.011 (ref 1.005–1.030)
Squamous Epithelial / HPF: NONE SEEN (ref 0–5)
WBC, UA: NONE SEEN WBC/hpf (ref 0–5)
pH: 5 (ref 5.0–8.0)

## 2019-04-11 LAB — TROPONIN I (HIGH SENSITIVITY)
Troponin I (High Sensitivity): 22 ng/L — ABNORMAL HIGH (ref <18)
Troponin I (High Sensitivity): 21 ng/L — ABNORMAL HIGH (ref <18)

## 2019-04-11 LAB — COMPREHENSIVE METABOLIC PANEL
ALT: 21 U/L (ref 0–44)
AST: 24 U/L (ref 15–41)
Albumin: 3.9 g/dL (ref 3.5–5.0)
Alkaline Phosphatase: 56 U/L (ref 38–126)
Anion gap: 12 (ref 5–15)
BUN: 36 mg/dL — ABNORMAL HIGH (ref 8–23)
CO2: 23 mmol/L (ref 22–32)
Calcium: 9 mg/dL (ref 8.9–10.3)
Chloride: 103 mmol/L (ref 98–111)
Creatinine, Ser: 1.02 mg/dL (ref 0.61–1.24)
GFR calc Af Amer: >60 mL/min (ref >60)
GFR calc non Af Amer: >60 mL/min (ref >60)
Glucose, Bld: 90 mg/dL (ref 70–99)
Potassium: 3.6 mmol/L (ref 3.5–5.1)
Sodium: 138 mmol/L (ref 135–145)
Total Bilirubin: 1.5 mg/dL — ABNORMAL HIGH (ref 0.3–1.2)
Total Protein: 7 g/dL (ref 6.5–8.1)

## 2019-04-11 LAB — PROTIME-INR
INR: 3.3 — ABNORMAL HIGH (ref 0.8–1.2)
Prothrombin Time: 32.9 seconds — ABNORMAL HIGH (ref 11.4–15.2)

## 2019-04-11 LAB — TSH: TSH: 16.067 u[IU]/mL — ABNORMAL HIGH (ref 0.350–4.500)

## 2019-04-11 LAB — T4, FREE: Free T4: 1.59 ng/dL — ABNORMAL HIGH (ref 0.61–1.12)

## 2019-04-11 LAB — BRAIN NATRIURETIC PEPTIDE: B Natriuretic Peptide: 2019 pg/mL — ABNORMAL HIGH (ref 0.0–100.0)

## 2019-04-11 MED ORDER — SODIUM CHLORIDE 0.9 % IV SOLN
250.0000 mL | INTRAVENOUS | Status: DC | PRN
  Administered 2019-04-23 – 2019-04-30 (×2): 250 mL via INTRAVENOUS

## 2019-04-11 MED ORDER — SODIUM CHLORIDE 0.9% FLUSH
3.0000 mL | Freq: Two times a day (BID) | INTRAVENOUS | Status: DC
  Administered 2019-04-11 – 2019-05-04 (×44): 3 mL via INTRAVENOUS

## 2019-04-11 MED ORDER — LATANOPROST 0.005 % OP SOLN
1.0000 [drp] | Freq: Every day | OPHTHALMIC | Status: DC
  Administered 2019-04-11 – 2019-05-03 (×23): 1 [drp] via OPHTHALMIC
  Filled 2019-04-11 (×2): qty 2.5

## 2019-04-11 MED ORDER — LEVOTHYROXINE SODIUM 88 MCG PO TABS
88.0000 ug | ORAL_TABLET | Freq: Every day | ORAL | Status: DC
  Administered 2019-04-12 – 2019-05-04 (×18): 88 ug via ORAL
  Filled 2019-04-11 (×23): qty 1

## 2019-04-11 MED ORDER — WARFARIN SODIUM 6 MG PO TABS: 6.5000 mg | ORAL_TABLET | Freq: Every day | ORAL | Status: DC

## 2019-04-11 MED ORDER — ACETAMINOPHEN 325 MG PO TABS
650.0000 mg | ORAL_TABLET | ORAL | Status: DC | PRN
  Administered 2019-04-19 – 2019-04-22 (×2): 650 mg via ORAL
  Filled 2019-04-11 (×2): qty 2

## 2019-04-11 MED ORDER — FUROSEMIDE 10 MG/ML IJ SOLN
40.0000 mg | Freq: Once | INTRAMUSCULAR | Status: AC
  Administered 2019-04-11: 40 mg via INTRAVENOUS
  Filled 2019-04-11: qty 4

## 2019-04-11 MED ORDER — POTASSIUM CHLORIDE CRYS ER 20 MEQ PO TBCR
10.0000 meq | EXTENDED_RELEASE_TABLET | Freq: Every day | ORAL | Status: DC
  Administered 2019-04-12 – 2019-04-24 (×13): 10 meq via ORAL
  Filled 2019-04-11 (×13): qty 1

## 2019-04-11 MED ORDER — ONDANSETRON HCL 4 MG/2ML IJ SOLN: 4.0000 mg | Freq: Four times a day (QID) | INTRAMUSCULAR | Status: DC | PRN

## 2019-04-11 MED ORDER — FUROSEMIDE 10 MG/ML IJ SOLN
40.0000 mg | Freq: Two times a day (BID) | INTRAMUSCULAR | Status: DC
  Administered 2019-04-12 – 2019-04-13 (×2): 40 mg via INTRAVENOUS
  Filled 2019-04-11 (×3): qty 4

## 2019-04-11 MED ORDER — ADULT MULTIVITAMIN W/MINERALS CH
1.0000 | ORAL_TABLET | Freq: Every day | ORAL | Status: DC
  Administered 2019-04-12 – 2019-05-04 (×21): 1 via ORAL
  Filled 2019-04-11 (×22): qty 1

## 2019-04-11 MED ORDER — ALBUTEROL SULFATE (2.5 MG/3ML) 0.083% IN NEBU
2.5000 mg | INHALATION_SOLUTION | Freq: Four times a day (QID) | RESPIRATORY_TRACT | Status: DC | PRN
  Administered 2019-04-20 – 2019-04-25 (×6): 2.5 mg via RESPIRATORY_TRACT
  Filled 2019-04-11 (×7): qty 3

## 2019-04-11 MED ORDER — LEVOTHYROXINE SODIUM 50 MCG PO TABS: 75.0000 ug | ORAL_TABLET | Freq: Every day | ORAL | Status: DC

## 2019-04-11 MED ORDER — SODIUM CHLORIDE 0.9% FLUSH: 3.0000 mL | INTRAVENOUS | Status: DC | PRN

## 2019-04-11 NOTE — ED Notes (Signed)
Attempted blood draw x2 without success - requested lab to draw extra SST tube

## 2019-04-11 NOTE — ED Notes (Signed)
Green and lav sent to lab 

## 2019-04-11 NOTE — ED Provider Notes (Signed)
[facility] Emergency Department Provider Note  ____________________________________________   First MD Initiated Contact with Patient 04/11/19 1621     (approximate)  I have reviewed the triage vital signs and the nursing notes.  History  Chief Complaint Hypotension and Leg Swelling    HPI Gabriel Delacruz is a 85 y.o. male with a history of atrial fibrillation, status post pacemaker, diabetes, hypothyroidism (TSH was 105 on 8/5) who presents to the emergency department for 1 week of significantly worsening, symmetric bilateral lower extremity edema.  Patient states he always has some swelling at baseline, but over the last week it has become markedly worse.  He has difficulty walking due to the associated discomfort.  He has a home health nurse check on him once a week, who also noted that in addition to the marked swelling he has also had lower blood pressures than normal (apparently as low as 78/58).  He was seen in his cardiologist office for this on 8/24.  His losartan, hydrochlorothiazide was discontinued, and his Lasix was increased.  Family has been adhering to this at home, but states his leg swelling has only worsened.  Patient denies any fevers, nausea, vomiting, diarrhea, constipation, chest pain, cough, or shortness of breath.  He does report feeling cold all the time and daughter feels his skin is dry.         Past Medical Hx Past Medical History:  Diagnosis Date   Anemia    Atrial fibrillation (HCC)    COPD (chronic obstructive pulmonary disease) (HCC)    GERD (gastroesophageal reflux disease)    Glaucoma    Hyperlipemia    Hypertension    Hypothyroidism    Irregular heart rhythm    Left anterior fascicular block    MGUS (monoclonal gammopathy of unknown significance)    RBBB     Problem List Patient Active Problem List   Diagnosis Date Noted   Severe sinus bradycardia 10/13/2018   MGUS (monoclonal gammopathy of  unknown significance) 12/11/2015    Past Surgical Hx Past Surgical History:  Procedure Laterality Date   Cervical and lumbar disc surgeries     Cryoablation left renal mass     PACEMAKER LEADLESS INSERTION N/A 10/16/2018   Procedure: PACEMAKER LEADLESS INSERTION;  Surgeon: Isaias Cowman, MD;  Location: [facility];  Service: Cardiovascular;  Laterality: N/A;   TEMPORARY PACEMAKER Left 10/16/2018   Procedure: TEMPORARY PACEMAKER;  Surgeon: Isaias Cowman, MD;  Location: [facility];  Service: Cardiovascular;  Laterality: Left;   TONSILLECTOMY      Medications Prior to Admission medications   Medication Sig Start Date End Date Taking? Authorizing Provider  acetaminophen (TYLENOL) 325 MG tablet Take 650 mg by mouth every 6 (six) hours as needed.     [provider]  albuterol (PROAIR HFA) 108 (90 BASE) MCG/ACT inhaler Inhale 2 puffs into the lungs every 6 (six) hours as needed.     [provider]  aspirin EC 81 MG tablet Take 81 mg by mouth daily.     [provider]  dorzolamide-timolol (COSOPT) 22.3-6.8 MG/ML ophthalmic solution 1 drop Two (2) times a day as needed    [provider]  Fluticasone-Salmeterol (ADVAIR DISKUS) 250-50 MCG/DOSE AEPB Inhale 1 puff into the lungs 2 (two) times daily.     [provider]  furosemide (LASIX) 40 MG tablet Take 1 tablet (40 mg total) by mouth daily. 10/19/18   Bettey Costa, MD  losartan-hydrochlorothiazide (HYZAAR) 100-12.5  MG tablet Take 1 tablet by mouth daily.     [provider]  multivitamin-iron-minerals-folic acid (CENTRUM) chewable tablet Chew 1 tablet by mouth daily.     [provider]  potassium chloride (K-DUR) 10 MEQ tablet Take 10 mEq by mouth daily.     [provider]  Travoprost, BAK Free, (TRAVATAN Z) 0.004 % SOLN ophthalmic solution PLACE 1 DROP INTO BOTH EYES ONCE DAILY 01/25/18   [provider]  warfarin (COUMADIN) 6 MG  tablet TAKE 1 TABLET EVERY DAY 01/25/18   [provider]    Allergies Patient has no known allergies.  Family Hx Family History  Problem Relation Age of Onset   Hypertension Mother    Hypertension Father     Social Hx Social History   Tobacco Use   Smoking status: Former Smoker   Smokeless tobacco: Never Used  Substance Use Topics   Alcohol use: Not Currently   Drug use: Not Currently     Review of Systems  Constitutional: Negative for fever. Negative for chills. Eyes: Negative for visual changes. ENT: Negative for sore throat. Cardiovascular: Negative for chest pain. Respiratory: Negative for shortness of breath. Gastrointestinal: Negative for abdominal pain. Negative for nausea. Negative for vomiting. Genitourinary: Negative for dysuria. Musculoskeletal: ++ for leg swelling. Skin: Negative for rash. Neurological: Negative for for headaches.   Physical Exam  Vital Signs: ED Triage Vitals  Enc Vitals Group     BP 04/11/19 1630 103/74     Pulse --      Resp 04/11/19 1630 (!) 25     Temp --      Temp src --      SpO2 --      Weight 04/11/19 1611 170 lb (77.1 kg)     Height 04/11/19 1611 5\' 6"  (1.676 m)     Head Circumference --      Peak Flow --      Pain Score 04/11/19 1611 0     Pain Loc --      Pain Edu? --      Excl. in [place]? --      Constitutional: Alert and oriented.  Somewhat hard of hearing. Eyes: Conjunctivae clear. Sclera anicteric. Head: Normocephalic. Atraumatic. Nose: No congestion. No rhinorrhea. Mouth/Throat: Mucous membranes are moist.  Neck: No stridor.  No significant thyromegaly or tenderness. Cardiovascular: Normal rate, regular rhythm. No murmurs. Extremities well perfused. Respiratory: Normal respiratory effort.  Lungs CTAB. Gastrointestinal: Soft and non-tender. No distention.  Musculoskeletal: Significant bilateral 4+ pitting edema to lower legs to level of the knee. Neurologic:  Normal speech and language. No  gross focal neurologic deficits are appreciated.  Skin: Skin is warm, dry and intact. No rash noted. Psychiatric: Mood and affect are appropriate for situation.  EKG  Personally reviewed.   Rate: 90s Rhythm: paced Axis: abnormal due to paced rhythm Intervals: abnormal due to packing No acute ischemic changes    Radiology  CXR: IMPRESSION: Gross cardiomegaly. Subtle heterogeneous opacity of the right lung base with a probable small layering pleural effusion, concerning for infection or aspiration.     Procedures  Procedure(s) performed (including critical care):  Procedures   Initial Impression / Assessment and Plan / ED Course  85 y.o. male who presents to the ED for progressive, worsening BLE edema x 1 week (4+ pitting on exam), uncontrolled with outpatient regimen, as well as low blood pressures.   Ddx: HF, worsening LE edema, thyroid etiology, combination of multiple/multifactorial  Plan: labs,  XR, imaging, likely admission for more aggressive management given worsening status despite adjustments to his outpatient regimen  Work-up reveals BNP of 2000.  Chest x-ray with right lung opacity, likely pleural effusion. In absence of fever, cough, or leukocytosis lower suspcion for pneumonia.   Normal electrolytes and creatinine, will give 40 mg IV Lasix.  Mildly elevated high sensitive troponin, will trend, no acute ischemic changes on EKG that reveals paced rhythm.  Thyroid studies pending.  Discussed patient with hospitalist for further management of his fluid status. Will admit, patient and daughter agreeable.   Final Clinical Impression(s) / ED Diagnosis  Final diagnoses:  SOB (shortness of breath)  Leg swelling  Leg edema      Note:  This document was prepared using Dragon voice recognition software and may include unintentional dictation errors.   Lilia Pro., MD 04/11/19 [phone]

## 2019-04-11 NOTE — Plan of Care (Signed)
  Problem: Education: Goal: Knowledge of General Education information will improve Description: Including pain rating scale, medication(s)/side effects and non-pharmacologic comfort measures Outcome: Progressing Note: Patient profile complete. No complaints of pain. No dyspnea noted. Condom cath in place.

## 2019-04-11 NOTE — ED Notes (Signed)
Paige RN, aware of bed assigned  

## 2019-04-11 NOTE — H&P (Addendum)
[facility] [place] NAME: Gabriel Delacruz    MR#:  [id number]  DATE OF BIRTH:  [birth year]  DATE OF ADMISSION:  04/11/2019  PRIMARY CARE PHYSICIAN: Marinda Elk, MD   REQUESTING/REFERRING PHYSICIAN: Derrell Lolling, MD  CHIEF COMPLAINT:   Chief Complaint  Patient presents with  . Hypotension  . Leg Swelling    HISTORY OF PRESENT ILLNESS:  Gabriel Delacruz  is a 85 y.o. male with a known history of atrial fibrillation, COPD, GERD, hyperlipidemia, hypertension, hypothyroidism, monoclonal gammopathy.  Historically TSH was 105 on 03/21/2019.  He presented to the emergency room complaining of a one-week history of increasing bilateral lower extremity edema.  Patient reports edema at baseline however it has been becoming worse over the last week and is now resulting in difficulty with ambulation due to the associated discomfort.  Additionally, patient has been experiencing hypotension as low as 70s over 50s according to the patient with his home health care nurse reading.  He was recently seen by Dr. Ubaldo Glassing, his cardiologist, on 04/09/2019.  Losartan and hydrochlorothiazide was discontinued and Lasix was increased at this time.  Despite compliance with this, he has continued to have increased edema of his lower extremities.  He denies fevers, chills, nausea, vomiting, diarrhea.  He denies abdominal pain.  He denies chest pain.  He denies increased shortness of breath other than with ambulation.  BNP is 2019.  Rapid COVID-19 testing is negative.  TSH is 16.067 with free T4 1.59.  Chest x-ray demonstrates subtle heterogenous opacity of the right lung base with a probable small layering pleural effusion.  Echocardiogram 10/14/2018 demonstrates 60 to 65% ejection fraction.  We have admitted him to the hospitalist service for acute exacerbation systolic CHF and hypothyroidism.  PAST MEDICAL HISTORY:   Past Medical History:  Diagnosis Date  . Anemia   .  Atrial fibrillation ([place])   . COPD (chronic obstructive pulmonary disease) ([place])   . GERD (gastroesophageal reflux disease)   . Glaucoma   . Hyperlipemia   . Hypertension   . Hypothyroidism   . Irregular heart rhythm   . Left anterior fascicular block   . MGUS (monoclonal gammopathy of unknown significance)   . RBBB     PAST SURGICAL HISTORY:   Past Surgical History:  Procedure Laterality Date  . Cervical and lumbar disc surgeries    . Cryoablation left renal mass    . PACEMAKER LEADLESS INSERTION N/A 10/16/2018   Procedure: PACEMAKER LEADLESS INSERTION;  Surgeon: Isaias Cowman, MD;  Location: [facility];  Service: Cardiovascular;  Laterality: N/A;  . TEMPORARY PACEMAKER Left 10/16/2018   Procedure: TEMPORARY PACEMAKER;  Surgeon: Isaias Cowman, MD;  Location: [facility];  Service: Cardiovascular;  Laterality: Left;  . TONSILLECTOMY      SOCIAL HISTORY:   Social History   Tobacco Use  . Smoking status: Former Research scientist (life sciences)  . Smokeless tobacco: Never Used  Substance Use Topics  . Alcohol use: Not Currently    FAMILY HISTORY:   Family History  Problem Relation Age of Onset  . Hypertension Mother   . Hypertension Father     DRUG ALLERGIES:  No Known Allergies  REVIEW OF SYSTEMS:   Review of Systems  Constitutional: Negative for chills, fever and malaise/fatigue.  HENT: Negative for congestion and sore throat.   Eyes: Negative for blurred vision and double vision.  Respiratory: Positive for shortness of breath. Negative for cough and wheezing.  Cardiovascular: Positive for leg swelling. Negative for chest pain and palpitations.  Gastrointestinal: Negative for abdominal pain, diarrhea, heartburn, nausea and vomiting.  Genitourinary: Negative for dysuria, flank pain and hematuria.  Musculoskeletal: Negative for falls and myalgias.  Skin: Negative for itching and rash.  Neurological: Positive for weakness. Negative for dizziness and  headaches.  Psychiatric/Behavioral: Negative for depression.    MEDICATIONS AT HOME:   Prior to Admission medications   Medication Sig Start Date End Date Taking? Authorizing Provider  albuterol (PROAIR HFA) 108 (90 BASE) MCG/ACT inhaler Inhale 2 puffs into the lungs every 6 (six) hours as needed.    Yes [provider]  dorzolamide-timolol (COSOPT) 22.3-6.8 MG/ML ophthalmic solution Place 1 drop into both eyes 2 (two) times daily.    Yes [provider]  Fluticasone-Salmeterol (ADVAIR DISKUS) 250-50 MCG/DOSE AEPB Inhale 1 puff into the lungs daily.    Yes [provider]  furosemide (LASIX) 40 MG tablet Take 1 tablet (40 mg total) by mouth daily. 10/19/18  Yes Bettey Costa, MD  levothyroxine (SYNTHROID) 75 MCG tablet Take 75 mcg by mouth daily before breakfast.   Yes [provider]  multivitamin-iron-minerals-folic acid (CENTRUM) chewable tablet Chew 1 tablet by mouth daily.    Yes [provider]  potassium chloride (K-DUR) 10 MEQ tablet Take 10 mEq by mouth daily.    Yes [provider]  Travoprost, BAK Free, (TRAVATAN Z) 0.004 % SOLN ophthalmic solution PLACE 1 DROP INTO BOTH EYES ONCE DAILY 01/25/18  Yes [provider]  warfarin (COUMADIN) 6 MG tablet Take 6.5 mg by mouth daily at 6 PM.  01/25/18  Yes [provider]      VITAL SIGNS:  Blood pressure 111/75, pulse 89, resp. rate 13, height 5\' 6"  (1.676 m), weight 77.1 kg, SpO2 100 %.  PHYSICAL EXAMINATION:  Physical Exam  GENERAL:  85 y.o.-year-old patient lying in the bed with no acute distress.  EYES: Pupils equal, round, reactive to light and accommodation. No scleral icterus. Extraocular muscles intact.  HEENT: Head atraumatic, normocephalic. Oropharynx and nasopharynx clear.  NECK:  Supple, no jugular venous distention. No thyroid enlargement, no tenderness.  LUNGS: Normal breath sounds bilaterally, no wheezing, rales,rhonchi or crepitation. No use of  accessory muscles of respiration.  CARDIOVASCULAR: Regular rate and rhythm, S1, S2 normal. No murmurs, rubs, or gallops.  ABDOMEN: Soft, nondistended, nontender. Bowel sounds present. No organomegaly or mass.  EXTREMITIES: 4+ pitting pedal edema bilaterally NEUROLOGIC: Cranial nerves II through XII are intact.  Generalized weakness. Sensation intact. Gait not checked.  PSYCHIATRIC: The patient is alert and oriented x 3.  Normal affect and good eye contact. SKIN: No obvious rash, lesion, or ulcer.   LABORATORY PANEL:   CBC Recent Labs  Lab 04/11/19 1615  WBC 4.1  HGB 14.5  HCT 44.3  PLT 124*   ------------------------------------------------------------------------------------------------------------------  Chemistries  Recent Labs  Lab 04/11/19 1824  NA 138  K 3.6  CL 103  CO2 23  GLUCOSE 90  BUN 36*  CREATININE 1.02  CALCIUM 9.0  AST 24  ALT 21  ALKPHOS 56  BILITOT 1.5*   ------------------------------------------------------------------------------------------------------------------  Cardiac Enzymes No results for input(s): TROPONINI in the last 168 hours. ------------------------------------------------------------------------------------------------------------------  RADIOLOGY:  Dg Chest Port 1 View  Result Date: 04/11/2019 CLINICAL DATA:  Leg and ankle swelling, low blood pressure EXAM: PORTABLE CHEST 1 VIEW COMPARISON:  10/13/2018 FINDINGS: Gross cardiomegaly. Subtle heterogeneous opacity of the right lung base with a probable small layering pleural effusion. The  visualized skeletal structures are unremarkable. IMPRESSION: Gross cardiomegaly. Subtle heterogeneous opacity of the right lung base with a probable small layering pleural effusion, concerning for infection or aspiration. Electronically Signed   By: Eddie Candle M.D.   On: 04/11/2019 16:59      IMPRESSION AND PLAN:   1.  Acute exacerbation systolic CH --Lasix IV 40 mg twice daily -  Echocardiogram -Telemetry monitoring - CBC and BMP in the a.m. -Cardiology, Dr. Saralyn Pilar consulted for further evaluation and recommendations  2.  Hypothyroidism - Free T3 pending - Synthroid 88 mcg daily  3.  Atrial fibrillation -Coumadin continued  4.  COPD - DuoNebs every 6 hours as needed for shortness of breath  DVT prophylaxis with Coumadin -PPI prophylaxis    All the records are reviewed and case discussed with ED provider. The plan of care was discussed in details with the patient (and family). I answered all questions. The patient agreed to proceed with the above mentioned plan. Further management will depend upon hospital course.   CODE STATUS: Full code  TOTAL TIME TAKING CARE OF THIS PATIENT: 45 minutes.    Carlyss on 04/11/2019 at 9:45 PM  Pager - [phone]  After 6pm go to [email or web address] - Proofreader  [facility]  Office  [phone]  CC: Primary care physician; Marinda Elk, MD   Note: This dictation was prepared with Dragon dictation along with smaller phrase technology. Any transcriptional errors that result from this process are unintentional.  Patient seen and evaluated by me I agree with physical exam treatment plan by nurse practitioner Gardiner Barefoot NP

## 2019-04-11 NOTE — ED Triage Notes (Signed)
Pt comes via POV with daughter from home with c/o bilateral leg and ankle swelling. daughter also states pt's BP was low.  BP was 78/58. Current BP-90/68  Pt denies any CP, SOB and abdominal pain at this time.

## 2019-04-11 NOTE — ED Notes (Signed)
Pt daughter reports that she saw pt last month and that he appears much weaker than previously and that bilat lower ext swelling has increased - she stated that the pt BP was in the 70's today and the PCP said to bring to ED - Here BP is 103/74 and pt appears in NAD at this time

## 2019-04-11 NOTE — ED Notes (Signed)
ED TO INPATIENT HANDOFF REPORT  ED Nurse Name and Phone #: Tequisha Maahs [phone]  S Name/Age/Gender Gabriel Delacruz 85 y.o. male Room/Bed: ED02A/ED02A  Code Status   Code Status: Full Code  Home/SNF/Other Home Patient oriented to: self, place, time and situation Is this baseline? Yes   Triage Complete: Triage complete  Chief Complaint low bp  Triage Note Pt comes via POV with daughter from home with c/o bilateral leg and ankle swelling. daughter also states pt's BP was low.  BP was 78/58. Current BP-90/68  Pt denies any CP, SOB and abdominal pain at this time.   Allergies No Known Allergies  Level of Care/Admitting Diagnosis ED Disposition    ED Disposition Condition Comment   Admit  Hospital Area: [facility] [[id number]]  Level of Care: Telemetry [5]  Covid Evaluation: Asymptomatic Screening Protocol (No Symptoms)  Diagnosis: Acute exacerbation of CHF (congestive heart failure) [id number]  Admitting Physician: Christel Mormon [id number]  Attending Physician: Christel Mormon [id number]  Estimated length of stay: past midnight tomorrow  Certification:: I certify this patient will need inpatient services for at least 2 midnights  PT Class (Do Not Modify): Inpatient [101]  PT Acc Code (Do Not Modify): Private [1]       B Medical/Surgery History Past Medical History:  Diagnosis Date  . Anemia   . Atrial fibrillation ([place])   . COPD (chronic obstructive pulmonary disease) ([place])   . GERD (gastroesophageal reflux disease)   . Glaucoma   . Hyperlipemia   . Hypertension   . Hypothyroidism   . Irregular heart rhythm   . Left anterior fascicular block   . MGUS (monoclonal gammopathy of unknown significance)   . RBBB    Past Surgical History:  Procedure Laterality Date  . Cervical and lumbar disc surgeries    . Cryoablation left renal mass    . PACEMAKER LEADLESS INSERTION N/A 10/16/2018   Procedure: PACEMAKER LEADLESS INSERTION;  Surgeon: Isaias Cowman, MD;  Location: [facility];  Service: Cardiovascular;  Laterality: N/A;  . TEMPORARY PACEMAKER Left 10/16/2018   Procedure: TEMPORARY PACEMAKER;  Surgeon: Isaias Cowman, MD;  Location: [facility];  Service: Cardiovascular;  Laterality: Left;  . TONSILLECTOMY       A IV Location/Drains/Wounds Patient Lines/Drains/Airways Status   Active Line/Drains/Airways    Name:   Placement date:   Placement time:   Site:   Days:   Peripheral IV 04/11/19 Right Hand   04/11/19    1614    Hand   less than 1   External Urinary Catheter   04/11/19    1945    -   less than 1          Intake/Output Last 24 hours No intake or output data in the 24 hours ending 04/11/19 2155  Labs/Imaging Results for orders placed or performed during the hospital encounter of 04/11/19 (from the past 48 hour(s))  Brain natriuretic peptide     Status: Abnormal   Collection Time: 04/11/19  4:15 PM  Result Value Ref Range   B Natriuretic Peptide 2,019.0 (H) 0.0 - 100.0 pg/mL    Comment: Performed at [facility], [address] [place], [place] [address]  CBC with Differential/Platelet     Status: Abnormal   Collection Time: 04/11/19  4:15 PM  Result Value Ref Range   WBC 4.1 4.0 - 10.5 K/uL   RBC 4.12 (L) 4.22 - 5.81 MIL/uL   Hemoglobin 14.5  13.0 - 17.0 g/dL   HCT 44.3 39.0 - 52.0 %   MCV 107.5 (H) 80.0 - 100.0 fL   MCH 35.2 (H) 26.0 - 34.0 pg   MCHC 32.7 30.0 - 36.0 g/dL   RDW 15.1 11.5 - 15.5 %   Platelets 124 (L) 150 - 400 K/uL   nRBC 0.0 0.0 - 0.2 %   Neutrophils Relative % 42 %   Neutro Abs 1.7 1.7 - 7.7 K/uL   Lymphocytes Relative 46 %   Lymphs Abs 1.8 0.7 - 4.0 K/uL   Monocytes Relative 9 %   Monocytes Absolute 0.4 0.1 - 1.0 K/uL   Eosinophils Relative 2 %   Eosinophils Absolute 0.1 0.0 - 0.5 K/uL   Basophils Relative 1 %   Basophils Absolute 0.1 0.0 - 0.1 K/uL   Immature Granulocytes 0 %   Abs Immature Granulocytes 0.01 0.00 - 0.07 K/uL    Comment:  Performed at [facility], [address] [place], [place] [address]  Protime-INR     Status: Abnormal   Collection Time: 04/11/19  4:15 PM  Result Value Ref Range   Prothrombin Time 32.9 (H) 11.4 - 15.2 seconds   INR 3.3 (H) 0.8 - 1.2    Comment: (NOTE) INR goal varies based on device and disease states. Performed at [facility], [address] [place], [place] [address]   T4, free     Status: Abnormal   Collection Time: 04/11/19  5:30 PM  Result Value Ref Range   Free T4 1.59 (H) 0.61 - 1.12 ng/dL    Comment: (NOTE) Biotin ingestion may interfere with free T4 tests. If the results are inconsistent with the TSH level, previous test results, or the clinical presentation, then consider biotin interference. If needed, order repeat testing after stopping biotin. Performed at [facility], [address] [place], [place] [address]   Comprehensive metabolic panel     Status: Abnormal   Collection Time: 04/11/19  6:24 PM  Result Value Ref Range   Sodium 138 135 - 145 mmol/L   Potassium 3.6 3.5 - 5.1 mmol/L   Chloride 103 98 - 111 mmol/L   CO2 23 22 - 32 mmol/L   Glucose, Bld 90 70 - 99 mg/dL   BUN 36 (H) 8 - 23 mg/dL   Creatinine, Ser 1.02 0.61 - 1.24 mg/dL   Calcium 9.0 8.9 - 10.3 mg/dL   Total Protein 7.0 6.5 - 8.1 g/dL   Albumin 3.9 3.5 - 5.0 g/dL   AST 24 15 - 41 U/L   ALT 21 0 - 44 U/L   Alkaline Phosphatase 56 38 - 126 U/L   Total Bilirubin 1.5 (H) 0.3 - 1.2 mg/dL   GFR calc non Af Amer >60 >60 mL/min   GFR calc Af Amer >60 >60 mL/min   Anion gap 12 5 - 15    Comment: Performed at [facility], [place], [place] [address]  Troponin I (High Sensitivity)     Status: Abnormal   Collection Time: 04/11/19  6:24 PM  Result Value Ref Range   Troponin I (High Sensitivity) 22 (H) <18 ng/L    Comment: (NOTE) Elevated high sensitivity troponin I (hsTnI) values and significant  changes across serial measurements may  suggest ACS but many other  chronic and acute conditions are known to elevate hsTnI results.  Refer to the "Links" section for chest pain algorithms and additional  guidance. Performed at [facility], [phone] [place], [place] [address]   TSH     Status: Abnormal   Collection Time: 04/11/19  6:24 PM  Result Value Ref Range   TSH 16.067 (H) 0.350 - 4.500 uIU/mL    Comment: Performed by a 3rd Generation assay with a functional sensitivity of <=0.01 uIU/mL. Performed at [facility], [address] [place], [place] [address]   Urinalysis, Complete w Microscopic     Status: Abnormal   Collection Time: 04/11/19  9:16 PM  Result Value Ref Range   Color, Urine YELLOW (A) YELLOW   APPearance CLEAR (A) CLEAR   Specific Gravity, Urine 1.011 1.005 - 1.030   pH 5.0 5.0 - 8.0   Glucose, UA NEGATIVE NEGATIVE mg/dL   Hgb urine dipstick NEGATIVE NEGATIVE   Bilirubin Urine NEGATIVE NEGATIVE   Ketones, ur NEGATIVE NEGATIVE mg/dL   Protein, ur NEGATIVE NEGATIVE mg/dL   Nitrite NEGATIVE NEGATIVE   Leukocytes,Ua NEGATIVE NEGATIVE   RBC / HPF 0-5 0 - 5 RBC/hpf   WBC, UA NONE SEEN 0 - 5 WBC/hpf   Bacteria, UA NONE SEEN NONE SEEN   Squamous Epithelial / LPF NONE SEEN 0 - 5   Mucus PRESENT    Hyaline Casts, UA PRESENT     Comment: Performed at [facility], [place], [place] [address]  Troponin I (High Sensitivity)     Status: Abnormal   Collection Time: 04/11/19  9:20 PM  Result Value Ref Range   Troponin I (High Sensitivity) 21 (H) <18 ng/L    Comment: (NOTE) Elevated high sensitivity troponin I (hsTnI) values and significant  changes across serial measurements may suggest ACS but many other  chronic and acute conditions are known to elevate hsTnI results.  Refer to the "Links" section for chest pain algorithms and additional  guidance. Performed at [facility], [address] [place], [place] [address]    Dg Chest Port 1  View  Result Date: 04/11/2019 CLINICAL DATA:  Leg and ankle swelling, low blood pressure EXAM: PORTABLE CHEST 1 VIEW COMPARISON:  10/13/2018 FINDINGS: Gross cardiomegaly. Subtle heterogeneous opacity of the right lung base with a probable small layering pleural effusion. The visualized skeletal structures are unremarkable. IMPRESSION: Gross cardiomegaly. Subtle heterogeneous opacity of the right lung base with a probable small layering pleural effusion, concerning for infection or aspiration. Electronically Signed   By: Eddie Candle M.D.   On: 04/11/2019 16:59    Pending Labs Unresulted Labs (From admission, onward)    Start     Ordered   04/12/19 [id number]  Basic metabolic panel  Daily,   STAT     04/11/19 2144   04/11/19 1740  T3, free  Once,   R     04/11/19 1740   04/11/19 1657  SARS CORONAVIRUS 2 (TAT 6-12 HRS) Nasal Swab Aptima Multi Swab  (Asymptomatic/Tier 2 Patients Labs)  ONCE - STAT,   STAT    Question Answer Comment  Is this test for diagnosis or screening Screening   Symptomatic for COVID-19 as defined by CDC No   Hospitalized for COVID-19 No   Admitted to ICU for COVID-19 No   Previously tested for COVID-19 No   Resident in a congregate (group) care setting No   Employed in healthcare setting No      04/11/19 1656          Vitals/Pain Today's Vitals   04/11/19 1630 04/11/19 1800 04/11/19 1930 04/11/19 2030  BP: 103/74 104/72 109/74  111/75  Pulse:    89  Resp: (!) 25 18 14 13   SpO2:    100%  Weight:      Height:      PainSc:        Isolation Precautions No active isolations  Medications Medications  albuterol (VENTOLIN HFA) 108 (90 Base) MCG/ACT inhaler 2 puff (has no administration in time range)  levothyroxine (SYNTHROID) tablet 75 mcg (has no administration in time range)  multivitamin-iron-minerals-folic acid (CENTRUM) chewable tablet 1 tablet (has no administration in time range)  potassium chloride (K-DUR) CR tablet 10 mEq (has no administration in time  range)  latanoprost (XALATAN) 0.005 % ophthalmic solution 1 drop (has no administration in time range)  warfarin (COUMADIN) tablet 6.5 mg (has no administration in time range)  sodium chloride flush (NS) 0.9 % injection 3 mL (has no administration in time range)  sodium chloride flush (NS) 0.9 % injection 3 mL (has no administration in time range)  0.9 %  sodium chloride infusion (has no administration in time range)  acetaminophen (TYLENOL) tablet 650 mg (has no administration in time range)  ondansetron (ZOFRAN) injection 4 mg (has no administration in time range)  furosemide (LASIX) injection 40 mg (has no administration in time range)  furosemide (LASIX) injection 40 mg (40 mg Intravenous Given 04/11/19 1939)    Mobility walks with person assist Low fall risk   Focused Assessments Cardiac Assessment Handoff:    Lab Results  Component Value Date   TROPONINI 0.04 (HH) 10/13/2018   No results found for: DDIMER Does the Patient currently have chest pain? No     R Recommendations: See Admitting Provider Note  Report given to:   Additional Notes:

## 2019-04-11 NOTE — Progress Notes (Signed)
ANTICOAGULATION CONSULT NOTE - Initial Consult  Pharmacy Consult for warfarin dosing/monitoring Indication: atrial fibrillation  No Known Allergies  Patient Measurements: Height: 5\' 6"  (167.6 cm) Weight: 169 lb (76.7 kg) IBW/kg (Calculated) : 63.8  Vital Signs: Temp: 98.1 F (36.7 C) (08/26 2259) Temp Source: Oral (08/26 2259) BP: 102/73 (08/26 2259) Pulse Rate: 72 (08/26 2259)  Labs: Recent Labs    04/11/19 1615 04/11/19 1824 04/11/19 2120  HGB 14.5  --   --   HCT 44.3  --   --   PLT 124*  --   --   LABPROT 32.9*  --   --   INR 3.3*  --   --   CREATININE  --  1.02  --   TROPONINIHS  --  22* 21*    Estimated Creatinine Clearance: 47.9 mL/min (by C-G formula based on SCr of 1.02 mg/dL).   Medical History: Past Medical History:  Diagnosis Date  . Anemia   . Atrial fibrillation ([place])   . COPD (chronic obstructive pulmonary disease) ([place])   . GERD (gastroesophageal reflux disease)   . Glaucoma   . Hyperlipemia   . Hypertension   . Hypothyroidism   . Irregular heart rhythm   . Left anterior fascicular block   . MGUS (monoclonal gammopathy of unknown significance)   . RBBB     Medications:  Scheduled:  . furosemide  40 mg Intravenous Q12H  . latanoprost  1 drop Both Eyes QHS  . [START ON 04/12/2019] levothyroxine  88 mcg Oral Q0600  . [START ON 04/12/2019] multivitamin with minerals  1 tablet Oral Daily  . [START ON 04/12/2019] potassium chloride SA  10 mEq Oral Daily  . sodium chloride flush  3 mL Intravenous Q12H    Assessment: Patient admitted x hypotension and leg swelling w/ h/o afib s/p PM w/ RBBB and EKG showing accelerated rhythm w/ PVCs, DM, hypothyroidism w/ TSH of 16 08/26, w/ worsening bilateral LE edema w/ hypotension leading to his losartan and hctz being discontinued w/ worsening leg swelling. Patient is anticoagulated on warfarin 6.5 mg daily PTA for afib.  08/26 INR 3.3 supratherapeutic  Goal of Therapy:  INR 2-3 Monitor platelets by  anticoagulation protocol: Yes   Plan:  Will hold off on warfarin doses for now until INR can reach < 3. Once patient < 3 will consider restarting at a lower dose and dosing per INR trends. Will monitor daily INRs and adjust dose based on INR trends.  Tobie Lords, PharmD, BCPS Clinical Pharmacist 04/11/2019,11:03 PM

## 2019-04-11 NOTE — ED Notes (Signed)
Meal tray and water given to pt by this EDT

## 2019-04-12 ENCOUNTER — Inpatient Hospital Stay
Admit: 2019-04-12 | Discharge: 2019-04-12 | Disposition: A | Discharge status: Home / Self Care | Payer: Medicare HMO | Attending: Nurse Practitioner | Admitting: Nurse Practitioner

## 2019-04-12 LAB — BASIC METABOLIC PANEL
Anion gap: 10 (ref 5–15)
BUN: 35 mg/dL — ABNORMAL HIGH (ref 8–23)
CO2: 27 mmol/L (ref 22–32)
Calcium: 9.1 mg/dL (ref 8.9–10.3)
Chloride: 103 mmol/L (ref 98–111)
Creatinine, Ser: 1.1 mg/dL (ref 0.61–1.24)
GFR calc Af Amer: >60 mL/min (ref >60)
GFR calc non Af Amer: 59 mL/min — ABNORMAL LOW (ref >60)
Glucose, Bld: 99 mg/dL (ref 70–99)
Potassium: 3.9 mmol/L (ref 3.5–5.1)
Sodium: 140 mmol/L (ref 135–145)

## 2019-04-12 LAB — SARS CORONAVIRUS 2 (TAT 6-24 HRS): SARS Coronavirus 2: NEGATIVE

## 2019-04-12 LAB — PROTIME-INR
INR: 2.9 — ABNORMAL HIGH (ref 0.8–1.2)
Prothrombin Time: 29.8 seconds — ABNORMAL HIGH (ref 11.4–15.2)

## 2019-04-12 LAB — ECHOCARDIOGRAM COMPLETE
Height: 66 in
Weight: 2692.8 oz

## 2019-04-12 MED ORDER — IPRATROPIUM-ALBUTEROL 0.5-2.5 (3) MG/3ML IN SOLN
3.0000 mL | Freq: Three times a day (TID) | RESPIRATORY_TRACT | Status: DC
  Administered 2019-04-13 – 2019-04-14 (×5): 3 mL via RESPIRATORY_TRACT
  Filled 2019-04-12 (×6): qty 3

## 2019-04-12 MED ORDER — WARFARIN - PHARMACIST DOSING INPATIENT
Freq: Every day | Status: DC
  Administered 2019-04-12 – 2019-04-19 (×7)

## 2019-04-12 MED ORDER — IPRATROPIUM-ALBUTEROL 0.5-2.5 (3) MG/3ML IN SOLN: 3.0000 mL | Freq: Four times a day (QID) | RESPIRATORY_TRACT | Status: DC

## 2019-04-12 MED ORDER — ENSURE ENLIVE PO LIQD
237.0000 mL | Freq: Two times a day (BID) | ORAL | Status: DC
  Administered 2019-04-14 – 2019-05-04 (×29): 237 mL via ORAL

## 2019-04-12 MED ORDER — WARFARIN SODIUM 2 MG PO TABS
2.0000 mg | ORAL_TABLET | Freq: Once | ORAL | Status: AC
  Administered 2019-04-12: 17:00:00 2 mg via ORAL
  Filled 2019-04-12: qty 1

## 2019-04-12 MED ORDER — IPRATROPIUM-ALBUTEROL 0.5-2.5 (3) MG/3ML IN SOLN
3.0000 mL | Freq: Four times a day (QID) | RESPIRATORY_TRACT | Status: DC
  Administered 2019-04-12 (×3): 3 mL via RESPIRATORY_TRACT
  Filled 2019-04-12 (×3): qty 3

## 2019-04-12 NOTE — Progress Notes (Signed)
[facility] NAME: Meade Edd    MR#:  [id number]  DATE OF BIRTH:  12-24-1927  SUBJECTIVE:  CHIEF COMPLAINT:   Chief Complaint  Patient presents with  . Hypotension  . Leg Swelling  Patient seen and evaluated today Has swelling in the lower extremities Blood pressure is better Difficulty breathing is better  REVIEW OF SYSTEMS:    ROS  CONSTITUTIONAL: No documented fever. No fatigue, weakness. No weight gain, no weight loss.  EYES: No blurry or double vision.  ENT: No tinnitus. No postnasal drip. No redness of the oropharynx.  RESPIRATORY: No cough, no wheeze, no hemoptysis. Has dyspnea.  CARDIOVASCULAR: No chest pain. Has orthopnea. No palpitations. No syncope.  GASTROINTESTINAL: No nausea, no vomiting or diarrhea. No abdominal pain. No melena or hematochezia.  GENITOURINARY: No dysuria or hematuria.  ENDOCRINE: No polyuria or nocturia. No heat or cold intolerance.  HEMATOLOGY: No anemia. No bruising. No bleeding.  INTEGUMENTARY: No rashes. No lesions.  MUSCULOSKELETAL: No arthritis. Has swelling. No gout.  NEUROLOGIC: No numbness, tingling, or ataxia. No seizure-type activity.  PSYCHIATRIC: No anxiety. No insomnia. No ADD.   DRUG ALLERGIES:  No Known Allergies  VITALS:  Blood pressure 108/73, pulse 69, temperature 98.4 F (36.9 C), resp. rate 18, height 5\' 6"  (1.676 m), weight 76.3 kg, SpO2 100 %.  PHYSICAL EXAMINATION:   Physical Exam  GENERAL:  85 y.o.-year-old patient lying in the bed with no acute distress.  EYES: Pupils equal, round, reactive to light and accommodation. No scleral icterus. Extraocular muscles intact.  HEENT: Head atraumatic, normocephalic. Oropharynx and nasopharynx clear.  NECK:  Supple, no jugular venous distention. No thyroid enlargement, no tenderness.  LUNGS: Decreased breath sounds bilaterally, bibasilar crepitations heard. No use of accessory muscles of respiration.  CARDIOVASCULAR:  S1, S2 normal. No murmurs, rubs, or gallops.  ABDOMEN: Soft, nontender, nondistended. Bowel sounds present. No organomegaly or mass.  EXTREMITIES: No cyanosis, clubbing  has edema b/l.    NEUROLOGIC: Cranial nerves II through XII are intact. No focal Motor or sensory deficits b/l.   PSYCHIATRIC: The patient is alert and oriented x 3.  SKIN: No obvious rash, lesion, or ulcer.   LABORATORY PANEL:   CBC Recent Labs  Lab 04/11/19 1615  WBC 4.1  HGB 14.5  HCT 44.3  PLT 124*   ------------------------------------------------------------------------------------------------------------------ Chemistries  Recent Labs  Lab 04/11/19 1824 04/12/19 0504  NA 138 140  K 3.6 3.9  CL 103 103  CO2 23 27  GLUCOSE 90 99  BUN 36* 35*  CREATININE 1.02 1.10  CALCIUM 9.0 9.1  AST 24  --   ALT 21  --   ALKPHOS 56  --   BILITOT 1.5*  --    ------------------------------------------------------------------------------------------------------------------  Cardiac Enzymes No results for input(s): TROPONINI in the last 168 hours. ------------------------------------------------------------------------------------------------------------------  RADIOLOGY:  Dg Chest Port 1 View  Result Date: 04/11/2019 CLINICAL DATA:  Leg and ankle swelling, low blood pressure EXAM: PORTABLE CHEST 1 VIEW COMPARISON:  10/13/2018 FINDINGS: Gross cardiomegaly. Subtle heterogeneous opacity of the right lung base with a probable small layering pleural effusion. The visualized skeletal structures are unremarkable. IMPRESSION: Gross cardiomegaly. Subtle heterogeneous opacity of the right lung base with a probable small layering pleural effusion, concerning for infection or aspiration. Electronically Signed   By: Eddie Candle M.D.   On: 04/11/2019 16:59     ASSESSMENT AND PLAN:  85 year old male patient with history of chronic atrial fibrillation, COPD, GERD,  hyperlipidemia, hypertension, monoclonal gammopathy,  hypothyroidism currently under hospitalist service  -New onset congestive heart failure Diurese patient with IV Lasix Check echocardiogram Monitor electrolytes Cardiology consult Daily body weights and input / output chart  -Chronic atrial fibrillation Continue anticoagulation with Coumadin Rate well controlled  -COPD appears stable Home dose inhalers  -DVT prophylaxis On Coumadin  All the records are reviewed and case discussed with Care Management/Social Worker. Management plans discussed with the patient, family and they are in agreement.  CODE STATUS:Full code  DVT Prophylaxis: SCDs  TOTAL TIME TAKING CARE OF THIS PATIENT: 37 minutes.   POSSIBLE D/C IN 2 to 3 DAYS, DEPENDING ON CLINICAL CONDITION.  Saundra Shelling M.D on 04/12/2019 at 12:27 PM  Between 7am to 6pm - Pager - [phone]  After 6pm go to [email or web address] - password [email or web address] [facility]  Office  [phone]  CC: Primary care physician; Marinda Elk, MD  Note: This dictation was prepared with Dragon dictation along with smaller phrase technology. Any transcriptional errors that result from this process are unintentional.

## 2019-04-12 NOTE — Progress Notes (Signed)
Advanced care plan. Purpose of the Encounter: CODE STATUS Parties in Attendance: Patient Patient's Decision Capacity: Good Subjective/Patient's story: Gabriel Delacruz  is a 85 y.o. male with a known history of atrial fibrillation, COPD, GERD, hyperlipidemia, hypertension, hypothyroidism, monoclonal gammopathy.  Historically TSH was 105 on 03/21/2019.  He presented to the emergency room complaining of a one-week history of increasing bilateral lower extremity edema.  Patient reports edema at baseline however it has been becoming worse over the last week and is now resulting in difficulty with ambulation due to the associated discomfort.  Additionally, patient has been experiencing hypotension as low as 70s over 50s according to the patient with his home health care nurse reading.  He was recently seen by Dr. Ubaldo Glassing, his cardiologist, on 04/09/2019.  Losartan and hydrochlorothiazide was discontinued and Lasix was increased at this time.  Despite compliance with this, he has continued to have increased edema of his lower extremities.He denies fevers, chills, nausea, vomiting, diarrhea.  He denies abdominal pain.  He denies chest pain.  He denies increased shortness of breath other than with ambulation.BNP is 2019.  Rapid COVID-19 testing is negative.  TSH is 16.067 with free T4 1.59.  Chest x-ray demonstrates subtle heterogenous opacity of the right lung base with a probable small layering pleural effusion.  Echocardiogram 10/14/2018 demonstrates 60 to 65% ejection fraction. Objective/Medical story Patient needs cardiology evaluation, needs diuresis with Lasix Needs work-up with echocardiogram Goals of care determination:  Advance care directives goals of care treatment plan discussed For now patient wants everything done which includes CPR, intubation ventilator if the need arises CODE STATUS: Full code Time spent discussing advanced care planning: 16 minutes

## 2019-04-12 NOTE — Progress Notes (Signed)
Appreciate Tasha's help regarding patient CHF education. She states she started some CHF videos, checked to see if the dietician had been consulted, and quizzed patient on CHF info. She also stated patient was very sleepy and may not have finished knowledge quiz. Per CHF nurse Diane, she will attempt to see and assess his knowledge today as well. Daughter remains at the bedside. Will continue to monitor educational goals. Wenda Low [facility]

## 2019-04-12 NOTE — Plan of Care (Signed)
  Problem: Education: Goal: Knowledge of General Education information will improve Description: Including pain rating scale, medication(s)/side effects and non-pharmacologic comfort measures Outcome: Progressing   Problem: Health Behavior/Discharge Planning: Goal: Ability to manage health-related needs will improve Outcome: Progressing   Problem: Clinical Measurements: Goal: Ability to maintain clinical measurements within normal limits will improve Outcome: Not Progressing Note: B.N.P. on admission was over 2,000. IV Lasix has been administered this AM. CHF care plan already initiated. Will continue to monitor lab values. Wenda Low [facility]

## 2019-04-12 NOTE — Progress Notes (Signed)
ANTICOAGULATION CONSULT NOTE - Initial Consult  Pharmacy Consult for warfarin dosing/monitoring Indication: atrial fibrillation  No Known Allergies  Patient Measurements: Height: 5\' 6"  (167.6 cm) Weight: 168 lb 4.8 oz (76.3 kg) IBW/kg (Calculated) : 63.8  Vital Signs: Temp: 97.6 F (36.4 C) (08/27 0344) Temp Source: Oral (08/27 0344) BP: 105/74 (08/27 0344) Pulse Rate: 70 (08/27 0344)  Labs: Recent Labs    04/11/19 1615 04/11/19 1824 04/11/19 2120 04/12/19 0504  HGB 14.5  --   --   --   HCT 44.3  --   --   --   PLT 124*  --   --   --   LABPROT 32.9*  --   --  29.8*  INR 3.3*  --   --  2.9*  CREATININE  --  1.02  --  1.10  TROPONINIHS  --  22* 21*  --     Estimated Creatinine Clearance: 41.1 mL/min (by C-G formula based on SCr of 1.1 mg/dL).   Medical History: Past Medical History:  Diagnosis Date  . Anemia   . Atrial fibrillation ([place])   . COPD (chronic obstructive pulmonary disease) ([place])   . GERD (gastroesophageal reflux disease)   . Glaucoma   . Hyperlipemia   . Hypertension   . Hypothyroidism   . Irregular heart rhythm   . Left anterior fascicular block   . MGUS (monoclonal gammopathy of unknown significance)   . RBBB     Medications:  Scheduled:  . furosemide  40 mg Intravenous Q12H  . ipratropium-albuterol  3 mL Nebulization Q6H  . latanoprost  1 drop Both Eyes QHS  . levothyroxine  88 mcg Oral Q0600  . multivitamin with minerals  1 tablet Oral Daily  . potassium chloride SA  10 mEq Oral Daily  . sodium chloride flush  3 mL Intravenous Q12H    Assessment: Patient admitted x hypotension and leg swelling w/ h/o afib s/p PM w/ RBBB and EKG showing accelerated rhythm w/ PVCs, DM, hypothyroidism w/ TSH of 16 08/26, w/ worsening bilateral LE edema w/ hypotension leading to his losartan and hctz being discontinued w/ worsening leg swelling. Patient is anticoagulated on warfarin 6.5 mg daily PTA for afib.  08/26 INR 3.3 supratherapeutic 08/27 INR  2.9 therapeutic   Date INR Warfarin Dose  8/26 3.3 None  8/27 2.9 2 mg    Goal of Therapy:  INR 2-3 Monitor platelets by anticoagulation protocol: Yes   Plan:  INR is therapeutic. Will give a low dose tonight (2 mg) and follow the INR trend. Daily INR ordered.   Eleonore Chiquito, PharmD, BCPS Clinical Pharmacist 04/12/2019,7:27 AM

## 2019-04-12 NOTE — Progress Notes (Signed)
Initial Nutrition Assessment  RD working remotely.  DOCUMENTATION CODES:   Not applicable  INTERVENTION:  Provide Ensure Enlive po BID, each supplement provides 350 kcal and 20 grams of protein.  Patient not appropriate for education at this time. Will continue to monitor and try to follow-up for education prior to discharge if he becomes more appropriate. Patient also with advanced age and at risk for malnutrition.  NUTRITION DIAGNOSIS:   Inadequate oral intake related to decreased appetite as evidenced by per patient/family report.  GOAL:   Patient will meet greater than or equal to 90% of their needs  MONITOR:   PO intake, Supplement acceptance, Labs, Weight trends, I & O's  REASON FOR ASSESSMENT:   Malnutrition Screening Tool, Consult Diet education  ASSESSMENT:   85 year old male with PMHx of glaucoma, HTN, A-fib, GERD, hypothyroidism, COPD admitted with new onset CHF.   Spoke with patient over the phone. He seemed drowsy/confused and was not able to provide a very thorough history. He reports his appetite is decreased but he is unsure how long it has been decreased. He reports he is eating <50% of his meals. Patient continuously asking RD if she is going fishing now or if she is going to [employer]. Patient is not appropriate for education at this time. Will continue to monitor.  Patient's weight appears to be trending down over time. He was 88 kg in 2016, 85.7 kg on 06/27/2017, and is now 76.3 kg (168.3 lbs).  Medications reviewed and include: Lasix 40 mg Q12hrs IV, levothyroxine, MVI daily, potassium chloride 10 mEq daily, Warfarin.  Labs reviewed.  Patient is at risk for malnutrition. Unable to determine if he meets criteria without full history or NFPE.  NUTRITION - FOCUSED PHYSICAL EXAM:  Unable to complete at this time.  Diet Order:   Diet Order            Diet 2 gram sodium Room service appropriate? Yes; Fluid consistency: Thin  Diet effective now              EDUCATION NEEDS:   Not appropriate for education at this time  Skin:  Skin Assessment: Reviewed RN Assessment  Last BM:  04/11/2019 per chart  Height:   Ht Readings from Last 1 Encounters:  04/11/19 5\' 6"  (1.676 m)   Weight:   Wt Readings from Last 1 Encounters:  04/12/19 76.3 kg   Ideal Body Weight:  64.5 kg  BMI:  Body mass index is 27.16 kg/m.  Estimated Nutritional Needs:   Kcal:  1700-1900  Protein:  85-95 grams  Fluid:  1.8 L/day  Willey Blade, MS, RD, LDN Office: [phone] Pager: [phone] After Hours/Weekend Pager: [phone]

## 2019-04-12 NOTE — TOC Initial Note (Signed)
Transition of Care [facility]) - Initial/Assessment Note    Patient Details  Name: Gabriel Delacruz MRN: [id number] Date of Birth: 1927/12/26  [facility]) CM/SW Contact:    Shade Flood, LCSW Phone Number: 04/12/2019, 3:42 PM  Clinical Narrative:                  CM consulted for HF [facility] screen. Met with pt and his daughter today to assess. Pt lives alone but has needed increasing family support over the last few months. Pt was at [facility] after having a pacemaker placed several months ago. When he returned home, [place] stay at home guidance was in effect and pt has been isolated at home. Daughter feels pt has been depressed. This has been address by pt's PCP. Daughter lives in [place]. Pt has one son in [place] and one son in [place].  Pt uses a walker at home. He does not drive. [facility] has been following for RN and PT. Dtr states that pt has help getting to appointments and that he obtains medication without difficulty.   Pt's daughter feels pt should not live alone. She is hoping pt can return to [facility] for rehab and then maybe arrange for ALF. Provided list of local SNF and ALF facilities at daughter request. Awaiting PT consult at this time. Pt and dtr interested in [facility] or [facility] if SNF rehab needed.  Will await PT recommendations and follow for TOC needs.  Expected Discharge Plan: Skilled Nursing Facility Barriers to Discharge: Continued Medical Work up   Patient Goals and CMS Choice Patient states their goals for this hospitalization and ongoing recovery are:: Return home CMS Medicare.gov Compare Post Acute Care list provided to:: Patient Represenative (must comment) Choice offered to / list presented to : Adult Children  Expected Discharge Plan and Services Expected Discharge Plan: [place] In-house Referral: Clinical Social Work     Living arrangements for the past 2 months: Single Family Home                                       Prior Living Arrangements/Services Living arrangements for the past 2 months: Single Family Home Lives with:: Self Patient language and need for interpreter reviewed:: Yes Do you feel safe going back to the place where you live?: Yes      Need for Family Participation in Patient Care: Yes (Comment) Care giver support system in place?: Yes (comment) Current home services: Home PT, Home RN Criminal Activity/Legal Involvement Pertinent to Current Situation/Hospitalization: No - Comment as needed  Activities of Daily Living Home Assistive Devices/Equipment: Blood pressure cuff, Cane (specify quad or straight) ADL Screening (condition at time of admission) Patient's cognitive ability adequate to safely complete daily activities?: Yes Is the patient deaf or have difficulty hearing?: No Does the patient have difficulty seeing, even when wearing glasses/contacts?: No Does the patient have difficulty concentrating, remembering, or making decisions?: No Patient able to express need for assistance with ADLs?: Yes Does the patient have difficulty dressing or bathing?: No Independently performs ADLs?: Yes (appropriate for developmental age) Does the patient have difficulty walking or climbing stairs?: Yes Weakness of Legs: Left Weakness of Arms/Hands: None  Permission Sought/Granted                  Emotional Assessment Appearance:: Appears stated age Attitude/Demeanor/Rapport: Engaged Affect (typically observed): Pleasant Orientation: :  Oriented to Self, Oriented to Place, Oriented to  Time, Oriented to Situation Alcohol / Substance Use: Not Applicable Psych Involvement: No (comment)  Admission diagnosis:  Leg edema [R60.0] SOB (shortness of breath) [R06.02] Leg swelling [M79.89] Patient Active Problem List   Diagnosis Date Noted  . Acute exacerbation of CHF (congestive heart failure) ([place]) 04/11/2019  . Severe sinus bradycardia 10/13/2018  . MGUS (monoclonal gammopathy of  unknown significance) 12/11/2015   PCP:  Marinda Elk, MD Pharmacy:   [place], [place] [address] Phone: [phone] Fax: [phone]     Social Determinants of Health (SDOH) Interventions    Readmission Risk Interventions Readmission Risk Prevention Plan 04/12/2019  Medication Screening Complete  Transportation Screening Complete  Some recent data might be hidden

## 2019-04-12 NOTE — Progress Notes (Signed)
*  PRELIMINARY RESULTS* Echocardiogram 2D Echocardiogram has been performed.  Gabriel Delacruz 04/12/2019, 10:29 AM

## 2019-04-13 LAB — BASIC METABOLIC PANEL
Anion gap: 9 (ref 5–15)
BUN: 29 mg/dL — ABNORMAL HIGH (ref 8–23)
CO2: 28 mmol/L (ref 22–32)
Calcium: 8.6 mg/dL — ABNORMAL LOW (ref 8.9–10.3)
Chloride: 101 mmol/L (ref 98–111)
Creatinine, Ser: 1.12 mg/dL (ref 0.61–1.24)
GFR calc Af Amer: >60 mL/min (ref >60)
GFR calc non Af Amer: 58 mL/min — ABNORMAL LOW (ref >60)
Glucose, Bld: 95 mg/dL (ref 70–99)
Potassium: 4 mmol/L (ref 3.5–5.1)
Sodium: 138 mmol/L (ref 135–145)

## 2019-04-13 LAB — PROTIME-INR
INR: 3.2 — ABNORMAL HIGH (ref 0.8–1.2)
Prothrombin Time: 32 seconds — ABNORMAL HIGH (ref 11.4–15.2)

## 2019-04-13 LAB — T3, FREE: T3, Free: 1.7 pg/mL — ABNORMAL LOW (ref 2.0–4.4)

## 2019-04-13 MED ORDER — DOCUSATE SODIUM 100 MG PO CAPS
100.0000 mg | ORAL_CAPSULE | Freq: Two times a day (BID) | ORAL | Status: DC
  Administered 2019-04-13 – 2019-05-04 (×36): 100 mg via ORAL
  Filled 2019-04-13 (×39): qty 1

## 2019-04-13 MED ORDER — FUROSEMIDE 10 MG/ML IJ SOLN
80.0000 mg | Freq: Two times a day (BID) | INTRAMUSCULAR | Status: DC
  Administered 2019-04-14: 80 mg via INTRAVENOUS
  Filled 2019-04-13: qty 8

## 2019-04-13 NOTE — TOC Progression Note (Addendum)
Transition of Care [facility]) - Progression Note    Patient Details  Name: Gabriel Delacruz MRN: [id number] Date of Birth: 1928/06/18  Transition of Care [facility]) CM/SW Contact  Ross Ludwig, [place] Phone Number: 04/13/2019, 6:49 PM  Clinical Narrative:     CSW spoke to patient and his daughter, they would like CSW to pursue SNF placement.  Patient is regular [employer], so insurance will have to be approved.  Patient and his daughter prefer [facility] if possible, awaiting for bed offers.   Expected Discharge Plan: [place] Barriers to Discharge: Continued Medical Work up  Expected Discharge Plan and Services Expected Discharge Plan: [place] In-house Referral: Clinical Social Work     Living arrangements for the past 2 months: Single Family Home                                       Social Determinants of Health (SDOH) Interventions    Readmission Risk Interventions Readmission Risk Prevention Plan 04/12/2019  Medication Screening Complete  Transportation Screening Complete  Some recent data might be hidden

## 2019-04-13 NOTE — Progress Notes (Signed)
ANTICOAGULATION CONSULT NOTE - Initial Consult  Pharmacy Consult for warfarin dosing/monitoring Indication: atrial fibrillation  No Known Allergies  Patient Measurements: Height: 5\' 6"  (167.6 cm) Weight: 169 lb 12.1 oz (77 kg) IBW/kg (Calculated) : 63.8  Vital Signs: Temp: 97.7 F (36.5 C) (08/28 0314) Temp Source: Oral (08/28 0314) BP: 101/65 (08/28 0314) Pulse Rate: 71 (08/28 0314)  Labs: Recent Labs    04/11/19 1615 04/11/19 1824 04/11/19 2120 04/12/19 0504 04/13/19 0634  HGB 14.5  --   --   --   --   HCT 44.3  --   --   --   --   PLT 124*  --   --   --   --   LABPROT 32.9*  --   --  29.8* 32.0*  INR 3.3*  --   --  2.9* 3.2*  CREATININE  --  1.02  --  1.10 1.12  TROPONINIHS  --  22* 21*  --   --     Estimated Creatinine Clearance: 43.7 mL/min (by C-G formula based on SCr of 1.12 mg/dL).   Medical History: Past Medical History:  Diagnosis Date  . Anemia   . Atrial fibrillation ([place])   . COPD (chronic obstructive pulmonary disease) ([place])   . GERD (gastroesophageal reflux disease)   . Glaucoma   . Hyperlipemia   . Hypertension   . Hypothyroidism   . Irregular heart rhythm   . Left anterior fascicular block   . MGUS (monoclonal gammopathy of unknown significance)   . RBBB     Medications:  Scheduled:  . feeding supplement (ENSURE ENLIVE)  237 mL Oral BID BM  . furosemide  40 mg Intravenous Q12H  . ipratropium-albuterol  3 mL Nebulization TID  . latanoprost  1 drop Both Eyes QHS  . levothyroxine  88 mcg Oral Q0600  . multivitamin with minerals  1 tablet Oral Daily  . potassium chloride SA  10 mEq Oral Daily  . sodium chloride flush  3 mL Intravenous Q12H  . Warfarin - Pharmacist Dosing Inpatient   Does not apply q1800    Assessment: Patient admitted x hypotension and leg swelling w/ h/o afib s/p PM w/ RBBB and EKG showing accelerated rhythm w/ PVCs, DM, hypothyroidism w/ TSH of 16 08/26, w/ worsening bilateral LE edema w/ hypotension leading to his  losartan and hctz being discontinued w/ worsening leg swelling. Patient is anticoagulated on warfarin 6.5 mg daily PTA for afib. No major DDI. Pt has HFrEF which can increase INR.    Date INR Warfarin Dose  8/26 3.3 None  8/27 2.9 2 mg  8/27 3.2 Hold    Goal of Therapy:  INR 2-3 Monitor platelets by anticoagulation protocol: Yes   Plan:  INR is supratherapeutic. Will hold warfarin tonight. Daily INR ordered. CBC ordered.    Eleonore Chiquito, PharmD, BCPS Clinical Pharmacist 04/13/2019,8:32 AM

## 2019-04-13 NOTE — Consult Note (Addendum)
[facility] Cardiology  CARDIOLOGY CONSULT NOTE  Patient ID: Gabriel Delacruz MRN: [id number] DOB/AGE: 1927-10-08 85 y.o.  Admit date: 04/11/2019 Referring Physician Dr. Estanislado Pandy Primary Physician Paulita Cradle Primary Cardiologist Dr. Ubaldo Glassing Reason for Consultation CHF  HPI:  Gabriel Delacruz is a 85 y.o. with a past medical history of permanent atrial fibrillation, anticoagulated on warfarin, symptomatic bradycardia s/p leadless pacemaker placement, HTN, HLD, and hypothyroidism, who presented to the ED on 04/11/2019 for two weeks of progressive leg swelling despite use of PO lasix 40 mg daily.   The patient was seen by his cardiologist, Dr. Ubaldo Glassing on 8/24, for his leg swelling and hypotension. His losartan and hydrochlorothiazide were discontinued. Lasix dose was also increased from 20 to 40 mg daily. Despite these changes, swelling continued to progress prompting ER evaluation. Of note, his TSH was 105 on 03/21/2019  ED evaluation suggestive of new onset heart failure given significant BNP elevation to >2000. He also had heterogenous opacity of the right lung base with a probable small layering pleural effusion on CXR. Patient has been on IV lasix 40 mg BID but continues to have leg swelling.   He denies any shortness of breath, orthopnea, or PND. He had no known history of heart failure prior to this admission - previous ECHO completed 10/14/2018 showed EF of 60 to 65%.  Review of systems complete and found to be negative unless listed above   Past Medical History:  Diagnosis Date  . Anemia   . Atrial fibrillation ([place])   . COPD (chronic obstructive pulmonary disease) ([place])   . GERD (gastroesophageal reflux disease)   . Glaucoma   . Hyperlipemia   . Hypertension   . Hypothyroidism   . Irregular heart rhythm   . Left anterior fascicular block   . MGUS (monoclonal gammopathy of unknown significance)   . RBBB     Past Surgical History:  Procedure Laterality Date  . Cervical and  lumbar disc surgeries    . Cryoablation left renal mass    . PACEMAKER LEADLESS INSERTION N/A 10/16/2018   Procedure: PACEMAKER LEADLESS INSERTION;  Surgeon: Isaias Cowman, MD;  Location: [place] CV LAB;  Service: Cardiovascular;  Laterality: N/A;  . TEMPORARY PACEMAKER Left 10/16/2018   Procedure: TEMPORARY PACEMAKER;  Surgeon: Isaias Cowman, MD;  Location: [place] CV LAB;  Service: Cardiovascular;  Laterality: Left;  . TONSILLECTOMY      Medications Prior to Admission  Medication Sig Dispense Refill Last Dose  . albuterol (PROAIR HFA) 108 (90 BASE) MCG/ACT inhaler Inhale 2 puffs into the lungs every 6 (six) hours as needed.    prn at prn  . dorzolamide-timolol (COSOPT) 22.3-6.8 MG/ML ophthalmic solution Place 1 drop into both eyes 2 (two) times daily.    04/11/2019 at Unknown time  . Fluticasone-Salmeterol (ADVAIR DISKUS) 250-50 MCG/DOSE AEPB Inhale 1 puff into the lungs daily.    04/11/2019 at Unknown time  . furosemide (LASIX) 40 MG tablet Take 1 tablet (40 mg total) by mouth daily. 30 tablet 0 04/11/2019 at 1100  . levothyroxine (SYNTHROID) 75 MCG tablet Take 75 mcg by mouth daily before breakfast.   04/11/2019 at Unknown time  . multivitamin-iron-minerals-folic acid (CENTRUM) chewable tablet Chew 1 tablet by mouth daily.    04/11/2019 at 1100  . potassium chloride (K-DUR) 10 MEQ tablet Take 10 mEq by mouth daily.    04/11/2019 at 1100  . Travoprost, BAK Free, (TRAVATAN Z) 0.004 % SOLN ophthalmic solution PLACE 1 DROP INTO BOTH EYES ONCE DAILY  04/10/2019 at Unknown time  . warfarin (COUMADIN) 6 MG tablet Take 6.5 mg by mouth daily at 6 PM.    04/10/2019 at 1800   Social History   Socioeconomic History  . Marital status: Married    Spouse name: Not on file  . Number of children: Not on file  . Years of education: Not on file  . Highest education level: Not on file  Occupational History  . Not on file  Social Needs  . Financial resource strain: Not on file  . Food  insecurity    Worry: Not on file    Inability: Not on file  . Transportation needs    Medical: Not on file    Non-medical: Not on file  Tobacco Use  . Smoking status: Former Research scientist (life sciences)  . Smokeless tobacco: Never Used  Substance and Sexual Activity  . Alcohol use: Not Currently  . Drug use: Not Currently  . Sexual activity: Not on file  Lifestyle  . Physical activity    Days per week: Not on file    Minutes per session: Not on file  . Stress: Not on file  Relationships  . Social Herbalist on phone: Not on file    Gets together: Not on file    Attends religious service: Not on file    Active member of club or organization: Not on file    Attends meetings of clubs or organizations: Not on file    Relationship status: Not on file  . Intimate partner violence    Fear of current or ex partner: Not on file    Emotionally abused: Not on file    Physically abused: Not on file    Forced sexual activity: Not on file  Other Topics Concern  . Not on file  Social History Narrative  . Not on file    Family History  Problem Relation Age of Onset  . Hypertension Mother   . Hypertension Father     Review of systems complete and found to be negative unless listed above   PHYSICAL EXAM  General: Well developed, well nourished, in no acute distress HEENT:  Normocephalic and atramatic Neck:  No JVD.  Lungs: Clear bilaterally to auscultation and percussion. Heart: Irregularly irregular rhythm. Regular rate.. Normal S1 and S2 without gallops or murmur.  Extremities: 3+ pitting edema to the thighs bilaterally.  Neuro: Alert and oriented X 3. Psych:  Good affect, responds appropriately  Labs:   Lab Results  Component Value Date   WBC 4.1 04/11/2019   HGB 14.5 04/11/2019   HCT 44.3 04/11/2019   MCV 107.5 (H) 04/11/2019   PLT 124 (L) 04/11/2019    Recent Labs  Lab 04/11/19 1824  04/13/19 0634  NA 138   < > 138  K 3.6   < > 4.0  CL 103   < > 101  CO2 23   < > 28   BUN 36*   < > 29*  CREATININE 1.02   < > 1.12  CALCIUM 9.0   < > 8.6*  PROT 7.0  --   --   BILITOT 1.5*  --   --   ALKPHOS 56  --   --   ALT 21  --   --   AST 24  --   --   GLUCOSE 90   < > 95   < > = values in this interval not displayed.   Lab Results  Component Value  Date   TROPONINI 0.04 ([employer]) 10/13/2018   No results found for: CHOL No results found for: HDL No results found for: LDLCALC No results found for: TRIG No results found for: CHOLHDL No results found for: LDLDIRECT    Radiology: Dg Chest Port 1 View  Result Date: 04/11/2019 CLINICAL DATA:  Leg and ankle swelling, low blood pressure EXAM: PORTABLE CHEST 1 VIEW COMPARISON:  10/13/2018 FINDINGS: Gross cardiomegaly. Subtle heterogeneous opacity of the right lung base with a probable small layering pleural effusion. The visualized skeletal structures are unremarkable. IMPRESSION: Gross cardiomegaly. Subtle heterogeneous opacity of the right lung base with a probable small layering pleural effusion, concerning for infection or aspiration. Electronically Signed   By: Eddie Candle M.D.   On: 04/11/2019 16:59    ASSESSMENT AND PLAN:  Mr. Shillito is a 85 year old male with a history of permanent atrial fibrillation, who presented to the ED on 04/11/2019 for two weeks of progressive lower extremity edema. ER work up with significant elevated BNP to >2000, consistent with new onset heart failure, likely secondary to hypothyroidism as his TSH was 105 on 03/21/2019. In-patient ECHO shows severely reduced LV function with EF of 25-30%. Prior ECHO completed earlier this year had normal EF.   New Onset heart failure ECHO shows severly reduced LV dysfunction with EF of 25-30%. Prior ECHO completed in 09/2018 with normal EF at that time. Patient has been receiving IV lasix 40 mg BID but continues to have significant lower extremity edema on exam. He has been borderline hypotensive during this admission, which limits the ability to being  guideline directed medical therapy for heart failure with ACE/ARB.  - Increase diuretic therapy to IV lasix 80 mg BID until further diuresis is achieved  - Recommend starting low dose metoprolol succinate at 25 mg daily when appropriate  - Add ACE or ARB if tolerated by patient's blood pressure  - No further cardiac diagnostics or interventions indicated at this time.  - Close outpatient follow up with Doristine Mango or Dr. Ubaldo Glassing in 1 week  Permanent Atrial fibrillation - Continue anticoagulation with warfarin in the outpatient setting  - Not currently on any additional agents for rate control. Rate has been well controlled during his admission. Will be adding metoprolol when able for heart failure as per above.    The patient's history and exam findings were discussed with Dr. Nehemiah Massed. The plan was made in conjunction with Dr. Nehemiah Massed.  Signed: Hilbert Odor PA-C 04/13/2019, 2:44 PM  The patient has been interviewed and examined. I agree with assessment and plan above. Serafina Royals MD [facility]

## 2019-04-13 NOTE — NC FL2 (Signed)
[place] LEVEL OF CARE SCREENING TOOL     IDENTIFICATION  Patient Name: Gabriel Delacruz Birthdate: 07-22-1928 Sex: male Admission Date (Current Location): 04/11/2019  [place] and [place] Number:  Engineering geologist and Address:  [facility], [address], [place], [place] [address]      Provider Number: [id number]  Attending Physician Name and Address:  Saundra Shelling, MD  Relative Name and Phone Number:  Gabriel Delacruz, Gabriel Delacruz Daughter   [phone]    Current Level of Care: Hospital Recommended Level of Care: [place] Prior Approval Number:    Date Approved/Denied:   PASRR Number: [id number]  Discharge Plan: SNF    Current Diagnoses: Patient Active Problem List   Diagnosis Date Noted  . Acute exacerbation of CHF (congestive heart failure) ([place]) 04/11/2019  . Severe sinus bradycardia 10/13/2018  . MGUS (monoclonal gammopathy of unknown significance) 12/11/2015    Orientation RESPIRATION BLADDER Height & Weight     Self, Time, Situation, Place  Normal Continent Weight: 169 lb 12.1 oz (77 kg) Height:  5\' 6"  (167.6 cm)  BEHAVIORAL SYMPTOMS/MOOD NEUROLOGICAL BOWEL NUTRITION STATUS      Continent Diet(2G sodium diet)  AMBULATORY STATUS COMMUNICATION OF NEEDS Skin   Limited Assist Verbally Normal                       Personal Care Assistance Level of Assistance  Bathing, Dressing, Feeding Bathing Assistance: Limited assistance Feeding assistance: Independent Dressing Assistance: Limited assistance     Functional Limitations Info  Sight, Speech, Hearing Sight Info: Adequate Hearing Info: Adequate Speech Info: Adequate    SPECIAL CARE FACTORS FREQUENCY  PT (By licensed PT), OT (By licensed OT)     PT Frequency: Minimum 5x a week OT Frequency: Minimum 5x a week            Contractures Contractures Info: Not present    Additional Factors Info  Code Status, Allergies Code Status Info:  Full Code Allergies Info: No Known Allergies           Current Medications (04/13/2019):  This is the current hospital active medication list Current Facility-Administered Medications  Medication Dose Route Frequency Provider Last Rate Last Dose  . 0.9 %  sodium chloride infusion  250 mL Intravenous PRN Seals, Theo Dills, NP      . acetaminophen (TYLENOL) tablet 650 mg  650 mg Oral Q4H PRN Seals, Angela H, NP      . albuterol (PROVENTIL) (2.5 MG/3ML) 0.083% nebulizer solution 2.5 mg  2.5 mg Inhalation Q6H PRN Seals, Theo Dills, NP      . docusate sodium (COLACE) capsule 100 mg  100 mg Oral BID Saundra Shelling, MD   100 mg at 04/13/19 1144  . feeding supplement (ENSURE ENLIVE) (ENSURE ENLIVE) liquid 237 mL  237 mL Oral BID BM Pyreddy, Pavan, MD      . furosemide (LASIX) injection 80 mg  80 mg Intravenous BID Hilbert Odor, PA-C      . ipratropium-albuterol (DUONEB) 0.5-2.5 (3) MG/3ML nebulizer solution 3 mL  3 mL Nebulization TID Saundra Shelling, MD   3 mL at 04/13/19 1322  . latanoprost (XALATAN) 0.005 % ophthalmic solution 1 drop  1 drop Both Eyes QHS Seals, Theo Dills, NP   1 drop at 04/12/19 2116  . levothyroxine (SYNTHROID) tablet 88 mcg  88 mcg Oral Q0600 Mayer Camel, NP   88 mcg at 04/12/19 [id number]  . multivitamin with minerals tablet  1 tablet  1 tablet Oral Daily Seals, Theo Dills, NP   1 tablet at 04/13/19 1001  . ondansetron (ZOFRAN) injection 4 mg  4 mg Intravenous Q6H PRN Seals, Angela H, NP      . potassium chloride SA (K-DUR) CR tablet 10 mEq  10 mEq Oral Daily Seals, Angela H, NP   10 mEq at 04/13/19 1001  . sodium chloride flush (NS) 0.9 % injection 3 mL  3 mL Intravenous Q12H Seals, Theo Dills, NP   3 mL at 04/12/19 2116  . sodium chloride flush (NS) 0.9 % injection 3 mL  3 mL Intravenous PRN Seals, Theo Dills, NP      . Warfarin - Pharmacist Dosing Inpatient   Does not apply [id number] Saundra Shelling, MD         Discharge Medications: Please see discharge summary for a list of discharge  medications.  Relevant Imaging Results:  Relevant Lab Results:   Additional Information SSN [id number]  Ross Ludwig, LCSW

## 2019-04-13 NOTE — Care Management Important Message (Signed)
Important Message  Patient Details  Name: Gabriel Delacruz MRN: [id number] Date of Birth: 1927-11-17   Medicare Important Message Given:  Yes  Initial Medicare IM given by Patient Access Associate on 04/12/2019 at 10:37am.  Still valid.    [name] 04/13/2019, 8:34 AM

## 2019-04-13 NOTE — Progress Notes (Signed)
[facility] [place] NAME: Gabriel Delacruz    MR#:  [id number]  DATE OF BIRTH:  [birth year]  SUBJECTIVE:  CHIEF COMPLAINT:   Chief Complaint  Patient presents with  . Hypotension  . Leg Swelling  Patient seen and evaluated today Has swelling in the lower extremities decreasing Blood pressure is better Decreased shortness of breath Weaned off oxygen  REVIEW OF SYSTEMS:    ROS  CONSTITUTIONAL: No documented fever. No fatigue, weakness. No weight gain, no weight loss.  EYES: No blurry or double vision.  ENT: No tinnitus. No postnasal drip. No redness of the oropharynx.  RESPIRATORY: No cough, no wheeze, no hemoptysis. Has dyspnea.  CARDIOVASCULAR: No chest pain. Has orthopnea. No palpitations. No syncope.  GASTROINTESTINAL: No nausea, no vomiting or diarrhea. No abdominal pain. No melena or hematochezia.  GENITOURINARY: No dysuria or hematuria.  ENDOCRINE: No polyuria or nocturia. No heat or cold intolerance.  HEMATOLOGY: No anemia. No bruising. No bleeding.  INTEGUMENTARY: No rashes. No lesions.  MUSCULOSKELETAL: No arthritis. Has swelling. No gout.  NEUROLOGIC: No numbness, tingling, or ataxia. No seizure-type activity.  PSYCHIATRIC: No anxiety. No insomnia. No ADD.   DRUG ALLERGIES:  No Known Allergies  VITALS:  Blood pressure 98/60, pulse 84, temperature (!) 97.5 F (36.4 C), temperature source Oral, resp. rate 18, height 5\' 6"  (1.676 m), weight 77 kg, SpO2 100 %.  PHYSICAL EXAMINATION:   Physical Exam  GENERAL:  85 y.o.-year-old patient lying in the bed with no acute distress.  EYES: Pupils equal, round, reactive to light and accommodation. No scleral icterus. Extraocular muscles intact.  HEENT: Head atraumatic, normocephalic. Oropharynx and nasopharynx clear.  NECK:  Supple, no jugular venous distention. No thyroid enlargement, no tenderness.  LUNGS: Improved breath sounds bilaterally, bibasilar crepitations heard. No use of  accessory muscles of respiration.  CARDIOVASCULAR: S1, S2 normal. No murmurs, rubs, or gallops.  ABDOMEN: Soft, nontender, nondistended. Bowel sounds present. No organomegaly or mass.  EXTREMITIES: No cyanosis, clubbing  has edema b/l.    NEUROLOGIC: Cranial nerves II through XII are intact. No focal Motor or sensory deficits b/l.   PSYCHIATRIC: The patient is alert and oriented x 3.  SKIN: No obvious rash, lesion, or ulcer.   LABORATORY PANEL:   CBC Recent Labs  Lab 04/11/19 1615  WBC 4.1  HGB 14.5  HCT 44.3  PLT 124*   ------------------------------------------------------------------------------------------------------------------ Chemistries  Recent Labs  Lab 04/11/19 1824  04/13/19 0634  NA 138   < > 138  K 3.6   < > 4.0  CL 103   < > 101  CO2 23   < > 28  GLUCOSE 90   < > 95  BUN 36*   < > 29*  CREATININE 1.02   < > 1.12  CALCIUM 9.0   < > 8.6*  AST 24  --   --   ALT 21  --   --   ALKPHOS 56  --   --   BILITOT 1.5*  --   --    < > = values in this interval not displayed.   ------------------------------------------------------------------------------------------------------------------  Cardiac Enzymes No results for input(s): TROPONINI in the last 168 hours. ------------------------------------------------------------------------------------------------------------------  RADIOLOGY:  Dg Chest Port 1 View  Result Date: 04/11/2019 CLINICAL DATA:  Leg and ankle swelling, low blood pressure EXAM: PORTABLE CHEST 1 VIEW COMPARISON:  10/13/2018 FINDINGS: Gross cardiomegaly. Subtle heterogeneous opacity of the right lung base with a probable small layering  pleural effusion. The visualized skeletal structures are unremarkable. IMPRESSION: Gross cardiomegaly. Subtle heterogeneous opacity of the right lung base with a probable small layering pleural effusion, concerning for infection or aspiration. Electronically Signed   By: Eddie Candle M.D.   On: 04/11/2019 16:59      ASSESSMENT AND PLAN:  85 year old male patient with history of chronic atrial fibrillation, COPD, GERD, hyperlipidemia, hypertension, monoclonal gammopathy, hypothyroidism currently under hospitalist service  -New onset congestive heart failure Diurese patient with IV Lasix Echocardiogram shows reduced systolic function with EF of 25 to 30% Monitor electrolytes Cardiology consult follow-up Daily body weights and input / output chart  -Chronic atrial fibrillation Continue anticoagulation with Coumadin Rate well controlled INR therapeutic  -COPD appears stable Home dose inhalers  -DVT prophylaxis On Coumadin  -Ambulatory dysfunction Physical therapy evaluation  All the records are reviewed and case discussed with Care Management/Social Worker. Management plans discussed with the patient, family and they are in agreement.  CODE STATUS:Full code  DVT Prophylaxis: SCDs  TOTAL TIME TAKING CARE OF THIS PATIENT: 36 minutes.   POSSIBLE D/C IN 2 to 3 DAYS, DEPENDING ON CLINICAL CONDITION.  Saundra Shelling M.D on 04/13/2019 at 12:32 PM  Between 7am to 6pm - Pager - [phone]  After 6pm go to [email or web address] - password [email or web address] [facility]  Office  [phone]  CC: Primary care physician; Marinda Elk, MD  Note: This dictation was prepared with Dragon dictation along with smaller phrase technology. Any transcriptional errors that result from this process are unintentional.

## 2019-04-13 NOTE — NC FL2 (Deleted)
[place] LEVEL OF CARE SCREENING TOOL     IDENTIFICATION  Patient Name: Gabriel Delacruz Birthdate: 1928/04/09 Sex: male Admission Date (Current Location): 04/11/2019  [facility] and [place] Number:  Engineering geologist and Address:         Provider Number: [id number]  Attending Physician Name and Address:  Saundra Shelling, MD  Relative Name and Phone Number:  Linkyn, Hendler Daughter   [phone]    Current Level of Care: Hospital Recommended Level of Care: [place] Prior Approval Number:    Date Approved/Denied:   PASRR Number: [id number]  Discharge Plan: SNF    Current Diagnoses: Patient Active Problem List   Diagnosis Date Noted  . Acute exacerbation of CHF (congestive heart failure) ([place]) 04/11/2019  . Severe sinus bradycardia 10/13/2018  . MGUS (monoclonal gammopathy of unknown significance) 12/11/2015    Orientation RESPIRATION BLADDER Height & Weight     Self, Time, Situation, Place  Normal Continent Weight: 169 lb 12.1 oz (77 kg) Height:  5\' 6"  (167.6 cm)  BEHAVIORAL SYMPTOMS/MOOD NEUROLOGICAL BOWEL NUTRITION STATUS      Continent Diet(2G sodium diet)  AMBULATORY STATUS COMMUNICATION OF NEEDS Skin   Limited Assist Verbally Normal                       Personal Care Assistance Level of Assistance  Bathing, Dressing, Feeding Bathing Assistance: Limited assistance Feeding assistance: Independent Dressing Assistance: Limited assistance     Functional Limitations Info  Sight, Speech, Hearing Sight Info: Adequate Hearing Info: Adequate Speech Info: Adequate    SPECIAL CARE FACTORS FREQUENCY  PT (By licensed PT), OT (By licensed OT)     PT Frequency: Minimum 5x a week OT Frequency: Minimum 5x a week            Contractures Contractures Info: Not present    Additional Factors Info  Code Status, Allergies Code Status Info: Full Code Allergies Info: No Known Allergies           Current  Medications (04/13/2019):  This is the current hospital active medication list Current Facility-Administered Medications  Medication Dose Route Frequency Provider Last Rate Last Dose  . 0.9 %  sodium chloride infusion  250 mL Intravenous PRN Seals, Theo Dills, NP      . acetaminophen (TYLENOL) tablet 650 mg  650 mg Oral Q4H PRN Seals, Angela H, NP      . albuterol (PROVENTIL) (2.5 MG/3ML) 0.083% nebulizer solution 2.5 mg  2.5 mg Inhalation Q6H PRN Seals, Theo Dills, NP      . docusate sodium (COLACE) capsule 100 mg  100 mg Oral BID Saundra Shelling, MD   100 mg at 04/13/19 1144  . feeding supplement (ENSURE ENLIVE) (ENSURE ENLIVE) liquid 237 mL  237 mL Oral BID BM Pyreddy, Pavan, MD      . furosemide (LASIX) injection 80 mg  80 mg Intravenous BID Hilbert Odor, PA-C      . ipratropium-albuterol (DUONEB) 0.5-2.5 (3) MG/3ML nebulizer solution 3 mL  3 mL Nebulization TID Saundra Shelling, MD   3 mL at 04/13/19 1322  . latanoprost (XALATAN) 0.005 % ophthalmic solution 1 drop  1 drop Both Eyes QHS Seals, Theo Dills, NP   1 drop at 04/12/19 2116  . levothyroxine (SYNTHROID) tablet 88 mcg  88 mcg Oral Q0600 Mayer Camel, NP   88 mcg at 04/12/19 [id number]  . multivitamin with minerals tablet 1 tablet  1 tablet Oral Daily Seals, Levada Dy  H, NP   1 tablet at 04/13/19 1001  . ondansetron (ZOFRAN) injection 4 mg  4 mg Intravenous Q6H PRN Seals, Angela H, NP      . potassium chloride SA (K-DUR) CR tablet 10 mEq  10 mEq Oral Daily Seals, Angela H, NP   10 mEq at 04/13/19 1001  . sodium chloride flush (NS) 0.9 % injection 3 mL  3 mL Intravenous Q12H Seals, Theo Dills, NP   3 mL at 04/12/19 2116  . sodium chloride flush (NS) 0.9 % injection 3 mL  3 mL Intravenous PRN Seals, Theo Dills, NP      . Warfarin - Pharmacist Dosing Inpatient   Does not apply [id number] Saundra Shelling, MD         Discharge Medications: Please see discharge summary for a list of discharge medications.  Relevant Imaging Results:  Relevant Lab  Results:   Additional Information SSN [id number]  Ross Ludwig, LCSW

## 2019-04-13 NOTE — Evaluation (Signed)
Physical Therapy Evaluation Patient Details Name: Gabriel Delacruz MRN: [id number] DOB: [birth year] Today's Date: 04/13/2019   History of Present Illness  85 year old male patient with history of chronic atrial fibrillation, COPD, GERD, hyperlipidemia, hypertension, monoclonal gammopathy, hypothyroidism admitted for hypotension, leg swelling, work up showed new onset congestive heart failure    Clinical Impression  Patient in bed, oriented x4 at start of session, stated that his L knee is painful, unable to quantify, chronic pain. Stated he lives alone and that family and friends check in on him, and bring him meals. Mod I for ambulation/ADLs prior to admission, denies any falls in the last 6 months.  The patient demonstrated bed mobility with minA and was able to sit EOB with fair sitting balance and participate in therapeutic exercises with verbal/visual cues, exhibited posterior lean to maintain balance. Sit <> Stand from bed minA to complete transfer/stabilize RW, CGA from recliner with heavy reliance on UE. Pt ambulated ~16ft with RW and CGA. Demonstrated decreased gait velocity, stride length, and bilateral foot clearance. No overt LOB noted except when attempting to retroambulate in prep to sit in recliner.  Overall the patient demonstrated deficits (see "PT Problem List") that impede the patient's functional abilities, safety, and mobility and would benefit from skilled PT intervention. Recommendation is STR due to current functional status and decreased caregiver support at home.     Follow Up Recommendations SNF    Equipment Recommendations  None recommended by PT;Other (comment)(Pt has RW, standard walker, cane at home)    Recommendations for Other Services       Precautions / Restrictions Precautions Precautions: Fall Restrictions Weight Bearing Restrictions: No      Mobility  Bed Mobility Overal bed mobility: Needs Assistance Bed Mobility: Supine to Sit     Supine  to sit: HOB elevated;Min assist     General bed mobility comments: very little physical assist to scoot to EOB anteriorly, extended time to maximize pt participation  Transfers Overall transfer level: Needs assistance Equipment used: Rolling walker (2 wheeled) Transfers: Sit to/from Stand Sit to Stand: Min assist;Min guard         General transfer comment: first attempt minA to complete transfer, CGA from recliner with heavy use of UE  Ambulation/Gait Ambulation/Gait assistance: Min guard Gait Distance (Feet): 20 Feet Assistive device: Rolling walker (2 wheeled)   Gait velocity: decreased   General Gait Details: Slow, shuffled step. Decreased foot clearance and stride length bilaterally with ambulation foward no LOB, mild LOB with posterior ambulation in prep to sit in recliner.  Stairs            Wheelchair Mobility    Modified Rankin (Stroke Patients Only)       Balance Overall balance assessment: Needs assistance Sitting-balance support: Feet supported Sitting balance-Leahy Scale: Fair       Standing balance-Leahy Scale: Fair                               Pertinent Vitals/Pain Pain Assessment: No/denies pain    Home Living Family/patient expects to be discharged to:: Private residence Living Arrangements: Children Available Help at Discharge: Family Type of Home: House Home Access: Stairs to enter Entrance Stairs-Rails: Psychiatric nurse of Steps: 2 Home Layout: One level Home Equipment: Environmental consultant - 2 wheels;Walker - standard      Prior Function Level of Independence: Independent with assistive device(s)         Comments: family  brings him meals currently do to the virus     Hand Dominance        Extremity/Trunk Assessment   Upper Extremity Assessment Upper Extremity Assessment: Generalized weakness    Lower Extremity Assessment Lower Extremity Assessment: Generalized weakness    Cervical / Trunk  Assessment Cervical / Trunk Assessment: Kyphotic  Communication   Communication: No difficulties  Cognition Arousal/Alertness: Awake/alert Behavior During Therapy: WFL for tasks assessed/performed Overall Cognitive Status: Within Functional Limits for tasks assessed                                        General Comments      Exercises General Exercises - Lower Extremity Long Arc Quad: AROM;Strengthening;Both;10 reps Hip Flexion/Marching: AROM;Strengthening;Both;10 reps Toe Raises: AROM;Strengthening;Both;10 reps Heel Raises: Strengthening;AROM;Both;10 reps   Assessment/Plan    PT Assessment Patient needs continued PT services  PT Problem List Decreased strength;Decreased mobility;Decreased activity tolerance;Decreased balance       PT Treatment Interventions DME instruction;Therapeutic exercise;Gait training;Balance training;Stair training;Neuromuscular re-education;Functional mobility training;Therapeutic activities;Patient/family education    PT Goals (Current goals can be found in the Care Plan section)  Acute Rehab PT Goals Patient Stated Goal: to go home PT Goal Formulation: With patient Time For Goal Achievement: 04/27/19 Potential to Achieve Goals: Good    Frequency Min 2X/week   Barriers to discharge Decreased caregiver support      Co-evaluation               AM-PAC PT "6 Clicks" Mobility  Outcome Measure Help needed turning from your back to your side while in a flat bed without using bedrails?: A Little Help needed moving from lying on your back to sitting on the side of a flat bed without using bedrails?: A Little Help needed moving to and from a bed to a chair (including a wheelchair)?: A Little Help needed standing up from a chair using your arms (e.g., wheelchair or bedside chair)?: A Little Help needed to walk in hospital room?: A Little Help needed climbing 3-5 steps with a railing? : A Lot 6 Click Score: 17    End of  Session Equipment Utilized During Treatment: Gait belt Activity Tolerance: Patient tolerated treatment well Patient left: in chair;with chair alarm set;with call bell/phone within reach Nurse Communication: Mobility status PT Visit Diagnosis: Other abnormalities of gait and mobility (R26.89);Muscle weakness (generalized) (M62.81);Difficulty in walking, not elsewhere classified (R26.2)    Time: [id number] PT Time Calculation (min) (ACUTE ONLY): 39 min   Charges:   PT Evaluation $PT Eval Low Complexity: 1 Low PT Treatments $Therapeutic Exercise: 23-37 mins        Lieutenant Diego PT, DPT 3:16 PM,04/13/19 [id number]

## 2019-04-13 NOTE — Plan of Care (Signed)
  Problem: Activity: Goal: Risk for activity intolerance will decrease Outcome: Progressing   Problem: Education: Goal: Ability to demonstrate management of disease process will improve Outcome: Progressing   Problem: Activity: Goal: Capacity to carry out activities will improve Outcome: Progressing   Problem: Cardiac: Goal: Ability to achieve and maintain adequate cardiopulmonary perfusion will improve Outcome: Progressing

## 2019-04-14 LAB — BASIC METABOLIC PANEL
Anion gap: 8 (ref 5–15)
BUN: 26 mg/dL — ABNORMAL HIGH (ref 8–23)
CO2: 28 mmol/L (ref 22–32)
Calcium: 8.7 mg/dL — ABNORMAL LOW (ref 8.9–10.3)
Chloride: 99 mmol/L (ref 98–111)
Creatinine, Ser: 1.07 mg/dL (ref 0.61–1.24)
GFR calc Af Amer: >60 mL/min (ref >60)
GFR calc non Af Amer: >60 mL/min (ref >60)
Glucose, Bld: 100 mg/dL — ABNORMAL HIGH (ref 70–99)
Potassium: 4.1 mmol/L (ref 3.5–5.1)
Sodium: 135 mmol/L (ref 135–145)

## 2019-04-14 LAB — CBC
HCT: 39.4 % (ref 39.0–52.0)
Hemoglobin: 13.2 g/dL (ref 13.0–17.0)
MCH: 36.3 pg — ABNORMAL HIGH (ref 26.0–34.0)
MCHC: 33.5 g/dL (ref 30.0–36.0)
MCV: 108.2 fL — ABNORMAL HIGH (ref 80.0–100.0)
Platelets: 114 10*3/uL — ABNORMAL LOW (ref 150–400)
RBC: 3.64 MIL/uL — ABNORMAL LOW (ref 4.22–5.81)
RDW: 14.7 % (ref 11.5–15.5)
WBC: 4.2 10*3/uL (ref 4.0–10.5)
nRBC: 0 % (ref 0.0–0.2)

## 2019-04-14 LAB — PROTIME-INR
INR: 2.3 — ABNORMAL HIGH (ref 0.8–1.2)
Prothrombin Time: 24.8 seconds — ABNORMAL HIGH (ref 11.4–15.2)

## 2019-04-14 MED ORDER — DOCUSATE SODIUM 100 MG PO CAPS: 100.0000 mg | ORAL_CAPSULE | Freq: Two times a day (BID) | ORAL | Status: DC

## 2019-04-14 MED ORDER — POLYETHYLENE GLYCOL 3350 17 G PO PACK
17.0000 g | PACK | Freq: Every day | ORAL | Status: DC
  Administered 2019-04-14 – 2019-05-04 (×16): 17 g via ORAL
  Filled 2019-04-14 (×19): qty 1

## 2019-04-14 MED ORDER — FUROSEMIDE 10 MG/ML IJ SOLN
60.0000 mg | Freq: Two times a day (BID) | INTRAMUSCULAR | Status: DC
  Administered 2019-04-14 – 2019-04-15 (×2): 60 mg via INTRAVENOUS
  Filled 2019-04-14 (×2): qty 6

## 2019-04-14 MED ORDER — WARFARIN SODIUM 2 MG PO TABS
2.0000 mg | ORAL_TABLET | Freq: Once | ORAL | Status: AC
  Administered 2019-04-14: 2 mg via ORAL
  Filled 2019-04-14: qty 1

## 2019-04-14 NOTE — Progress Notes (Signed)
[facility] at [place] NAME: Jabarri Interrante    MR#:  [id number]  DATE OF BIRTH:  1928/06/20  SUBJECTIVE:  CHIEF COMPLAINT:   Chief Complaint  Patient presents with  . Hypotension  . Leg Swelling  Decreased leg edema, less shortness of breath.  No chest pain. REVIEW OF SYSTEMS:    ROS  CONSTITUTIONAL: No documented fever. No fatigue, weakness. No weight gain, no weight loss.  EYES: No blurry or double vision.  ENT: No tinnitus. No postnasal drip. No redness of the oropharynx.  RESPIRATORY: No cough, no wheeze, no hemoptysis. Has dyspnea.  CARDIOVASCULAR: No chest pain. Has orthopnea. No palpitations. No syncope.  GASTROINTESTINAL: No nausea, no vomiting or diarrhea. No abdominal pain. No melena or hematochezia.  GENITOURINARY: No dysuria or hematuria.  ENDOCRINE: No polyuria or nocturia. No heat or cold intolerance.  HEMATOLOGY: No anemia. No bruising. No bleeding.  INTEGUMENTARY: No rashes. No lesions.  MUSCULOSKELETAL: No arthritis. Has swelling. No gout.  NEUROLOGIC: No numbness, tingling, or ataxia. No seizure-type activity.  PSYCHIATRIC: No anxiety. No insomnia. No ADD.   DRUG ALLERGIES:  No Known Allergies  VITALS:  Blood pressure 103/74, pulse 70, temperature 98.2 F (36.8 C), temperature source Oral, resp. rate (!) 21, height 5\' 6"  (1.676 m), weight 78.9 kg, SpO2 98 %.  PHYSICAL EXAMINATION:   Physical Exam  GENERAL:  85 y.o.-year-old patient lying in the bed with no acute distress.  EYES: Pupils equal, round, reactive to light and accommodation. No scleral icterus. Extraocular muscles intact.  HEENT: Head atraumatic, normocephalic. Oropharynx and nasopharynx clear.  NECK:  Supple, no jugular venous distention. No thyroid enlargement, no tenderness.  LUNGS: Improved breath sounds bilaterally, no crepitations today. CARDIOVASCULAR: S1, S2 regular.. No murmurs, rubs, or gallops.  ABDOMEN: Soft, nontender, nondistended. Bowel  sounds present. No organomegaly or mass.  EXTREMITIES: No cyanosis, clubbing  has edema b/l.    NEUROLOGIC: Cranial nerves II through XII are intact. No focal Motor or sensory deficits b/l.   PSYCHIATRIC: The patient is alert and oriented x 3.  SKIN: No obvious rash, lesion, or ulcer.   LABORATORY PANEL:   CBC Recent Labs  Lab 04/14/19 0537  WBC 4.2  HGB 13.2  HCT 39.4  PLT 114*   ------------------------------------------------------------------------------------------------------------------ Chemistries  Recent Labs  Lab 04/11/19 1824  04/14/19 0537  NA 138   < > 135  K 3.6   < > 4.1  CL 103   < > 99  CO2 23   < > 28  GLUCOSE 90   < > 100*  BUN 36*   < > 26*  CREATININE 1.02   < > 1.07  CALCIUM 9.0   < > 8.7*  AST 24  --   --   ALT 21  --   --   ALKPHOS 56  --   --   BILITOT 1.5*  --   --    < > = values in this interval not displayed.   ------------------------------------------------------------------------------------------------------------------  Cardiac Enzymes No results for input(s): TROPONINI in the last 168 hours. ------------------------------------------------------------------------------------------------------------------  RADIOLOGY:  No results found.   ASSESSMENT AND PLAN:  85 year old male patient with history of chronic atrial fibrillation, COPD, GERD, hyperlipidemia, hypertension, monoclonal gammopathy, hypothyroidism currently under hospitalist service  -New onset congestive heart failure Diurese patient with IV Lasix Echocardiogram shows reduced systolic function with EF of 25 to 30% Monitor electrolytes Slightly elevated troponin secondary to demand ischemia. Change to oral diuretics  today.  Daily body weights and input / output chart  -Chronic atrial fibrillation Continue anticoagulation with Coumadin Rate well controlled INR therapeutic, seen by Dr. Nehemiah Massed from cardiology, status post pacemaker for atrial  fibrillation.  -COPD appears stable Home dose inhalers  -DVT prophylaxis On Coumadin  -Ambulatory dysfunction Physical therapy recommends skilled nursing facility.   waiting for insurance approval.  All the records are reviewed and case discussed with Care Management/Social Worker. Management plans discussed with the patient, family and they are in agreement.  CODE STATUS:Full code  DVT Prophylaxis: SCDs  TOTAL TIME TAKING CARE OF THIS PATIENT: 38 minutes.  More than 50% time spent in counseling, coordination of care POSSIBLE D/C IN 2 to 3 DAYS, DEPENDING ON CLINICAL CONDITION.  Epifanio Lesches M.D on 04/14/2019 at 12:55 PM  Between 7am to 6pm - Pager - [phone]  After 6pm go to [email or web address] - password [email or web address] [facility]  Office  [phone]  CC: Primary care physician; Marinda Elk, MD  Note: This dictation was prepared with Dragon dictation along with smaller phrase technology. Any transcriptional errors that result from this process are unintentional.

## 2019-04-14 NOTE — Progress Notes (Signed)
Physical Therapy Treatment Patient Details Name: Gabriel Delacruz MRN: [id number] DOB: [birth year] Today's Date: 04/14/2019    History of Present Illness 85 year old male patient with history of chronic atrial fibrillation, COPD, GERD, hyperlipidemia, hypertension, monoclonal gammopathy, hypothyroidism admitted for hypotension, leg swelling, work up showed new onset congestive heart failure    PT Comments    Pt reported L knee pain today which affected bed mobility, transfers and ambulation.  He appeared to experience some confusion with directions as well as being [facility] today.  Pt required min-mod A for bed mobility and was able to sit at bedside without assistance.  He required mod A for STS transfer and wt shifted away from L LE, citing pain.  Pt with unsteady stance and unsafe to attempt walking today.  He was able to complete a standing pivot transfer min A without difficulty.  PT educated pt concerning there ex for management of knee pain and pt was open but did require manual assistance for mobility of L LE during there ex.   PT noted blood on pt's sheets and dried blood at pt's IV and RN was notified.  Pt will continue to benefit from skilled PT with focus on strength, pain management and safe functional mobility.   Follow Up Recommendations  SNF     Equipment Recommendations  None recommended by PT;Other (comment)(Pt has RW, standard walker, cane at home)    Recommendations for Other Services       Precautions / Restrictions Precautions Precautions: Fall    Mobility  Bed Mobility Overal bed mobility: Needs Assistance Bed Mobility: Supine to Sit     Supine to sit: Min assist     General bed mobility comments: Assistance to mobilize L LE due to pain.  Pt able to scoot to R side along EOB 5x with use of UE.  VC's to avoid using closed fist due to IV in R hand.  Transfers Overall transfer level: Needs assistance Equipment used: Rolling walker (2 wheeled) Transfers: Sit  to/from [facility] Sit to Stand: Mod assist Stand pivot transfers: Min assist       General transfer comment: Pt able to rise from elevated bedside with heavy use of UE's and wt shift away from L side.  Attempted several steps but was unsafe to step away from bed.  Opted for stand pivot transfer instead, which pt was able to follow directions to complete without difficulty.  Ambulation/Gait                 Stairs             Wheelchair Mobility    Modified Rankin (Stroke Patients Only)       Balance Overall balance assessment: Needs assistance Sitting-balance support: Feet supported Sitting balance-Leahy Scale: Fair       Standing balance-Leahy Scale: Fair                              Cognition Arousal/Alertness: Awake/alert Behavior During Therapy: WFL for tasks assessed/performed Overall Cognitive Status: No family/caregiver present to determine baseline cognitive functioning                                 General Comments: Pt very HOH and appeared to also not understand directions as well at times.      Exercises General Exercises - Lower Extremity Quad Sets: Left;10 reps;Supine  Heel Slides: Left;10 reps;Supine;AAROM Hip ABduction/ADduction: Strengthening;AAROM;Left;10 reps;Supine Other Exercises Other Exercises: Education regarding benefit of ther ex for L knee pain x2 min    General Comments        Pertinent Vitals/Pain Pain Assessment: Faces Faces Pain Scale: Hurts little more Pain Location: L knee with movement. Pain Intervention(s): Monitored during session    Home Living                      Prior Function            PT Goals (current goals can now be found in the care plan section) Acute Rehab PT Goals Patient Stated Goal: to go home PT Goal Formulation: With patient Time For Goal Achievement: 04/27/19 Potential to Achieve Goals: Good Progress towards PT goals: PT to  reassess next treatment    Frequency    Min 2X/week      PT Plan Current plan remains appropriate    Co-evaluation              AM-PAC PT "6 Clicks" Mobility   Outcome Measure  Help needed turning from your back to your side while in a flat bed without using bedrails?: A Little Help needed moving from lying on your back to sitting on the side of a flat bed without using bedrails?: A Little Help needed moving to and from a bed to a chair (including a wheelchair)?: A Little Help needed standing up from a chair using your arms (e.g., wheelchair or bedside chair)?: A Lot Help needed to walk in hospital room?: A Lot Help needed climbing 3-5 steps with a railing? : A Lot 6 Click Score: 15    End of Session Equipment Utilized During Treatment: Gait belt Activity Tolerance: Patient limited by fatigue;Patient limited by pain(L knee pain.) Patient left: in chair;with chair alarm set;with call bell/phone within reach;with nursing/sitter in room Nurse Communication: Mobility status(IV in R hand appeared to have dried blood and also blood on pt's bedsheets in area where R UE rests.) PT Visit Diagnosis: Other abnormalities of gait and mobility (R26.89);Muscle weakness (generalized) (M62.81);Difficulty in walking, not elsewhere classified (R26.2)     Time: [id number] PT Time Calculation (min) (ACUTE ONLY): 24 min  Charges:  $Therapeutic Exercise: 8-22 mins $Therapeutic Activity: 8-22 mins                     Roxanne Gates, PT, DPT  Roxanne Gates 04/14/2019, 4:40 PM

## 2019-04-14 NOTE — Plan of Care (Signed)
  Problem: Clinical Measurements: Goal: Ability to maintain clinical measurements within normal limits will improve Outcome: Progressing Goal: Respiratory complications will improve Outcome: Progressing Goal: Cardiovascular complication will be avoided Outcome: Progressing   Problem: Activity: Goal: Risk for activity intolerance will decrease Outcome: Progressing   Problem: Cardiac: Goal: Ability to achieve and maintain adequate cardiopulmonary perfusion will improve Outcome: Progressing

## 2019-04-14 NOTE — Progress Notes (Signed)
[facility] Encounter Note  Patient: Gabriel Delacruz / Admit Date: 04/11/2019 / Date of Encounter: 04/14/2019, 9:15 AM   Subjective: Patient is breathing well today with no evidence of significant congestive heart failure or pulmonary edema symptoms although still with significant lower extremity edema multifactorial in nature.  The patient does have atrial fibrillation with pacemaker back currently stable at this time with no evidence of myocardial infarction with troponin level of 62 consistent with demand ischemia.  Patient is hemodynamically stable  Review of Systems: Positive for: Edema Negative for: Vision change, hearing change, syncope, dizziness, nausea, vomiting,diarrhea, bloody stool, stomach pain, cough, congestion, diaphoresis, urinary frequency, urinary pain,skin lesions, skin rashes Others previously listed  Objective: Telemetry: Atrial fibrillation with ventricular pacing Physical Exam: Blood pressure 103/74, pulse 70, temperature 98.2 F (36.8 C), temperature source Oral, resp. rate (!) 21, height 5\' 6"  (1.676 m), weight 78.9 kg, SpO2 98 %. Body mass index is 28.08 kg/m. General: Well developed, well nourished, in no acute distress. Head: Normocephalic, atraumatic, sclera non-icteric, no xanthomas, nares are without discharge. Neck: No apparent masses Lungs: Normal respirations with no wheezes, some rhonchi, no rales , few crackles   Heart: Regular rate and rhythm, normal S1 S2, no murmur, no rub, no gallop, PMI is normal size and placement, carotid upstroke normal without bruit, jugular venous pressure normal Abdomen: Soft, non-tender, non-distended with normoactive bowel sounds. No hepatosplenomegaly. Abdominal aorta is normal size without bruit Extremities: 2+ edema, no clubbing, no cyanosis, no ulcers,  Peripheral: 2+ radial, 2+ femoral, 2+ dorsal pedal pulses Neuro: Alert and oriented. Moves all extremities spontaneously. Psych:  Responds to  questions appropriately with a normal affect.   Intake/Output Summary (Last 24 hours) at 04/14/2019 0915 Last data filed at 04/14/2019 0358 Gross per 24 hour  Intake 720 ml  Output 450 ml  Net 270 ml    Inpatient Medications:  . docusate sodium  100 mg Oral BID  . feeding supplement (ENSURE ENLIVE)  237 mL Oral BID BM  . furosemide  80 mg Intravenous BID  . ipratropium-albuterol  3 mL Nebulization TID  . latanoprost  1 drop Both Eyes QHS  . levothyroxine  88 mcg Oral Q0600  . multivitamin with minerals  1 tablet Oral Daily  . potassium chloride SA  10 mEq Oral Daily  . sodium chloride flush  3 mL Intravenous Q12H  . Warfarin - Pharmacist Dosing Inpatient   Does not apply q1800   Infusions:  . sodium chloride      Labs: Recent Labs    04/13/19 0634 04/14/19 0537  NA 138 135  K 4.0 4.1  CL 101 99  CO2 28 28  GLUCOSE 95 100*  BUN 29* 26*  CREATININE 1.12 1.07  CALCIUM 8.6* 8.7*   Recent Labs    04/11/19 1824  AST 24  ALT 21  ALKPHOS 56  BILITOT 1.5*  PROT 7.0  ALBUMIN 3.9   Recent Labs    04/11/19 1615 04/14/19 0537  WBC 4.1 4.2  NEUTROABS 1.7  --   HGB 14.5 13.2  HCT 44.3 39.4  MCV 107.5* 108.2*  PLT 124* 114*   No results for input(s): CKTOTAL, CKMB, TROPONINI in the last 72 hours. Invalid input(s): POCBNP No results for input(s): HGBA1C in the last 72 hours.   Weights: Filed Weights   04/12/19 0349 04/13/19 0314 04/14/19 0358  Weight: 76.3 kg 77 kg 78.9 kg     Radiology/Studies:  Dg Chest [facility]  Result Date: 04/11/2019 CLINICAL DATA:  Leg and ankle swelling, low blood pressure EXAM: PORTABLE CHEST 1 VIEW COMPARISON:  10/13/2018 FINDINGS: Gross cardiomegaly. Subtle heterogeneous opacity of the right lung base with a probable small layering pleural effusion. The visualized skeletal structures are unremarkable. IMPRESSION: Gross cardiomegaly. Subtle heterogeneous opacity of the right lung base with a probable small layering pleural  effusion, concerning for infection or aspiration. Electronically Signed   By: Eddie Candle M.D.   On: 04/11/2019 16:59     Assessment and Recommendation  85 y.o. male with known permanent atrial fibrillation status post pacemaker placement currently stable with elevation of troponin consistent with demand ischemia and acute on chronic diastolic dysfunction heart failure mainly lower extremity edema and venous insufficiency continuing to need further treatment 1.  Continue following telemetry for heart rate control of sick sinus syndrome status post pacemaker placement 2.  Warfarin for further risk reduction in stroke with atrial fibrillation 3.  Furosemide for lower extremity edema and continue treatment for heart failure 4.  Compression hose Unna boots elevation of feet and other mechanical treatments of severe lower extremity edema 5.  No further cardiac diagnostics necessary at this time 6.  Begin ambulation and follow for improvements and okay for discharge home from cardiac standpoint with follow-up next week for further adjustments  Signed, Serafina Royals M.D. FACC

## 2019-04-14 NOTE — Progress Notes (Signed)
[place] for warfarin dosing/monitoring Indication: atrial fibrillation  No Known Allergies  Patient Measurements: Height: 5\' 6"  (167.6 cm) Weight: 173 lb 15.1 oz (78.9 kg) IBW/kg (Calculated) : 63.8  Vital Signs: Temp: 98.2 F (36.8 C) (08/29 0808) Temp Source: Oral (08/29 0808) BP: 103/74 (08/29 0808) Pulse Rate: 70 (08/29 0808)  Labs: Recent Labs    04/11/19 1615  04/11/19 1824 04/11/19 2120 04/12/19 0504 04/13/19 0634 04/14/19 0537  HGB 14.5  --   --   --   --   --  13.2  HCT 44.3  --   --   --   --   --  39.4  PLT 124*  --   --   --   --   --  114*  LABPROT 32.9*  --   --   --  29.8* 32.0* 24.8*  INR 3.3*  --   --   --  2.9* 3.2* 2.3*  CREATININE  --    < > 1.02  --  1.10 1.12 1.07  TROPONINIHS  --   --  22* 21*  --   --   --    < > = values in this interval not displayed.    Estimated Creatinine Clearance: 46.2 mL/min (by C-G formula based on SCr of 1.07 mg/dL).   Medical History: Past Medical History:  Diagnosis Date  . Anemia   . Atrial fibrillation ([place])   . COPD (chronic obstructive pulmonary disease) ([place])   . GERD (gastroesophageal reflux disease)   . Glaucoma   . Hyperlipemia   . Hypertension   . Hypothyroidism   . Irregular heart rhythm   . Left anterior fascicular block   . MGUS (monoclonal gammopathy of unknown significance)   . RBBB     Medications:  Scheduled:  . docusate sodium  100 mg Oral BID  . feeding supplement (ENSURE ENLIVE)  237 mL Oral BID BM  . furosemide  60 mg Intravenous BID  . ipratropium-albuterol  3 mL Nebulization TID  . latanoprost  1 drop Both Eyes QHS  . levothyroxine  88 mcg Oral Q0600  . multivitamin with minerals  1 tablet Oral Daily  . potassium chloride SA  10 mEq Oral Daily  . sodium chloride flush  3 mL Intravenous Q12H  . Warfarin - Pharmacist Dosing Inpatient   Does not apply q1800    Assessment: Patient admitted x hypotension and leg swelling w/ h/o afib s/p  PM w/ RBBB and EKG showing accelerated rhythm w/ PVCs, DM, hypothyroidism w/ TSH of 16 08/26, w/ worsening bilateral LE edema w/ hypotension leading to his losartan and hctz being discontinued w/ worsening leg swelling. Patient is anticoagulated on warfarin 6.5 mg daily PTA for afib. No major DDI. Pt has HFrEF which can increase INR.    Date INR Warfarin Dose  8/26 3.3 None  8/27 2.9 2 mg  8/28 3.2 Hold  8/29 2.3         Goal of Therapy:  INR 2-3 Monitor platelets by anticoagulation protocol: Yes   Plan:  INR is therapeutic. Will order 2 mg warfarin x 1 tonight. Daily INR ordered.  Chinita Greenland PharmD Clinical Pharmacist 04/14/2019

## 2019-04-14 NOTE — Plan of Care (Signed)
  Problem: Education: Goal: Knowledge of General Education information will improve Description: Including pain rating scale, medication(s)/side effects and non-pharmacologic comfort measures Outcome: Progressing   Problem: Health Behavior/Discharge Planning: Goal: Ability to manage health-related needs will improve Outcome: Not Progressing Note: Patient to go to SNF, hopefully Monday morning. Awaiting insurance approval. Daron Offer

## 2019-04-15 LAB — BASIC METABOLIC PANEL
Anion gap: 10 (ref 5–15)
BUN: 27 mg/dL — ABNORMAL HIGH (ref 8–23)
CO2: 29 mmol/L (ref 22–32)
Calcium: 8.8 mg/dL — ABNORMAL LOW (ref 8.9–10.3)
Chloride: 96 mmol/L — ABNORMAL LOW (ref 98–111)
Creatinine, Ser: 0.91 mg/dL (ref 0.61–1.24)
GFR calc Af Amer: >60 mL/min (ref >60)
GFR calc non Af Amer: >60 mL/min (ref >60)
Glucose, Bld: 105 mg/dL — ABNORMAL HIGH (ref 70–99)
Potassium: 4 mmol/L (ref 3.5–5.1)
Sodium: 135 mmol/L (ref 135–145)

## 2019-04-15 LAB — PROTIME-INR
INR: 2.2 — ABNORMAL HIGH (ref 0.8–1.2)
Prothrombin Time: 24.5 seconds — ABNORMAL HIGH (ref 11.4–15.2)

## 2019-04-15 MED ORDER — IPRATROPIUM-ALBUTEROL 0.5-2.5 (3) MG/3ML IN SOLN
3.0000 mL | Freq: Two times a day (BID) | RESPIRATORY_TRACT | Status: DC
  Administered 2019-04-15 – 2019-04-19 (×9): 3 mL via RESPIRATORY_TRACT
  Filled 2019-04-15 (×9): qty 3

## 2019-04-15 MED ORDER — WARFARIN SODIUM 2 MG PO TABS
2.0000 mg | ORAL_TABLET | Freq: Once | ORAL | Status: AC
  Administered 2019-04-15: 2 mg via ORAL
  Filled 2019-04-15: qty 1

## 2019-04-15 MED ORDER — FUROSEMIDE 20 MG PO TABS
20.0000 mg | ORAL_TABLET | Freq: Two times a day (BID) | ORAL | Status: DC
  Administered 2019-04-15: 20 mg via ORAL
  Filled 2019-04-15: qty 1

## 2019-04-15 NOTE — Progress Notes (Signed)
[facility] Encounter Note  Patient: Gabriel Delacruz / Admit Date: 04/11/2019 / Date of Encounter: 04/15/2019, 6:19 AM   Subjective: Patient is breathing well today with no evidence of significant congestive heart failure or pulmonary edema symptoms although still with significant lower extremity edema multifactorial in nature.  The patient does have atrial fibrillation with pacemaker back currently stable at this time with no evidence of myocardial infarction with troponin level of 62 consistent with demand ischemia.  Echocardiogram showing no significant global LV systolic dysfunction ejection fraction of 25 to 30% with some mild to moderate valvular heart disease.  Patient has not been able to ambulate at this time.  Patient is hemodynamically stable  Review of Systems: Positive for: Edema Negative for: Vision change, hearing change, syncope, dizziness, nausea, vomiting,diarrhea, bloody stool, stomach pain, cough, congestion, diaphoresis, urinary frequency, urinary pain,skin lesions, skin rashes Others previously listed  Objective: Telemetry: Atrial fibrillation with ventricular pacing Physical Exam: Blood pressure (!) 102/59, pulse 70, temperature 98.6 F (37 C), temperature source Oral, resp. rate 20, height 5\' 6"  (1.676 m), weight 77.1 kg, SpO2 99 %. Body mass index is 27.44 kg/m. General: Well developed, well nourished, in no acute distress. Head: Normocephalic, atraumatic, sclera non-icteric, no xanthomas, nares are without discharge. Neck: No apparent masses Lungs: Normal respirations with no wheezes, some rhonchi, no rales , few crackles   Heart: Regular rate and rhythm, normal S1 S2, no murmur, no rub, no gallop, PMI is normal size and placement, carotid upstroke normal without bruit, jugular venous pressure normal Abdomen: Soft, non-tender, non-distended with normoactive bowel sounds. No hepatosplenomegaly. Abdominal aorta is normal size without  bruit Extremities: 2+ edema, no clubbing, no cyanosis, no ulcers,  Peripheral: 2+ radial, 2+ femoral, 2+ dorsal pedal pulses Neuro: Alert and oriented. Moves all extremities spontaneously. Psych:  Responds to questions appropriately with a normal affect.   Intake/Output Summary (Last 24 hours) at 04/15/2019 0619 Last data filed at 04/15/2019 0419 Gross per 24 hour  Intake 720 ml  Output 2200 ml  Net -1480 ml    Inpatient Medications:  . docusate sodium  100 mg Oral BID  . feeding supplement (ENSURE ENLIVE)  237 mL Oral BID BM  . furosemide  60 mg Intravenous BID  . ipratropium-albuterol  3 mL Nebulization TID  . latanoprost  1 drop Both Eyes QHS  . levothyroxine  88 mcg Oral Q0600  . multivitamin with minerals  1 tablet Oral Daily  . polyethylene glycol  17 g Oral Daily  . potassium chloride SA  10 mEq Oral Daily  . sodium chloride flush  3 mL Intravenous Q12H  . Warfarin - Pharmacist Dosing Inpatient   Does not apply q1800   Infusions:  . sodium chloride      Labs: Recent Labs    04/13/19 0634 04/14/19 0537  NA 138 135  K 4.0 4.1  CL 101 99  CO2 28 28  GLUCOSE 95 100*  BUN 29* 26*  CREATININE 1.12 1.07  CALCIUM 8.6* 8.7*   No results for input(s): AST, ALT, ALKPHOS, BILITOT, PROT, ALBUMIN in the last 72 hours. Recent Labs    04/14/19 0537  WBC 4.2  HGB 13.2  HCT 39.4  MCV 108.2*  PLT 114*   No results for input(s): CKTOTAL, CKMB, TROPONINI in the last 72 hours. Invalid input(s): POCBNP No results for input(s): HGBA1C in the last 72 hours.   Weights: Filed Weights   04/13/19 0314 04/14/19 0358 04/15/19 0420  Weight:  77 kg 78.9 kg 77.1 kg     Radiology/Studies:  Dg Chest Port 1 View  Result Date: 04/11/2019 CLINICAL DATA:  Leg and ankle swelling, low blood pressure EXAM: PORTABLE CHEST 1 VIEW COMPARISON:  10/13/2018 FINDINGS: Gross cardiomegaly. Subtle heterogeneous opacity of the right lung base with a probable small layering pleural effusion. The  visualized skeletal structures are unremarkable. IMPRESSION: Gross cardiomegaly. Subtle heterogeneous opacity of the right lung base with a probable small layering pleural effusion, concerning for infection or aspiration. Electronically Signed   By: Eddie Candle M.D.   On: 04/11/2019 16:59     Assessment and Recommendation  85 y.o. male with known permanent atrial fibrillation status post pacemaker placement currently stable with elevation of troponin consistent with demand ischemia and acute on chronic systolic dysfunction heart failure mainly lower extremity edema and venous insufficiency continuing to need further treatment 1.  Continue following telemetry for heart rate control of sick sinus syndrome status post pacemaker placement although has been very stable since admission 2.  Warfarin for further risk reduction in stroke with atrial fibrillation with controlled her rate at this time 3.  Furosemide for lower extremity edema and continue treatment for heart failure and changing to oral medication management today 4.  Compression hose Unna boots elevation of feet and other mechanical treatments of severe lower extremity edema 5.  No further cardiac diagnostics necessary at this time 6.  Begin ambulation and follow for improvements and okay for discharge home from cardiac standpoint with follow-up next week for further adjustments 7.  Call if further questions  Signed, Serafina Royals M.D. FACC

## 2019-04-15 NOTE — Progress Notes (Signed)
[facility] for warfarin dosing/monitoring Indication: atrial fibrillation  No Known Allergies  Patient Measurements: Height: 5\' 6"  (167.6 cm) Weight: 170 lb (77.1 kg) IBW/kg (Calculated) : 63.8  Vital Signs: Temp: 98.1 F (36.7 C) (08/30 0731) Temp Source: Oral (08/30 0731) BP: 104/62 (08/30 0731) Pulse Rate: 73 (08/30 0731)  Labs: Recent Labs    04/13/19 0634 04/14/19 0537 04/15/19 0553  HGB  --  13.2  --   HCT  --  39.4  --   PLT  --  114*  --   LABPROT 32.0* 24.8* 24.5*  INR 3.2* 2.3* 2.2*  CREATININE 1.12 1.07 0.91    Estimated Creatinine Clearance: 53.8 mL/min (by C-G formula based on SCr of 0.91 mg/dL).   Medical History: Past Medical History:  Diagnosis Date  . Anemia   . Atrial fibrillation ([place])   . COPD (chronic obstructive pulmonary disease) ([place])   . GERD (gastroesophageal reflux disease)   . Glaucoma   . Hyperlipemia   . Hypertension   . Hypothyroidism   . Irregular heart rhythm   . Left anterior fascicular block   . MGUS (monoclonal gammopathy of unknown significance)   . RBBB     Medications:  Scheduled:  . docusate sodium  100 mg Oral BID  . feeding supplement (ENSURE ENLIVE)  237 mL Oral BID BM  . furosemide  60 mg Intravenous BID  . ipratropium-albuterol  3 mL Nebulization BID  . latanoprost  1 drop Both Eyes QHS  . levothyroxine  88 mcg Oral Q0600  . multivitamin with minerals  1 tablet Oral Daily  . polyethylene glycol  17 g Oral Daily  . potassium chloride SA  10 mEq Oral Daily  . sodium chloride flush  3 mL Intravenous Q12H  . Warfarin - Pharmacist Dosing Inpatient   Does not apply q1800    Assessment: Patient admitted x hypotension and leg swelling w/ h/o afib s/p PM w/ RBBB and EKG showing accelerated rhythm w/ PVCs, DM, hypothyroidism w/ TSH of 16 08/26, w/ worsening bilateral LE edema w/ hypotension leading to his losartan and hctz being discontinued w/ worsening leg swelling. Patient  is anticoagulated on warfarin 6.5 mg daily PTA for afib. No major DDI. Pt has HFrEF which can increase INR.    Date INR Warfarin Dose  8/26 3.3 None  8/27 2.9 2 mg  8/28 3.2 Hold  8/29 2.3 2 mg  8/30 2.2     Goal of Therapy:  INR 2-3 Monitor platelets by anticoagulation protocol: Yes   Plan:  INR is therapeutic. Will order 2 mg warfarin x 1 again tonight. Daily INR ordered.  Chinita Greenland PharmD Clinical Pharmacist 04/15/2019

## 2019-04-15 NOTE — Progress Notes (Signed)
Gabriel Delacruz at [place] NAME: Gabriel Delacruz    MR#:  [id number]  DATE OF BIRTH:  1927/11/07  SUBJECTIVE:  CHIEF COMPLAINT:   Chief Complaint  Patient presents with  . Hypotension  . Leg Swelling  No shortness of breath or chest pain.  Does have left knee welling secondary to arthritis. REVIEW OF SYSTEMS:    Review of Systems  Constitutional: Negative for chills and fever.  HENT: Negative for hearing loss.   Eyes: Negative for blurred vision, double vision and photophobia.  Respiratory: Negative for cough, hemoptysis and shortness of breath.   Cardiovascular: Negative for palpitations, orthopnea and leg swelling.  Gastrointestinal: Negative for abdominal pain, diarrhea and vomiting.  Genitourinary: Negative for dysuria and urgency.  Musculoskeletal: Positive for joint pain. Negative for myalgias and neck pain.  Skin: Negative for rash.  Neurological: Negative for dizziness, focal weakness, seizures, weakness and headaches.  Psychiatric/Behavioral: Negative for memory loss. The patient does not have insomnia.     CONSTITUTIONAL: No documented fever. No fatigue, weakness. No weight gain, no weight loss.  EYES: No blurry or double vision.  ENT: No tinnitus. No postnasal drip. No redness of the oropharynx.  RESPIRATORY: No cough, no wheeze, no hemoptysis. Has dyspnea.  CARDIOVASCULAR: No chest pain. Has orthopnea. No palpitations. No syncope.  GASTROINTESTINAL: No nausea, no vomiting or diarrhea. No abdominal pain. No melena or hematochezia.  GENITOURINARY: No dysuria or hematuria.  ENDOCRINE: No polyuria or nocturia. No heat or cold intolerance.  HEMATOLOGY: No anemia. No bruising. No bleeding.  INTEGUMENTARY: No rashes. No lesions.  MUSCULOSKELETAL: Arthritis, left knee swelling nEUROLOGIC: No numbness, tingling, or ataxia. No seizure-type activity.  PSYCHIATRIC: No anxiety. No insomnia. No ADD.   DRUG ALLERGIES:  No Known  Allergies  VITALS:  Blood pressure 104/62, pulse 73, temperature 98.1 F (36.7 C), temperature source Oral, resp. rate 18, height 5\' 6"  (1.676 m), weight 77.1 kg, SpO2 99 %.  PHYSICAL EXAMINATION:   Physical Exam  GENERAL:  85 y.o.-year-old patient lying in the bed with no acute distress.  EYES: Pupils equal, round, reactive to light and accommodation. No scleral icterus. Extraocular muscles intact.  HEENT: Head atraumatic, normocephalic. Oropharynx and nasopharynx clear.  NECK:  Supple, no jugular venous distention. No thyroid enlargement, no tenderness.  LUNGS: Improved breath sounds bilaterally, no crepitations today. CARDIOVASCULAR: S1, S2 iregular.. No murmurs, rubs, or gallops.  ABDOMEN: Soft, nontender, nondistended. Bowel sounds present. No organomegaly or mass.  EXTREMITIES: No cyanosis, clubbing  has edema b/l.    NEUROLOGIC: Cranial nerves II through XII are intact. No focal Motor or sensory deficits b/l.   PSYCHIATRIC: The patient is alert and oriented x 3.  SKIN: No obvious rash, lesion, or ulcer.   LABORATORY PANEL:   CBC Recent Labs  Lab 04/14/19 0537  WBC 4.2  HGB 13.2  HCT 39.4  PLT 114*   ------------------------------------------------------------------------------------------------------------------ Chemistries  Recent Labs  Lab 04/11/19 1824  04/15/19 0553  NA 138   < > 135  K 3.6   < > 4.0  CL 103   < > 96*  CO2 23   < > 29  GLUCOSE 90   < > 105*  BUN 36*   < > 27*  CREATININE 1.02   < > 0.91  CALCIUM 9.0   < > 8.8*  AST 24  --   --   ALT 21  --   --   ALKPHOS 56  --   --  BILITOT 1.5*  --   --    < > = values in this interval not displayed.   ------------------------------------------------------------------------------------------------------------------  Cardiac Enzymes No results for input(s): TROPONINI in the last 168  hours. ------------------------------------------------------------------------------------------------------------------  RADIOLOGY:  No results found.   ASSESSMENT AND PLAN:  85 year old male patient with history of chronic atrial fibrillation, COPD, GERD, hyperlipidemia, hypertension, monoclonal gammopathy, hypothyroidism currently under hospitalist service  -New onset congestive heart failure Continue oral Lasix, stop IV Lasix. Echocardiogram shows reduced systolic function with EF of 25 to 30% Monitor electrolytes Slightly elevated troponin secondary to demand ischemia. Change to oral diuretics today.  Daily body weights and input / output chart  -Chronic atrial fibrillation Continue anticoagulation with Coumadin Rate well controlled INR therapeutic, seen by Dr. Nehemiah Delacruz from cardiology, status post pacemaker for atrial fibrillation.  -COPD appears stable Home dose inhalers  -DVT prophylaxis On Coumadin  -Ambulatory dysfunction Physical therapy recommends skilled nursing facility.   waiting for insurance approval.  Spoke with patient's daughter yesterday. History of arthritis, left knee swelling can use ice as needed.   All the records are reviewed and case discussed with Care Management/Social Worker. Management plans discussed with the patient, family and they are in agreement.  CODE STATUS:Full code  DVT Prophylaxis: SCDs  TOTAL TIME TAKING CARE OF THIS PATIENT: 38 minutes.  More than 50% time spent in counseling, coordination of care POSSIBLE D/C IN 2 to 3 DAYS, DEPENDING ON CLINICAL CONDITION.  Gabriel Delacruz M.D on 04/15/2019 at 11:56 AM  Between 7am to 6pm - Pager - [phone]  After 6pm go to [email or web address] - password [email or web address] [facility]  Office  [phone]  CC: Primary care physician; Gabriel Elk, MD  Note: This dictation was prepared with Dragon dictation along with smaller phrase technology. Any  transcriptional errors that result from this process are unintentional.

## 2019-04-16 LAB — BASIC METABOLIC PANEL
Anion gap: 7 (ref 5–15)
BUN: 26 mg/dL — ABNORMAL HIGH (ref 8–23)
CO2: 30 mmol/L (ref 22–32)
Calcium: 8.9 mg/dL (ref 8.9–10.3)
Chloride: 96 mmol/L — ABNORMAL LOW (ref 98–111)
Creatinine, Ser: 0.95 mg/dL (ref 0.61–1.24)
GFR calc Af Amer: >60 mL/min (ref >60)
GFR calc non Af Amer: >60 mL/min (ref >60)
Glucose, Bld: 97 mg/dL (ref 70–99)
Potassium: 4.3 mmol/L (ref 3.5–5.1)
Sodium: 133 mmol/L — ABNORMAL LOW (ref 135–145)

## 2019-04-16 LAB — PROTIME-INR
INR: 1.8 — ABNORMAL HIGH (ref 0.8–1.2)
Prothrombin Time: 20.5 seconds — ABNORMAL HIGH (ref 11.4–15.2)

## 2019-04-16 MED ORDER — FUROSEMIDE 20 MG PO TABS
20.0000 mg | ORAL_TABLET | Freq: Every day | ORAL | Status: DC
  Administered 2019-04-17 – 2019-04-21 (×5): 20 mg via ORAL
  Filled 2019-04-16 (×5): qty 1

## 2019-04-16 MED ORDER — WARFARIN SODIUM 2.5 MG PO TABS
2.5000 mg | ORAL_TABLET | Freq: Once | ORAL | Status: AC
  Administered 2019-04-16: 2.5 mg via ORAL
  Filled 2019-04-16: qty 1

## 2019-04-16 NOTE — Progress Notes (Addendum)
[place] for warfarin dosing/monitoring Indication: atrial fibrillation  No Known Allergies  Patient Measurements: Height: 5\' 6"  (167.6 cm) Weight: 175 lb 1.6 oz (79.4 kg) IBW/kg (Calculated) : 63.8  Vital Signs: Temp: 98.6 F (37 C) (08/31 0414) Temp Source: Oral (08/31 0414) BP: 100/62 (08/31 0931) Pulse Rate: 73 (08/31 0931)  Labs: Recent Labs    04/14/19 0537 04/15/19 0553 04/16/19 0723  HGB 13.2  --   --   HCT 39.4  --   --   PLT 114*  --   --   LABPROT 24.8* 24.5* 20.5*  INR 2.3* 2.2* 1.8*  CREATININE 1.07 0.91 0.95    Estimated Creatinine Clearance: 52.2 mL/min (by C-G formula based on SCr of 0.95 mg/dL).   Medical History: Past Medical History:  Diagnosis Date  . Anemia   . Atrial fibrillation ([place])   . COPD (chronic obstructive pulmonary disease) ([place])   . GERD (gastroesophageal reflux disease)   . Glaucoma   . Hyperlipemia   . Hypertension   . Hypothyroidism   . Irregular heart rhythm   . Left anterior fascicular block   . MGUS (monoclonal gammopathy of unknown significance)   . RBBB     Medications:  Scheduled:  . docusate sodium  100 mg Oral BID  . feeding supplement (ENSURE ENLIVE)  237 mL Oral BID BM  . ipratropium-albuterol  3 mL Nebulization BID  . latanoprost  1 drop Both Eyes QHS  . levothyroxine  88 mcg Oral Q0600  . multivitamin with minerals  1 tablet Oral Daily  . polyethylene glycol  17 g Oral Daily  . potassium chloride SA  10 mEq Oral Daily  . sodium chloride flush  3 mL Intravenous Q12H  . Warfarin - Pharmacist Dosing Inpatient   Does not apply q1800    Assessment: Patient admitted x hypotension and leg swelling w/ h/o afib s/p PM w/ RBBB and EKG showing accelerated rhythm w/ PVCs, DM, hypothyroidism w/ TSH of 16 08/26, w/ worsening bilateral LE edema w/ hypotension leading to his losartan and hctz being discontinued w/ worsening leg swelling. Patient is anticoagulated on warfarin 6.5  mg daily PTA for afib. No major DDI. Pt has HFrEF which can increase INR.   Today's INR is subtherapeutic.    Date INR Warfarin Dose  8/26 3.3 None  8/27 2.9 2 mg  8/28 3.2 Hold  8/29 2.3 2 mg  8/30 2.2 2 mg  8/31 1.8          Goal of Therapy:  INR 2-3 Monitor platelets by anticoagulation protocol: Yes   Plan:  INR is subtherapeutic. Will order 2.5 mg warfarin x 1 again tonight. Daily INR ordered.  Kristeen Miss, PharmD Clinical Pharmacist 04/16/2019

## 2019-04-16 NOTE — Care Management Important Message (Signed)
Important Message  Patient Details  Name: Demitris Nihill MRN: [id number] Date of Birth: 02-27-1928   Medicare Important Message Given:  Yes     [name] 04/16/2019, 11:52 AM

## 2019-04-16 NOTE — Plan of Care (Signed)
  Problem: Education: Goal: Knowledge of General Education information will improve Description: Including pain rating scale, medication(s)/side effects and non-pharmacologic comfort measures Outcome: Progressing   Problem: Health Behavior/Discharge Planning: Goal: Ability to manage health-related needs will improve Outcome: Progressing Note: Patient has been transitioned to PO Lasix, previously on 80 of IV at least BID. Awaiting insurance approval to go to a SNF. Will continue to monitor d/c progression. Wenda Low [facility]

## 2019-04-16 NOTE — TOC Progression Note (Signed)
Transition of Care [facility]) - Progression Note    Patient Details  Name: Gabriel Delacruz MRN: [id number] Date of Birth: Nov 10, 1927  Transition of Care [facility]) CM/SW Contact  Ross Ludwig, [place] Phone Number: 04/16/2019, 5:00 PM  Clinical Narrative:     CSW spoke to patient and his daughter, they have decided they would like to try [facility] instead.  CSW contacted [facility] and they can still accept patient, they will start insurance authorization through [place].  CSW to continue to follow patient's progress throughout discharge planning.   Expected Discharge Plan: [place] Barriers to Discharge: Continued Medical Work up  Expected Discharge Plan and Services Expected Discharge Plan: [place] In-house Referral: Clinical Social Work     Living arrangements for the past 2 months: Single Family Home                                       Social Determinants of Health (SDOH) Interventions    Readmission Risk Interventions Readmission Risk Prevention Plan 04/12/2019  Medication Screening Complete  Transportation Screening Complete  Some recent data might be hidden

## 2019-04-16 NOTE — Progress Notes (Signed)
Physical Therapy Treatment Patient Details Name: Gabriel Delacruz MRN: [id number] DOB: Nov 10, 1927 Today's Date: 04/16/2019    History of Present Illness 85 year old male patient with history of chronic atrial fibrillation, COPD, GERD, hyperlipidemia, hypertension, monoclonal gammopathy, hypothyroidism admitted for hypotension, leg swelling, work up showed new onset congestive heart failure    PT Comments    Pt more alert today and reporting less knee pain. He was able to perform all supine and seated there ex with VC's for form and posture only and open to education about benefit.  Pt able to scoot along EOB bilaterally and clear bed each time to strengthen LE's for STS.  He attempted STS 4x with Max A to avoid R lateral lean and to rise from bedside.  Partial STS only achieved each time.  He reported L knee pain when attempting WB and was unable to successfully perform gradual wt shift to L side.  Pt able to return to bed and scoot up in bed for positioning with VC's only.  Pt will continue to benefit from skilled PT with focus on strength, tolerance to activity, safe functional mobility and management of knee pain.  Follow Up Recommendations  SNF     Equipment Recommendations  None recommended by PT;Other (comment)(Pt has RW, standard walker, cane at home)    Recommendations for Other Services       Precautions / Restrictions Precautions Precautions: Fall    Mobility  Bed Mobility Overal bed mobility: Needs Assistance Bed Mobility: Supine to Sit;Sit to Supine     Supine to sit: Min assist Sit to supine: Min guard   General bed mobility comments: Assistance to initiate sup to sit.  Pt able to scoot up in bed with VC's only.  Transfers Overall transfer level: Needs assistance Equipment used: Rolling walker (2 wheeled) Transfers: Sit to/from Stand Sit to Stand: Max assist         General transfer comment: Assistance to rise from bedside and avoid R lateral lean which  pt did heavily each time.  Pt is aware that he is doing this and cites L knee pain.  He was able to [facility] and accomplish partial STS each time but never able to stand upright.  Ambulation/Gait                 Stairs             Wheelchair Mobility    Modified Rankin (Stroke Patients Only)       Balance Overall balance assessment: Needs assistance Sitting-balance support: Feet supported Sitting balance-Leahy Scale: Fair       Standing balance-Leahy Scale: Poor Standing balance comment: Heavily reliant on RW with R lateral lean; able to wt shift minimally to L side with VC's.                            Cognition Arousal/Alertness: Awake/alert Behavior During Therapy: WFL for tasks assessed/performed Overall Cognitive Status: No family/caregiver present to determine baseline cognitive functioning                                 General Comments: Pt more alert today an follows directions consistently.      Exercises General Exercises - Lower Extremity Quad Sets: Left;10 reps;Supine Long Arc Quad: Strengthening;Both;10 reps;Seated Heel Slides: Left;10 reps;Supine;Strengthening Hip ABduction/ADduction: Strengthening;Left;10 reps;Supine(seated pillow squeeze x10 with VC's to sit upright without use  of UE's.) Hip Flexion/Marching: Strengthening;20 reps;Seated;Both Other Exercises Other Exercises: Scooting along EOB to L and R side 4x with VC's for hand and foot placement.  Pt able to clear bottom from bed each time he scooted. Other Exercises: Time to educate pt about scheduling HEP ther ex and benefit to knee pain.  x4 min    General Comments        Pertinent Vitals/Pain Pain Assessment: Faces Faces Pain Scale: Hurts a little bit Pain Location: L knee; stated knee pain is "better than the other day". Pain Intervention(s): Monitored during session    Home Living                      Prior Function            PT Goals  (current goals can now be found in the care plan section) Acute Rehab PT Goals Patient Stated Goal: to go home PT Goal Formulation: With patient Time For Goal Achievement: 04/27/19 Potential to Achieve Goals: Good Progress towards PT goals: Progressing toward goals    Frequency    Min 2X/week      PT Plan Current plan remains appropriate    Co-evaluation              AM-PAC PT "6 Clicks" Mobility   Outcome Measure  Help needed turning from your back to your side while in a flat bed without using bedrails?: A Little Help needed moving from lying on your back to sitting on the side of a flat bed without using bedrails?: A Little Help needed moving to and from a bed to a chair (including a wheelchair)?: A Lot Help needed standing up from a chair using your arms (e.g., wheelchair or bedside chair)?: A Lot Help needed to walk in hospital room?: A Lot Help needed climbing 3-5 steps with a railing? : A Lot 6 Click Score: 14    End of Session Equipment Utilized During Treatment: Gait belt Activity Tolerance: Patient limited by fatigue;Patient limited by pain(L knee pain.) Patient left: in bed;with bed alarm set;with call bell/phone within reach Nurse Communication: (IV in R hand appeared to have dried blood and also blood on pt's bedsheets in area where R UE rests.) PT Visit Diagnosis: Other abnormalities of gait and mobility (R26.89);Muscle weakness (generalized) (M62.81);Difficulty in walking, not elsewhere classified (R26.2)     Time: [id number] PT Time Calculation (min) (ACUTE ONLY): 24 min  Charges:  $Therapeutic Exercise: 8-22 mins $Therapeutic Activity: 8-22 mins                     Roxanne Gates, PT, DPT    Roxanne Gates 04/16/2019, 2:33 PM

## 2019-04-16 NOTE — Progress Notes (Signed)
[place] at [place] NAME: Kestin Conatser    MR#:  [id number]  DATE OF BIRTH:  02/24/1928  SUBJECTIVE:  CHIEF COMPLAINT:   Chief Complaint  Patient presents with  . Hypotension  . Leg Swelling  No complaints, confused at times.  Hypotensive. REVIEW OF SYSTEMS:    Review of Systems  Constitutional: Negative for chills and fever.  HENT: Negative for hearing loss.   Eyes: Negative for blurred vision, double vision and photophobia.  Respiratory: Negative for cough, hemoptysis and shortness of breath.   Cardiovascular: Negative for palpitations, orthopnea and leg swelling.  Gastrointestinal: Negative for abdominal pain, diarrhea and vomiting.  Genitourinary: Negative for dysuria and urgency.  Musculoskeletal: Positive for joint pain. Negative for myalgias and neck pain.  Skin: Negative for rash.  Neurological: Negative for dizziness, focal weakness, seizures, weakness and headaches.  Psychiatric/Behavioral: Negative for memory loss. The patient does not have insomnia.     CONSTITUTIONAL: No documented fever. No fatigue, weakness. No weight gain, no weight loss.  EYES: No blurry or double vision.  ENT: No tinnitus. No postnasal drip. No redness of the oropharynx.  RESPIRATORY: No cough, no wheeze, no hemoptysis. Has dyspnea.  CARDIOVASCULAR: No chest pain. Has orthopnea. No palpitations. No syncope.  GASTROINTESTINAL: No nausea, no vomiting or diarrhea. No abdominal pain. No melena or hematochezia.  GENITOURINARY: No dysuria or hematuria.  ENDOCRINE: No polyuria or nocturia. No heat or cold intolerance.  HEMATOLOGY: No anemia. No bruising. No bleeding.  INTEGUMENTARY: No rashes. No lesions.  MUSCULOSKELETAL: Arthritis, left knee swelling nEUROLOGIC: No numbness, tingling, or ataxia. No seizure-type activity.  PSYCHIATRIC: No anxiety. No insomnia. No ADD.   DRUG ALLERGIES:  No Known Allergies  VITALS:  Blood pressure 100/62, pulse 73,  temperature 98.6 F (37 C), temperature source Oral, resp. rate 19, height 5\' 6"  (1.676 m), weight 77.1 kg, SpO2 98 %.  PHYSICAL EXAMINATION:   Physical Exam  GENERAL:  85 y.o.-year-old patient lying in the bed with no acute distress.  EYES: Pupils equal, round, reactive to light and accommodation. No scleral icterus. Extraocular muscles intact.  HEENT: Head atraumatic, normocephalic. Oropharynx and nasopharynx clear.  NECK:  Supple, no jugular venous distention. No thyroid enlargement, no tenderness.  LUNGS: Improved breath sounds bilaterally, no crepitations today. CARDIOVASCULAR: S1, S2 iregular.. No murmurs, rubs, or gallops.  ABDOMEN: Soft, nontender, nondistended. Bowel sounds present. No organomegaly or mass.  EXTREMITIES: No cyanosis, clubbing  has edema b/l.    NEUROLOGIC: Cranial nerves II through XII are intact. No focal Motor or sensory deficits b/l.   PSYCHIATRIC: The patient is alert and oriented x 3.  SKIN: No obvious rash, lesion, or ulcer.   LABORATORY PANEL:   CBC Recent Labs  Lab 04/14/19 0537  WBC 4.2  HGB 13.2  HCT 39.4  PLT 114*   ------------------------------------------------------------------------------------------------------------------ Chemistries  Recent Labs  Lab 04/11/19 1824  04/16/19 0723  NA 138   < > 133*  K 3.6   < > 4.3  CL 103   < > 96*  CO2 23   < > 30  GLUCOSE 90   < > 97  BUN 36*   < > 26*  CREATININE 1.02   < > 0.95  CALCIUM 9.0   < > 8.9  AST 24  --   --   ALT 21  --   --   ALKPHOS 56  --   --   BILITOT 1.5*  --   --    < > =  values in this interval not displayed.   ------------------------------------------------------------------------------------------------------------------  Cardiac Enzymes No results for input(s): TROPONINI in the last 168 hours. ------------------------------------------------------------------------------------------------------------------  RADIOLOGY:  No results found.   ASSESSMENT AND  PLAN:  86 year old male patient with history of chronic atrial fibrillation, COPD, GERD, hyperlipidemia, hypertension, monoclonal gammopathy, hypothyroidism currently under hospitalist service  -New onset congestive heart failure Hold diuretics due to hypotension. Echocardiogram shows reduced systolic function with EF of 25 to 30% Monitor electrolytes Slightly elevated troponin secondary to demand ischemia. Appreciate cardiology following..  Daily body weights and input / output chart  -Chronic atrial fibrillation Continue anticoagulation with Coumadin Rate well controlled INR therapeutic, seen by Dr. Nehemiah Massed from cardiology, status post pacemaker for atrial fibrillation.  -COPD appears stable Home dose inhalers  -DVT prophylaxis On Coumadin  -Ambulatory dysfunction Physical therapy recommends skilled nursing facility.   waiting for insurance approval.   History of arthritis, left knee swelling can use ice as needed.   All the records are reviewed and case discussed with Care Management/Social Worker. Management plans discussed with the patient, family and they are in agreement.  CODE STATUS:Full code  DVT Prophylaxis: SCDs  TOTAL TIME TAKING CARE OF THIS PATIENT: 38 minutes.  More than 50% time spent in counseling, coordination of care POSSIBLE D/C IN 2 to 3 DAYS, DEPENDING ON CLINICAL CONDITION.  Epifanio Lesches M.D on 04/16/2019 at 1:41 PM  Between 7am to 6pm - Pager - [phone]  After 6pm go to [email or web address] - password [email or web address] [email or web address]  Office  [phone]  CC: Primary care physician; Marinda Elk, MD  Note: This dictation was prepared with Dragon dictation along with smaller phrase technology. Any transcriptional errors that result from this process are unintentional.

## 2019-04-17 LAB — PROTIME-INR
INR: 1.6 — ABNORMAL HIGH (ref 0.8–1.2)
Prothrombin Time: 18.8 seconds — ABNORMAL HIGH (ref 11.4–15.2)

## 2019-04-17 LAB — CBC
HCT: 36.4 % — ABNORMAL LOW (ref 39.0–52.0)
Hemoglobin: 11.9 g/dL — ABNORMAL LOW (ref 13.0–17.0)
MCH: 35.1 pg — ABNORMAL HIGH (ref 26.0–34.0)
MCHC: 32.7 g/dL (ref 30.0–36.0)
MCV: 107.4 fL — ABNORMAL HIGH (ref 80.0–100.0)
Platelets: 114 10*3/uL — ABNORMAL LOW (ref 150–400)
RBC: 3.39 MIL/uL — ABNORMAL LOW (ref 4.22–5.81)
RDW: 13.5 % (ref 11.5–15.5)
WBC: 4.4 10*3/uL (ref 4.0–10.5)
nRBC: 0 % (ref 0.0–0.2)

## 2019-04-17 MED ORDER — WARFARIN SODIUM 3 MG PO TABS
3.0000 mg | ORAL_TABLET | Freq: Once | ORAL | Status: AC
  Administered 2019-04-17: 3 mg via ORAL
  Filled 2019-04-17: qty 1

## 2019-04-17 NOTE — Progress Notes (Addendum)
[place] for warfarin dosing/monitoring Indication: atrial fibrillation  No Known Allergies  Patient Measurements: Height: 5\' 6"  (167.6 cm) Weight: 171 lb 4.8 oz (77.7 kg) IBW/kg (Calculated) : 63.8  Vital Signs: Temp: 98.8 F (37.1 C) (09/01 0510) Temp Source: Oral (09/01 0510) BP: 91/66 (09/01 0510) Pulse Rate: 74 (09/01 0510)  Labs: Recent Labs    04/15/19 0553 04/16/19 0723 04/17/19 0504  HGB  --   --  11.9*  HCT  --   --  36.4*  PLT  --   --  114*  LABPROT 24.5* 20.5* 18.8*  INR 2.2* 1.8* 1.6*  CREATININE 0.91 0.95  --     Estimated Creatinine Clearance: 51.7 mL/min (by C-G formula based on SCr of 0.95 mg/dL).   Medical History: Past Medical History:  Diagnosis Date  . Anemia   . Atrial fibrillation ([place])   . COPD (chronic obstructive pulmonary disease) ([place])   . GERD (gastroesophageal reflux disease)   . Glaucoma   . Hyperlipemia   . Hypertension   . Hypothyroidism   . Irregular heart rhythm   . Left anterior fascicular block   . MGUS (monoclonal gammopathy of unknown significance)   . RBBB     Medications:  Scheduled:  . docusate sodium  100 mg Oral BID  . feeding supplement (ENSURE ENLIVE)  237 mL Oral BID BM  . furosemide  20 mg Oral Daily  . ipratropium-albuterol  3 mL Nebulization BID  . latanoprost  1 drop Both Eyes QHS  . levothyroxine  88 mcg Oral Q0600  . multivitamin with minerals  1 tablet Oral Daily  . polyethylene glycol  17 g Oral Daily  . potassium chloride SA  10 mEq Oral Daily  . sodium chloride flush  3 mL Intravenous Q12H  . Warfarin - Pharmacist Dosing Inpatient   Does not apply q1800    Assessment: Patient admitted x hypotension and leg swelling w/ h/o afib s/p PM w/ RBBB and EKG showing accelerated rhythm w/ PVCs, DM, hypothyroidism w/ TSH of 16 08/26, w/ worsening bilateral LE edema w/ hypotension leading to his losartan and hctz being discontinued w/ worsening leg swelling.  Patient is anticoagulated on warfarin 6.5 mg daily PTA for afib. No major DDI. Pt has HFrEF which can increase INR.   Today's INR is subtherapeutic.    Date INR Warfarin Dose  8/26 3.3 None  8/27 2.9 2 mg  8/28 3.2 Hold  8/29 2.3 2 mg  8/30 2.2 2 mg  8/31 1.8 2.5 mg   9/01 1.6      Goal of Therapy:  INR 2-3 Monitor platelets by anticoagulation protocol: Yes   Plan:  Will order 3 mg warfarin x 1 tonight. Daily INR ordered.  Kristeen Miss, PharmD Clinical Pharmacist 04/17/2019

## 2019-04-17 NOTE — Plan of Care (Signed)

## 2019-04-17 NOTE — Progress Notes (Signed)
[facility] NAME: Kwon Verdun    MR#:  [id number]  DATE OF BIRTH:  [birth year]  SUBJECTIVE:  CHIEF COMPLAINT:   Chief Complaint  Patient presents with  . Hypotension  . Leg Swelling  Confused at times  but does not appear to be in distress REVIEW OF SYSTEMS:    Review of Systems  Constitutional: Negative for chills and fever.  HENT: Negative for hearing loss.   Eyes: Negative for blurred vision, double vision and photophobia.  Respiratory: Negative for cough, hemoptysis and shortness of breath.   Cardiovascular: Negative for palpitations, orthopnea and leg swelling.  Gastrointestinal: Negative for abdominal pain, diarrhea and vomiting.  Genitourinary: Negative for dysuria and urgency.  Musculoskeletal: Positive for joint pain. Negative for myalgias and neck pain.  Skin: Negative for rash.  Neurological: Negative for dizziness, focal weakness, seizures, weakness and headaches.  Psychiatric/Behavioral: Negative for memory loss. The patient does not have insomnia.       DRUG ALLERGIES:  No Known Allergies  VITALS:  Blood pressure 110/83, pulse 76, temperature 98.4 F (36.9 C), temperature source Oral, resp. rate 18, height 5\' 6"  (1.676 m), weight 77.7 kg, SpO2 98 %.  PHYSICAL EXAMINATION:   Physical Exam  GENERAL:  85 y.o.-year-old patient lying in the bed with no acute distress.  EYES: Pupils equal, round, reactive to light and accommodation. No scleral icterus. Extraocular muscles intact.  HEENT: Head atraumatic, normocephalic. Oropharynx and nasopharynx clear.  NECK:  Supple, no jugular venous distention. No thyroid enlargement, no tenderness.  LUNGS: Improved breath sounds bilaterally, no crepitations today. CARDIOVASCULAR: S1, S2 iregular.. No murmurs, rubs, or gallops.  ABDOMEN: Soft, nontender, nondistended. Bowel sounds present. No organomegaly or mass.  EXTREMITIES: No cyanosis, clubbing  has edema b/l.     NEUROLOGIC: Cranial nerves II through XII are intact. No focal Motor or sensory deficits b/l.   PSYCHIATRIC: The patient is alert and oriented x 3.  SKIN: No obvious rash, lesion, or ulcer.   LABORATORY PANEL:   CBC Recent Labs  Lab 04/17/19 0504  WBC 4.4  HGB 11.9*  HCT 36.4*  PLT 114*   ------------------------------------------------------------------------------------------------------------------ Chemistries  Recent Labs  Lab 04/11/19 1824  04/16/19 0723  NA 138   < > 133*  K 3.6   < > 4.3  CL 103   < > 96*  CO2 23   < > 30  GLUCOSE 90   < > 97  BUN 36*   < > 26*  CREATININE 1.02   < > 0.95  CALCIUM 9.0   < > 8.9  AST 24  --   --   ALT 21  --   --   ALKPHOS 56  --   --   BILITOT 1.5*  --   --    < > = values in this interval not displayed.   ------------------------------------------------------------------------------------------------------------------  Cardiac Enzymes No results for input(s): TROPONINI in the last 168 hours. ------------------------------------------------------------------------------------------------------------------  RADIOLOGY:  No results found.   ASSESSMENT AND PLAN:  85 year old male patient with history of chronic atrial fibrillation, COPD, GERD, hyperlipidemia, hypertension, monoclonal gammopathy, hypothyroidism currently under hospitalist service  -New onset congestive heart failure On low-dose diuretics.   Echocardiogram shows reduced systolic function with EF of 25 to 30% Monitor electrolytes Slightly elevated troponin secondary to demand ischemia. Appreciate cardiology following..  Daily body weights and input / output chart  -Chronic atrial fibrillation Continue anticoagulation with Coumadin Rate well controlled INR  therapeutic, seen by Dr. Nehemiah Massed from cardiology, status post pacemaker for atrial fibrillation.  -COPD appears stable Home dose inhalers  -DVT prophylaxis On Coumadin  -Ambulatory  dysfunction Physical therapy recommends skilled nursing facility.   waiting for insurance approval.   History of arthritis, left knee swelling can use ice as needed.  COVID-19 test has been ordered  Today  as per discharge to nursing home protocol . All the records are reviewed and case discussed with Care Management/Social Worker. Management plans discussed with the patient, family and they are in agreement.  CODE STATUS:Full code  DVT Prophylaxis: SCDs  TOTAL TIME TAKING CARE OF THIS PATIENT: 38 minutes.  More than 50% time spent in counseling, coordination of care POSSIBLE D/C IN 2 to 3 DAYS, DEPENDING ON CLINICAL CONDITION.  Epifanio Lesches M.D on 04/17/2019 at 1:35 PM  Between 7am to 6pm - Pager - [phone]  After 6pm go to [email or web address] - password [email or web address] [facility]  Office  [phone]  CC: Primary care physician; Marinda Elk, MD  Note: This dictation was prepared with Dragon dictation along with smaller phrase technology. Any transcriptional errors that result from this process are unintentional.

## 2019-04-17 NOTE — Plan of Care (Signed)
  Problem: Clinical Measurements: Goal: Respiratory complications will improve Outcome: Progressing Note: On room air   Problem: Nutrition: Goal: Adequate nutrition will be maintained Outcome: Progressing

## 2019-04-17 NOTE — Progress Notes (Signed)
   04/17/19 1500  Clinical Encounter Type  Visited With Patient  Visit Type Initial  Referral From Social work  Consult/Referral To Chaplain  Spiritual Encounters  Spiritual Needs Brochure   Chaplain received a referral from CSW, Randall Hiss to provide information and education on the AD. Randall Hiss communicated that the patient's daughter planned to come back to pick the document up later this evening. Upon arrival, the patient was laying in bed with his head elevated. He was pleasant, welcoming and conversational. The patient indicated that he was interested in reviewing the document later with his daughter, so AD packet left on bedside table. Chaplain and patient engaged in brief conversation about the patient's health and the general state of affairs in the world. The patient appeared to be in no distress at this time and expressed appreciation for the visit.

## 2019-04-18 LAB — NOVEL CORONAVIRUS, NAA (HOSP ORDER, SEND-OUT TO REF LAB; TAT 18-24 HRS): SARS-CoV-2, NAA: NOT DETECTED

## 2019-04-18 LAB — PROTIME-INR
INR: 1.5 — ABNORMAL HIGH (ref 0.8–1.2)
Prothrombin Time: 17.9 seconds — ABNORMAL HIGH (ref 11.4–15.2)

## 2019-04-18 MED ORDER — WARFARIN SODIUM 2.5 MG PO TABS
5.0000 mg | ORAL_TABLET | Freq: Once | ORAL | Status: AC
  Administered 2019-04-18: 5 mg via ORAL
  Filled 2019-04-18: qty 2

## 2019-04-18 NOTE — TOC Progression Note (Signed)
Transition of Care [facility]) - Progression Note    Patient Details  Name: Gabriel Delacruz MRN: [id number] Date of Birth: [birth year]  Transition of [facility]) CM/SW [place], LCSW Phone Number: 04/18/2019, 3:37 PM  Clinical Narrative: [employer] authorization still pending.    Expected Discharge Plan: [place] Barriers to Discharge: Continued Medical Work up  Expected Discharge Plan and Services Expected Discharge Plan: [place] In-house Referral: Clinical Social Work     Living arrangements for the past 2 months: Single Family Home                                       Social Determinants of Health (SDOH) Interventions    Readmission Risk Interventions Readmission Risk Prevention Plan 04/12/2019  Medication Screening Complete  Transportation Screening Complete  Some recent data might be hidden

## 2019-04-18 NOTE — Progress Notes (Signed)
Gabriel Delacruz NAME: Gabriel Delacruz    MR#:  [id number]  DATE OF BIRTH:  [birth year]  SUBJECTIVE:  CHIEF COMPLAINT:   Chief Complaint  Patient presents with  . Hypotension  . Leg Swelling  Still waiting for rehab bed. REVIEW OF SYSTEMS:    Review of Systems  Constitutional: Negative for chills and fever.  HENT: Negative for hearing loss.   Eyes: Negative for blurred vision, double vision and photophobia.  Respiratory: Negative for cough, hemoptysis and shortness of breath.   Cardiovascular: Negative for palpitations, orthopnea and leg swelling.  Gastrointestinal: Negative for abdominal pain, diarrhea and vomiting.  Genitourinary: Negative for dysuria and urgency.  Musculoskeletal: Positive for joint pain. Negative for myalgias and neck pain.  Skin: Negative for rash.  Neurological: Negative for dizziness, focal weakness, seizures, weakness and headaches.  Psychiatric/Behavioral: Negative for memory loss. The patient does not have insomnia.       DRUG ALLERGIES:  No Known Allergies  VITALS:  Blood pressure 105/62, pulse 73, temperature 98.3 F (36.8 C), temperature source Oral, resp. rate 18, height 5\' 6"  (1.676 m), weight 77.7 kg, SpO2 100 %.  PHYSICAL EXAMINATION:   Physical Exam  GENERAL:  85 y.o.-year-old patient lying in the bed with no acute distress.  EYES: Pupils equal, round, reactive to light and accommodation. No scleral icterus. Extraocular muscles intact.  HEENT: Head atraumatic, normocephalic. Oropharynx and nasopharynx clear.  NECK:  Supple, no jugular venous distention. No thyroid enlargement, no tenderness.  LUNGS: Improved breath sounds bilaterally, no crepitations today. CARDIOVASCULAR: S1, S2 iregular.. No murmurs, rubs, or gallops.  ABDOMEN: Soft, nontender, nondistended. Bowel sounds present. No organomegaly or mass.  EXTREMITIES: No cyanosis, clubbing  has edema b/l.    NEUROLOGIC: Cranial nerves II  through XII are intact. No focal Motor or sensory deficits b/l.   PSYCHIATRIC: The patient is alert and oriented x 3.  SKIN: No obvious rash, lesion, or ulcer.   LABORATORY PANEL:   CBC Recent Labs  Lab 04/17/19 0504  WBC 4.4  HGB 11.9*  HCT 36.4*  PLT 114*   ------------------------------------------------------------------------------------------------------------------ Chemistries  Recent Labs  Lab 04/11/19 1824  04/16/19 0723  NA 138   < > 133*  K 3.6   < > 4.3  CL 103   < > 96*  CO2 23   < > 30  GLUCOSE 90   < > 97  BUN 36*   < > 26*  CREATININE 1.02   < > 0.95  CALCIUM 9.0   < > 8.9  AST 24  --   --   ALT 21  --   --   ALKPHOS 56  --   --   BILITOT 1.5*  --   --    < > = values in this interval not displayed.   ------------------------------------------------------------------------------------------------------------------  Cardiac Enzymes No results for input(s): TROPONINI in the last 168 hours. ------------------------------------------------------------------------------------------------------------------  RADIOLOGY:  No results found.   ASSESSMENT AND PLAN:  85 year old male patient with history of chronic atrial fibrillation, COPD, GERD, hyperlipidemia, hypertension, monoclonal gammopathy, hypothyroidism currently under hospitalist service  -New onset congestive heart failure On low-dose diuretics.   Echocardiogram shows reduced systolic function with EF of 25 to 30% Monitor electrolytes Slightly elevated troponin secondary to demand ischemia. Appreciate cardiology following..  Daily body weights and input / output chart  -Chronic atrial fibrillation Continue anticoagulation with Coumadin Rate well controlled INR therapeutic, seen by Dr. Nehemiah Delacruz from cardiology,  status post pacemaker for atrial fibrillation.  -COPD appears stable Home dose inhalers  -DVT prophylaxis On Coumadin  -Ambulatory dysfunction Physical therapy recommends  skilled nursing facility.   waiting for insurance approval.   History of arthritis, left knee swelling can use ice as needed.  COVID-19 test is sent out yesterday, results are still pending.  Waiting for insurance authorization also for him to go to rehab.   All the records are reviewed and case discussed with Care Management/Social Worker. Management plans discussed with the patient, family and they are in agreement.  CODE STATUS:Full code  DVT Prophylaxis: SCDs  TOTAL TIME TAKING CARE OF THIS PATIENT: 38 minutes.  More than 50% time spent in counseling, coordination of care POSSIBLE D/C IN 2 to 3 DAYS, DEPENDING ON CLINICAL CONDITION.  Gabriel Delacruz M.D on 04/18/2019 at 1:51 PM  Between 7am to 6pm - Pager - [phone]  After 6pm go to [email or web address] - password [email or web address] Gabriel Delacruz  Office  [phone]  CC: Primary care physician; Gabriel Elk, MD  Note: This dictation was prepared with Dragon dictation along with smaller phrase technology. Any transcriptional errors that result from this process are unintentional.

## 2019-04-18 NOTE — Progress Notes (Signed)
PT Cancellation Note  Patient Details Name: Tilton Cata MRN: [id number] DOB: 1928-06-29   Cancelled Treatment:    Reason Eval/Treat Not Completed: Patient at procedure or test/unavailable.  Pt currently receiving bed bath with nursing.  Checked with RN and pt is cleared to be seen for bed there ex only.  Roxanne Gates, PT, DPT  Roxanne Gates 04/18/2019, 10:20 AM

## 2019-04-18 NOTE — Plan of Care (Signed)
No complaints of pain this shift  Problem: Clinical Measurements: Goal: Respiratory complications will improve Outcome: Progressing Note: On room air   Problem: Nutrition: Goal: Adequate nutrition will be maintained Outcome: Progressing

## 2019-04-18 NOTE — Progress Notes (Addendum)
[facility] for warfarin dosing/monitoring Indication: atrial fibrillation  No Known Allergies  Patient Measurements: Height: 5\' 6"  (167.6 cm) Weight: 171 lb 4.8 oz (77.7 kg) IBW/kg (Calculated) : 63.8  Vital Signs: Temp: 98.1 F (36.7 C) (09/02 0346) Temp Source: Oral (09/02 0346) BP: 102/61 (09/02 0346) Pulse Rate: 73 (09/02 0346)  Labs: Recent Labs    04/16/19 0723 04/17/19 0504 04/18/19 0446  HGB  --  11.9*  --   HCT  --  36.4*  --   PLT  --  114*  --   LABPROT 20.5* 18.8* 17.9*  INR 1.8* 1.6* 1.5*  CREATININE 0.95  --   --     Estimated Creatinine Clearance: 51.7 mL/min (by C-G formula based on SCr of 0.95 mg/dL).   Medical History: Past Medical History:  Diagnosis Date  . Anemia   . Atrial fibrillation ([place])   . COPD (chronic obstructive pulmonary disease) ([place])   . GERD (gastroesophageal reflux disease)   . Glaucoma   . Hyperlipemia   . Hypertension   . Hypothyroidism   . Irregular heart rhythm   . Left anterior fascicular block   . MGUS (monoclonal gammopathy of unknown significance)   . RBBB     Medications:  Scheduled:  . docusate sodium  100 mg Oral BID  . feeding supplement (ENSURE ENLIVE)  237 mL Oral BID BM  . furosemide  20 mg Oral Daily  . ipratropium-albuterol  3 mL Nebulization BID  . latanoprost  1 drop Both Eyes QHS  . levothyroxine  88 mcg Oral Q0600  . multivitamin with minerals  1 tablet Oral Daily  . polyethylene glycol  17 g Oral Daily  . potassium chloride SA  10 mEq Oral Daily  . sodium chloride flush  3 mL Intravenous Q12H  . Warfarin - Pharmacist Dosing Inpatient   Does not apply q1800    Assessment: Patient admitted x hypotension and leg swelling w/ h/o afib s/p PM w/ RBBB and EKG showing accelerated rhythm w/ PVCs, DM, hypothyroidism w/ TSH of 16 08/26, w/ worsening bilateral LE edema w/ hypotension leading to his losartan and hctz being discontinued w/ worsening leg swelling.  Patient is anticoagulated on warfarin 6.5 mg daily PTA for afib. No major DDI. Pt has HFrEF which can increase INR.   Today's INR is subtherapeutic. Likely as a result of being on a lower dose, than at home, during this admission. Additionally, patient's thyroid state may require more warfarin. On admission, patient was supratherapeutic on warfarin 6.5 mg.  Date INR Warfarin Dose  8/26 3.3 None  8/27 2.9 2 mg  8/28 3.2 Hold  8/29 2.3 2 mg  8/30 2.2 2 mg  8/31 1.8 2.5 mg   9/01 1.6 3 mg  9/02 1.5                 Goal of Therapy:  INR 2-3 Monitor platelets by anticoagulation protocol: Yes   Plan:  Will order warfarin 5 mg x 1 tonight (PTA dose is 6.5 mg). Daily INR ordered and CBC every three days.  Kristeen Miss, PharmD Clinical Pharmacist 04/18/2019

## 2019-04-18 NOTE — Progress Notes (Addendum)
PT Cancellation Note  Patient Details Name: Gabriel Delacruz MRN: [id number] DOB: 05/10/1928   Cancelled Treatment:    Reason Eval/Treat Not Completed: Patient declined, no reason specified;Medical issues which prohibited therapy.  Pt currently eating breakfast when PT checked in.  Last 2-3 HR's recorded, pt was bradycardic and INR below normal range (addended 1021, 04/18/2019).  Will clear with RN before re-attempting to see pt.  Roxanne Gates, PT, DPT  Roxanne Gates 04/18/2019, 8:45 AM, Addendum 1022, 04/18/2019

## 2019-04-18 NOTE — Progress Notes (Signed)
Nutrition Follow Up Note   DOCUMENTATION CODES:   Not applicable  INTERVENTION:   Provide Ensure Enlive po BID, each supplement provides 350 kcal and 20 grams of protein.  MVI daily   NUTRITION DIAGNOSIS:   Inadequate oral intake related to decreased appetite as evidenced by per patient/family report.  GOAL:   Patient will meet greater than or equal to 90% of their needs  -progressing   MONITOR:   PO intake, Supplement acceptance, Labs, Weight trends, I & O's  ASSESSMENT:   85 year old male with PMHx of glaucoma, HTN, A-fib, GERD, hypothyroidism, COPD admitted with new onset CHF.   Pt with improved appetite and oral intake; pt eating 100% of meals and drinking Ensure. Per chart, pt has remained weight stable since admit. Pt with continued intermittent confusion; will hold off on low sodium diet education at this time.   Medications reviewed and include: colace, lasix, synthroid, MVI, miralax, KCl, warfarin  Labs reviewed: Na 133(L) Hgb 11.9(L), Hct 36.4(L), MCV 107.4(L), MCH 35.1(H)  Diet Order:   Diet Order            Diet 2 gram sodium Room service appropriate? Yes; Fluid consistency: Thin  Diet effective now             EDUCATION NEEDS:   Not appropriate for education at this time  Skin:  Skin Assessment: Reviewed RN Assessment  Last BM:  9/2- type 6  Height:   Ht Readings from Last 1 Encounters:  04/11/19 5\' 6"  (1.676 m)   Weight:   Wt Readings from Last 1 Encounters:  04/18/19 77.7 kg   Ideal Body Weight:  64.5 kg  BMI:  Body mass index is 27.65 kg/m.  Estimated Nutritional Needs:   Kcal:  1700-1900  Protein:  85-95 grams  Fluid:  1.8 L/day  Koleen Distance MS, RD, LDN Pager #- [phone] Office#- [phone] After Hours Pager: [phone]

## 2019-04-18 NOTE — Progress Notes (Signed)
Physical Therapy Treatment Patient Details Name: Gabriel Delacruz MRN: [id number] DOB: 1928/04/15 Today's Date: 04/18/2019    History of Present Illness 85 year old male patient with history of chronic atrial fibrillation, COPD, GERD, hyperlipidemia, hypertension, monoclonal gammopathy, hypothyroidism admitted for hypotension, leg swelling, work up showed new onset congestive heart failure    PT Comments    Pt cleared to perform bed exercises by RN prior to session.  He was alert and amenable to therapy.  Pt able to perform most there ex with VC's and min manual assistance to initiate.  He reported no knee pain today and stated that he feels his R foot/ankle swelling has decreased.  PT noted this decrease and advised pt to continue to elevate his LE and perform ankle pumps frequently.  Pt agreeable.  PT provided education concerning management of HEP and frequency of performance.  Pt will continue to benefit from skilled PT with focus on strength, management of knee pain and safe functional mobility.   Follow Up Recommendations  SNF     Equipment Recommendations  None recommended by PT;Other (comment)(Pt has RW, standard walker, cane at home)    Recommendations for Other Services       Precautions / Restrictions Precautions Precautions: Fall Restrictions Weight Bearing Restrictions: No    Mobility  Bed Mobility Overal bed mobility: (No bed mobility or transfers performed today per nursing request.)                Transfers                    Ambulation/Gait                 Stairs             Wheelchair Mobility    Modified Rankin (Stroke Patients Only)       Balance                                            Cognition Arousal/Alertness: Awake/alert Behavior During Therapy: WFL for tasks assessed/performed Overall Cognitive Status: No family/caregiver present to determine baseline cognitive functioning                                  General Comments: Pleasant and very compliant.  Does not have much to say throughout the treatment.      Exercises General Exercises - Lower Extremity Ankle Circles/Pumps: Strengthening;AROM;20 reps;Supine Quad Sets: Left;Supine;15 reps Gluteal Sets: Both;10 reps;Supine(with partial bridge) Short Arc Quad: 15 reps;Strengthening;Supine Heel Slides: Left;Supine;Strengthening;15 reps Hip ABduction/ADduction: Strengthening;Left;10 reps;Supine(seated pillow squeeze x10 with VC's to sit upright without use of UE's.) Straight Leg Raises: AAROM;10 reps;Both;Supine Hip Flexion/Marching: Strengthening;20 reps;Seated;Both Other Exercises Other Exercises: Education concerning management of HEP and knee pain with ther ex.  x5 min Other Exercises: Assistance with scooting up in bed and positioning for comfort.  Pt able to scoot up in bed with bridge exercise when given VC's. x3 min    General Comments        Pertinent Vitals/Pain Pain Assessment: No/denies pain Pain Location: L knee is "feeling better" today. Pain Intervention(s): Monitored during session    Home Living                      Prior Function  PT Goals (current goals can now be found in the care plan section) Acute Rehab PT Goals Patient Stated Goal: to go home PT Goal Formulation: With patient Time For Goal Achievement: 04/27/19 Potential to Achieve Goals: Good Progress towards PT goals: Progressing toward goals    Frequency    Min 2X/week      PT Plan Current plan remains appropriate    Co-evaluation              AM-PAC PT "6 Clicks" Mobility   Outcome Measure  Help needed turning from your back to your side while in a flat bed without using bedrails?: A Little Help needed moving from lying on your back to sitting on the side of a flat bed without using bedrails?: A Little Help needed moving to and from a bed to a chair (including a wheelchair)?: A  Lot Help needed standing up from a chair using your arms (e.g., wheelchair or bedside chair)?: A Lot Help needed to walk in hospital room?: A Lot Help needed climbing 3-5 steps with a railing? : A Lot 6 Click Score: 14    End of Session Equipment Utilized During Treatment: Gait belt Activity Tolerance: Patient limited by fatigue;Patient limited by pain(L knee pain.) Patient left: in bed;with bed alarm set;with call bell/phone within reach Nurse Communication: (IV in R hand appeared to have dried blood and also blood on pt's bedsheets in area where R UE rests.) PT Visit Diagnosis: Other abnormalities of gait and mobility (R26.89);Muscle weakness (generalized) (M62.81);Difficulty in walking, not elsewhere classified (R26.2)     Time: [id number] PT Time Calculation (min) (ACUTE ONLY): 23 min  Charges:  $Therapeutic Exercise: 23-37 mins                     Roxanne Gates, PT, DPT    Roxanne Gates 04/18/2019, 12:49 PM

## 2019-04-19 LAB — CREATININE, SERUM
Creatinine, Ser: 0.89 mg/dL (ref 0.61–1.24)
GFR calc Af Amer: >60 mL/min (ref >60)
GFR calc non Af Amer: >60 mL/min (ref >60)

## 2019-04-19 LAB — PROTIME-INR
INR: 1.3 — ABNORMAL HIGH (ref 0.8–1.2)
Prothrombin Time: 16.4 seconds — ABNORMAL HIGH (ref 11.4–15.2)

## 2019-04-19 LAB — POTASSIUM: Potassium: 4.7 mmol/L (ref 3.5–5.1)

## 2019-04-19 MED ORDER — WARFARIN SODIUM 4 MG PO TABS
8.0000 mg | ORAL_TABLET | Freq: Once | ORAL | Status: DC
  Filled 2019-04-19: qty 2

## 2019-04-19 MED ORDER — WARFARIN SODIUM 10 MG PO TABS
10.0000 mg | ORAL_TABLET | Freq: Once | ORAL | Status: AC
  Administered 2019-04-19: 10 mg via ORAL
  Filled 2019-04-19: qty 1

## 2019-04-19 NOTE — Progress Notes (Signed)
Physical Therapy Treatment Patient Details Name: Gabriel Delacruz MRN: [id number] DOB: [birth year] Today's Date: 04/19/2019    History of Present Illness 85 year old male patient with history of chronic atrial fibrillation, COPD, GERD, hyperlipidemia, hypertension, monoclonal gammopathy, hypothyroidism admitted for hypotension, leg swelling, work up showed new onset congestive heart failure    PT Comments    Pt able to progress to STS and ambulation to chair with Max +2 assistance to manage RW and balance today.  He was alert and ready to work with therapy when PT entered, though pt did demonstrate some signs of disorientation to location.  Pt able to perform bed mobility with mod A +1 and sit at EOB with close supervision.  Pt reported no knee pain today limiting mobility but did maintain a wt shift to R side during all WB activity.  HE also expressed concern about ankle swelling and PT provided education concerning management of there ex HEP and positioning for management of these things.  Pt able to complete all seated there ex with VC's and min manual cues for positioning and form.  Pt will continue to benefit from skilled PT with focus on strength, tolerance to activity and safe functional mobility.  He experienced decline in function due to knee pain and mobility contraindications related to low INR, but has been able to demonstrate progress in the days following.  Due to pt mobility deficits compared to his baseline level of function, pt will benefit from SNF placement following discharge.   Follow Up Recommendations  SNF     Equipment Recommendations  None recommended by PT(Pt has RW, standard walker, cane at home)    Recommendations for Other Services       Precautions / Restrictions Precautions Precautions: Fall Restrictions Weight Bearing Restrictions: No    Mobility  Bed Mobility Overal bed mobility: Needs Assistance Bed Mobility: Supine to Sit     Supine to sit: Mod  assist     General bed mobility comments: Hand held assist to get to bedside and scoot forward.  VC's for sequencing and body mechanics needed throughout.  Transfers Overall transfer level: Needs assistance Equipment used: Standard walker Transfers: Sit to/from Stand Sit to Stand: Max assist;+2 physical assistance         General transfer comment: PT and PT tech assisted pt in standing from bedside.  He was slow to rise and wt shifted to R side, reporting no pain in L LE.  Pt able to achieve upright posture with heavy reliance on RW.  Ambulation/Gait Ambulation/Gait assistance: Min assist Gait Distance (Feet): 8 Feet Assistive device: Rolling walker (2 wheeled) Gait Pattern/deviations: Step-to pattern;Decreased stance time - left   Gait velocity interpretation: <1.31 ft/sec, indicative of household ambulator General Gait Details: Very slow gait with assistance to manage RW.  Pt maintained a wt shift to R side throughout but did not report pain.  Pt able to sidestep and retro walk to chair with VC's to wait to sit until pt could feel chair.   Stairs             Wheelchair Mobility    Modified Rankin (Stroke Patients Only)       Balance Overall balance assessment: Needs assistance Sitting-balance support: Feet supported Sitting balance-Leahy Scale: Fair       Standing balance-Leahy Scale: Poor Standing balance comment: Heavily reliant on RW with R lateral lean; able to wt shift minimally to L side with VC's.  Cognition Arousal/Alertness: Awake/alert Behavior During Therapy: WFL for tasks assessed/performed Overall Cognitive Status: No family/caregiver present to determine baseline cognitive functioning                                 General Comments: Pt asked why "they keep moving" him to another room.  PT explained that pt has been in this room since he started PT.  Pt able to follow directions  consistently.      Exercises General Exercises - Lower Extremity Ankle Circles/Pumps: 20 reps;Seated;Both;AROM Quad Sets: Both;10 reps;Seated;Strengthening Long Arc Quad: Strengthening;10 reps;Both;Seated Hip ABduction/ADduction: Both;10 reps;Seated;Strengthening Hip Flexion/Marching: 20 reps;Seated;Both(VC's to sit upright and not use UE's for support.) Other Exercises Other Exercises: Education concerning benefit of ther ex for managment of ankle swelling as well as elevation of LE's. x3 min    General Comments        Pertinent Vitals/Pain Pain Assessment: No/denies pain Pain Location: L knee is "not hurting anymore".  Pt is concerned about ankle edema.  When asked how pt feels today, he replied "rough" but was unable to specify the reason. Pain Intervention(s): Monitored during session    Home Living                      Prior Function            PT Goals (current goals can now be found in the care plan section) Acute Rehab PT Goals Patient Stated Goal: to go home PT Goal Formulation: With patient Time For Goal Achievement: 04/27/19 Potential to Achieve Goals: Good Progress towards PT goals: Progressing toward goals    Frequency    Min 2X/week      PT Plan Current plan remains appropriate    Co-evaluation              AM-PAC PT "6 Clicks" Mobility   Outcome Measure  Help needed turning from your back to your side while in a flat bed without using bedrails?: A Little Help needed moving from lying on your back to sitting on the side of a flat bed without using bedrails?: A Little Help needed moving to and from a bed to a chair (including a wheelchair)?: A Lot Help needed standing up from a chair using your arms (e.g., wheelchair or bedside chair)?: A Lot Help needed to walk in hospital room?: A Lot Help needed climbing 3-5 steps with a railing? : A Lot 6 Click Score: 14    End of Session Equipment Utilized During Treatment: Gait  belt Activity Tolerance: Patient limited by fatigue;Patient limited by pain(L knee pain.) Patient left: in chair;with chair alarm set;with call bell/phone within reach Nurse Communication: Mobility status(IV in R hand appeared to have dried blood and also blood on pt's bedsheets in area where R UE rests.) PT Visit Diagnosis: Other abnormalities of gait and mobility (R26.89);Muscle weakness (generalized) (M62.81);Difficulty in walking, not elsewhere classified (R26.2)     Time: [id number] PT Time Calculation (min) (ACUTE ONLY): 24 min  Charges:  $Therapeutic Exercise: 8-22 mins $Therapeutic Activity: 8-22 mins                     Roxanne Gates, PT, DPT    Roxanne Gates 04/19/2019, 10:14 AM

## 2019-04-19 NOTE — Progress Notes (Signed)
[name] [facility] [place] NAME: Gabriel Delacruz    MR#:  [id number]  DATE OF BIRTH:  [birth year]  SUBJECTIVE: No shortness of breath.  Worked with physical therapy today.  Call [facility] for peer to peer, but they want the social worker to talk to them first  CHIEF COMPLAINT:   Chief Complaint  Patient presents with  . Hypotension  . Leg Swelling   REVIEW OF SYSTEMS:    Review of Systems  Constitutional: Negative for chills and fever.  HENT: Negative for hearing loss.   Eyes: Negative for blurred vision, double vision and photophobia.  Respiratory: Negative for cough, hemoptysis and shortness of breath.   Cardiovascular: Negative for palpitations, orthopnea and leg swelling.  Gastrointestinal: Negative for abdominal pain, diarrhea and vomiting.  Genitourinary: Negative for dysuria and urgency.  Musculoskeletal: Positive for joint pain. Negative for myalgias and neck pain.  Skin: Negative for rash.  Neurological: Negative for dizziness, focal weakness, seizures, weakness and headaches.  Psychiatric/Behavioral: Negative for memory loss. The patient does not have insomnia.       DRUG ALLERGIES:  No Known Allergies  VITALS:  Blood pressure 114/61, pulse 69, temperature 98.4 F (36.9 C), temperature source Oral, resp. rate 18, height 5\' 6"  (1.676 m), weight 78 kg, SpO2 100 %.  PHYSICAL EXAMINATION:   Physical Exam  GENERAL:  85 y.o.-year-old patient lying in the bed with no acute distress.  EYES: Pupils equal, round, reactive to light and accommodation. No scleral icterus. Extraocular muscles intact.  HEENT: Head atraumatic, normocephalic. Oropharynx and nasopharynx clear.  NECK:  Supple, no jugular venous distention. No thyroid enlargement, no tenderness.  LUNGS: Improved breath sounds bilaterally, no crepitations today. CARDIOVASCULAR: S1, S2 iregular.. No murmurs, rubs, or gallops.  ABDOMEN: Soft, nontender, nondistended. Bowel sounds  present. No organomegaly or mass.  EXTREMITIES: No cyanosis, clubbing  has edema b/l.    NEUROLOGIC: Cranial nerves II through XII are intact. No focal Motor or sensory deficits b/l.   PSYCHIATRIC: The patient is alert and oriented x 3.  SKIN: No obvious rash, lesion, or ulcer.   LABORATORY PANEL:   CBC Recent Labs  Lab 04/17/19 0504  WBC 4.4  HGB 11.9*  HCT 36.4*  PLT 114*   ------------------------------------------------------------------------------------------------------------------ Chemistries  Recent Labs  Lab 04/16/19 0723 04/19/19 0456  NA 133*  --   K 4.3 4.7  CL 96*  --   CO2 30  --   GLUCOSE 97  --   BUN 26*  --   CREATININE 0.95 0.89  CALCIUM 8.9  --    ------------------------------------------------------------------------------------------------------------------  Cardiac Enzymes No results for input(s): TROPONINI in the last 168 hours. ------------------------------------------------------------------------------------------------------------------  RADIOLOGY:  No results found.   ASSESSMENT AND PLAN:  85 year old male patient with history of chronic atrial fibrillation, COPD, GERD, hyperlipidemia, hypertension, monoclonal gammopathy, hypothyroidism currently under hospitalist service  -New onset congestive heart failure On low-dose diuretics.   Echocardiogram shows reduced systolic function with EF of 25 to 30% Monitor electrolytes Slightly elevated troponin secondary to demand ischemia. Appreciate cardiology following..  Daily body weights and input / output chart  -Chronic atrial fibrillation Continue anticoagulation with Coumadin Rate well controlled  pharmacy is managing anticoagulation.  Patient has has pacemaker.  -COPD appears stable Home dose inhalers  -DVT prophylaxis On Coumadin  -Ambulatory dysfunction Physical therapy saw the patient, was living alone before he came, patient will continue to benefit from skilled  physical therapy with focus on strength, tolerance to  activity, safe functional mobility.  Patient has been able to demonstrate progress in the days following, we strongly believe patient will benefit from physical therapy at skilled nursing.    COVID-19 test negative from September 1 again    All the records are reviewed and case discussed with Care Management/Social Worker. Management plans discussed with the patient, family and they are in agreement.  CODE STATUS:Full code  DVT Prophylaxis: SCDs  TOTAL TIME TAKING CARE OF THIS PATIENT: 38 minutes.  More than 50% time spent in counseling, coordination of care POSSIBLE D/C IN 2 to 3 DAYS, DEPENDING ON CLINICAL CONDITION.  Epifanio Lesches M.D on 04/19/2019 at 11:57 AM  Between 7am to 6pm - Pager - [phone]  After 6pm go to [email or web address] - password [email or web address] [place] [facility]  Office  [phone]  CC: Primary care physician; Marinda Elk, MD  Note: This dictation was prepared with Dragon dictation along with smaller phrase technology. Any transcriptional errors that result from this process are unintentional.

## 2019-04-19 NOTE — Progress Notes (Signed)
[place] for warfarin dosing/monitoring Indication: atrial fibrillation  No Known Allergies  Patient Measurements: Height: 5\' 6"  (167.6 cm) Weight: 171 lb 14.4 oz (78 kg) IBW/kg (Calculated) : 63.8  Vital Signs: Temp: 98.4 F (36.9 C) (09/03 0726) Temp Source: Oral (09/03 0726) BP: 114/61 (09/03 0726) Pulse Rate: 69 (09/03 0726)  Labs: Recent Labs    04/17/19 0504 04/18/19 0446 04/19/19 0456  HGB 11.9*  --   --   HCT 36.4*  --   --   PLT 114*  --   --   LABPROT 18.8* 17.9* 16.4*  INR 1.6* 1.5* 1.3*  CREATININE  --   --  0.89    Estimated Creatinine Clearance: 55.3 mL/min (by C-G formula based on SCr of 0.89 mg/dL).   Medical History: Past Medical History:  Diagnosis Date  . Anemia   . Atrial fibrillation ([place])   . COPD (chronic obstructive pulmonary disease) ([place])   . GERD (gastroesophageal reflux disease)   . Glaucoma   . Hyperlipemia   . Hypertension   . Hypothyroidism   . Irregular heart rhythm   . Left anterior fascicular block   . MGUS (monoclonal gammopathy of unknown significance)   . RBBB     Medications:  Scheduled:  . docusate sodium  100 mg Oral BID  . feeding supplement (ENSURE ENLIVE)  237 mL Oral BID BM  . furosemide  20 mg Oral Daily  . latanoprost  1 drop Both Eyes QHS  . levothyroxine  88 mcg Oral Q0600  . multivitamin with minerals  1 tablet Oral Daily  . polyethylene glycol  17 g Oral Daily  . potassium chloride SA  10 mEq Oral Daily  . sodium chloride flush  3 mL Intravenous Q12H  . warfarin  8 mg Oral ONCE-1800  . Warfarin - Pharmacist Dosing Inpatient   Does not apply q1800    Assessment: Patient admitted x hypotension and leg swelling w/ h/o afib s/p PM w/ RBBB and EKG showing accelerated rhythm w/ PVCs, DM, hypothyroidism w/ TSH of 16 08/26, w/ worsening bilateral LE edema w/ hypotension leading to his losartan and hctz being discontinued w/ worsening leg swelling. Patient is  anticoagulated on warfarin 6.5 mg daily PTA for afib. On admission, patient was supratherapeutic on warfarin 6.5 mg.  Today's INR is subtherapeutic. Likely as a result of being on a lower dose, than at home, during this admission. Additionally, patient's thyroid state may require more warfarin. Pt has HFrEF which can increase INR, though patient has been euvolemic for the past 3 days.  No major DDI.   Date INR Warfarin Dose  8/26 3.3 None  8/27 2.9 2 mg  8/28 3.2 Hold  8/29 2.3 2 mg  8/30 2.2 2 mg  8/31 1.8 2.5 mg   9/01 1.6 3 mg  9/02 1.5 5 mg   9/03 1.3 -----            Goal of Therapy:  INR 2-3 Monitor platelets by anticoagulation protocol: Yes   Plan:  Will order warfarin 8 mg x 1 tonight (home dose is 6.5 mg). Daily INR ordered and CBC every three days.  Update: Dr. Vianne Bulls requested Warfarin 10 mg x1 dose for this evening. Will adjust dose accordingly.   Kristeen Miss, PharmD Clinical Pharmacist 04/19/2019

## 2019-04-19 NOTE — Progress Notes (Signed)
Spoke with patient's daughter Shirlean Mylar.  Waiting for insurance approval.  INR is 1.3, pharmacist to adjust tCoumadin.

## 2019-04-19 NOTE — Plan of Care (Signed)
  Problem: Education: Goal: Knowledge of General Education information will improve Description: Including pain rating scale, medication(s)/side effects and non-pharmacologic comfort measures Outcome: Progressing   Problem: Clinical Measurements: Goal: Respiratory complications will improve Outcome: Progressing   Problem: Nutrition: Goal: Adequate nutrition will be maintained Outcome: Progressing   

## 2019-04-19 NOTE — Care Management Important Message (Signed)
Important Message  Patient Details  Name: Gabriel Delacruz MRN: [id number] Date of Birth: 01/25/1928   Medicare Important Message Given:  Yes     [name] 04/19/2019, 1:18 PM

## 2019-04-19 NOTE — TOC Progression Note (Signed)
Transition of Care [facility]) - Progression Note    Patient Details  Name: Perla Letner MRN: [id number] Date of Birth: Sep 28, 1927  [facility]) CM/SW Contact  Ross Ludwig, [place] Phone Number: 04/19/2019, 6:41 PM  Clinical Narrative:     CSW received phone call from insurance company that patient has been approved for SNF placement at [facility] once he is medically ready for discharge and orders have been received.  Authorization number is [id number], CSW to continue to follow patient's progress throughout discharge planning.   Expected Discharge Plan: [place] Barriers to Discharge: Continued Medical Work up  Expected Discharge Plan and Services Expected Discharge Plan: [place] In-house Referral: Clinical Social Work     Living arrangements for the past 2 months: Single Family Home                              Social Determinants of Health (SDOH) Interventions    Readmission Risk Interventions Readmission Risk Prevention Plan 04/12/2019  Medication Screening Complete  Transportation Screening Complete  Some recent data might be hidden

## 2019-04-19 NOTE — Progress Notes (Addendum)
Gabriel Delacruz for warfarin dosing/monitoring Indication: atrial fibrillation  No Known Allergies  Patient Measurements: Height: 5\' 6"  (167.6 cm) Weight: 171 lb 14.4 oz (78 kg) IBW/kg (Calculated) : 63.8  Vital Signs: Temp: 98.4 F (36.9 C) (09/03 0726) Temp Source: Oral (09/03 0726) BP: 114/61 (09/03 0726) Pulse Rate: 69 (09/03 0726)  Labs: Recent Labs    04/17/19 0504 04/18/19 0446 04/19/19 0456  HGB 11.9*  --   --   HCT 36.4*  --   --   PLT 114*  --   --   LABPROT 18.8* 17.9* 16.4*  INR 1.6* 1.5* 1.3*  CREATININE  --   --  0.89    Estimated Creatinine Clearance: 55.3 mL/min (by C-G formula based on SCr of 0.89 mg/dL).   Medical History: Past Medical History:  Diagnosis Date  . Anemia   . Atrial fibrillation ([place])   . COPD (chronic obstructive pulmonary disease) ([place])   . GERD (gastroesophageal reflux disease)   . Glaucoma   . Hyperlipemia   . Hypertension   . Hypothyroidism   . Irregular heart rhythm   . Left anterior fascicular block   . MGUS (monoclonal gammopathy of unknown significance)   . RBBB     Medications:  Scheduled:  . docusate sodium  100 mg Oral BID  . feeding supplement (ENSURE ENLIVE)  237 mL Oral BID BM  . furosemide  20 mg Oral Daily  . ipratropium-albuterol  3 mL Nebulization BID  . latanoprost  1 drop Both Eyes QHS  . levothyroxine  88 mcg Oral Q0600  . multivitamin with minerals  1 tablet Oral Daily  . polyethylene glycol  17 g Oral Daily  . potassium chloride SA  10 mEq Oral Daily  . sodium chloride flush  3 mL Intravenous Q12H  . Warfarin - Pharmacist Dosing Inpatient   Does not apply q1800    Assessment: Patient admitted x hypotension and leg swelling w/ h/o afib s/p PM w/ RBBB and EKG showing accelerated rhythm w/ PVCs, DM, hypothyroidism w/ TSH of 16 08/26, w/ worsening bilateral LE edema w/ hypotension leading to his losartan and hctz being discontinued w/ worsening leg swelling.  Patient is anticoagulated on warfarin 6.5 mg daily PTA for afib. On admission, patient was supratherapeutic on warfarin 6.5 mg.  Today's INR is subtherapeutic. Likely as a result of being on a lower dose, than at home, during this admission. Additionally, patient's thyroid state may require more warfarin. Pt has HFrEF which can increase INR, though patient has been euvolemic for the past 3 days.  No major DDI.   Date INR Warfarin Dose  8/26 3.3 None  8/27 2.9 2 mg  8/28 3.2 Hold  8/29 2.3 2 mg  8/30 2.2 2 mg  8/31 1.8 2.5 mg   9/01 1.6 3 mg  9/02 1.5 5 mg   9/03 1.3 -----            Goal of Therapy:  INR 2-3 Monitor platelets by anticoagulation protocol: Yes   Plan:  Will order warfarin 8 mg x 1 tonight (home dose is 6.5 mg). Daily INR ordered and CBC every three days.  Kristeen Miss, PharmD Clinical Pharmacist 04/19/2019

## 2019-04-20 ENCOUNTER — Inpatient Hospital Stay: Payer: Medicare HMO

## 2019-04-20 ENCOUNTER — Ambulatory Visit: Payer: Medicare HMO | Admitting: Family

## 2019-04-20 LAB — PROTIME-INR
INR: 1.4 — ABNORMAL HIGH (ref 0.8–1.2)
INR: 1.4 — ABNORMAL HIGH (ref 0.8–1.2)
Prothrombin Time: 16.8 seconds — ABNORMAL HIGH (ref 11.4–15.2)
Prothrombin Time: 16.8 seconds — ABNORMAL HIGH (ref 11.4–15.2)

## 2019-04-20 LAB — CBC
HCT: 37.7 % — ABNORMAL LOW (ref 39.0–52.0)
Hemoglobin: 12.6 g/dL — ABNORMAL LOW (ref 13.0–17.0)
MCH: 35.3 pg — ABNORMAL HIGH (ref 26.0–34.0)
MCHC: 33.4 g/dL (ref 30.0–36.0)
MCV: 105.6 fL — ABNORMAL HIGH (ref 80.0–100.0)
Platelets: 148 10*3/uL — ABNORMAL LOW (ref 150–400)
RBC: 3.57 MIL/uL — ABNORMAL LOW (ref 4.22–5.81)
RDW: 13 % (ref 11.5–15.5)
WBC: 4.6 10*3/uL (ref 4.0–10.5)
nRBC: 0 % (ref 0.0–0.2)

## 2019-04-20 MED ORDER — APIXABAN 5 MG PO TABS
5.0000 mg | ORAL_TABLET | Freq: Two times a day (BID) | ORAL | Status: DC
  Administered 2019-04-21 – 2019-04-26 (×11): 5 mg via ORAL
  Filled 2019-04-20 (×11): qty 1

## 2019-04-20 MED ORDER — WARFARIN SODIUM 10 MG PO TABS
10.0000 mg | ORAL_TABLET | Freq: Once | ORAL | Status: DC
  Filled 2019-04-20: qty 1

## 2019-04-20 NOTE — Progress Notes (Signed)
[place] for transition from warfarin to apixaban  Indication: atrial fibrillation  No Known Allergies  Patient Measurements: Height: 5\' 6"  (167.6 cm) Weight: 172 lb (78 kg) IBW/kg (Calculated) : 63.8  Vital Signs: Temp: 98.1 F (36.7 C) (09/04 1616) Temp Source: Oral (09/04 1616) BP: 116/57 (09/04 1616) Pulse Rate: 75 (09/04 1616)  Labs: Recent Labs    04/19/19 0456 04/20/19 0834 04/20/19 1252  HGB  --  12.6*  --   HCT  --  37.7*  --   PLT  --  148*  --   LABPROT 16.4* 16.8* 16.8*  INR 1.3* 1.4* 1.4*  CREATININE 0.89  --   --     Estimated Creatinine Clearance: 55.3 mL/min (by C-G formula based on SCr of 0.89 mg/dL).   Medical History: Past Medical History:  Diagnosis Date  . Anemia   . Atrial fibrillation ([place])   . COPD (chronic obstructive pulmonary disease) ([place])   . GERD (gastroesophageal reflux disease)   . Glaucoma   . Hyperlipemia   . Hypertension   . Hypothyroidism   . Irregular heart rhythm   . Left anterior fascicular block   . MGUS (monoclonal gammopathy of unknown significance)   . RBBB     Medications:  Scheduled:  . docusate sodium  100 mg Oral BID  . feeding supplement (ENSURE ENLIVE)  237 mL Oral BID BM  . furosemide  20 mg Oral Daily  . latanoprost  1 drop Both Eyes QHS  . levothyroxine  88 mcg Oral Q0600  . multivitamin with minerals  1 tablet Oral Daily  . polyethylene glycol  17 g Oral Daily  . potassium chloride SA  10 mEq Oral Daily  . sodium chloride flush  3 mL Intravenous Q12H    Assessment: Patient admitted x hypotension and leg swelling w/ h/o afib s/p PM w/ RBBB and EKG showing accelerated rhythm w/ PVCs, DM, hypothyroidism w/ TSH of 16 08/26, w/ worsening bilateral LE edema w/ hypotension leading to his losartan and hctz being discontinued w/ worsening leg swelling. Patient was on warfarin 6.5 mg daily PTA for afib. On admission, patient was supratherapeutic on warfarin 6.5  mg.  Today's INR is subtherapeutic. Likely as a result of being on a lower dose, than at home, during this admission. Additionally, patient's thyroid state may require more warfarin. Pt has HFrEF which can increase INR.  No major DDI.   Date INR Warfarin Dose  8/26 3.3 None  8/27 2.9 2 mg  8/28 3.2 Hold  8/29 2.3 2 mg  8/30 2.2 2 mg  8/31 1.8 2.5 mg   9/01 1.6 3 mg  9/02 1.5 5 mg   9/03 1.3 10 mg   9/04 1.4 ----        Goal of Therapy:  INR 2-3 Monitor platelets by anticoagulation protocol: Yes   Plan:  9/4 Pharmacy consulted from apixaban dosing. Patient now transitioning from warfarin to apixaban.   When transitioning from warfarin to apixaban, apixaban can be started as soon as INR <2.  Last INR 9/1 @ 1252 is 1.4. Patient did receive warfarin 10mg  yesterday evening.   Will plan to start apixaban 5mg  BID 9/5 AM after INR draw to confirm INR <2.   Pernell Dupre, PharmD, BCPS Clinical Pharmacist 04/20/2019 6:25 PM

## 2019-04-20 NOTE — TOC Progression Note (Signed)
Transition of Care [facility]) - Progression Note    Patient Details  Name: Gabriel Delacruz MRN: [id number] Date of Birth: 1928/01/24  Transition of Care [facility]) CM/SW Contact  Ross Ludwig, [place] Phone Number: 04/20/2019, 5:49 PM  Clinical Narrative:     CSW was informed that patient's INR is not therapeutic yet, and he is not medically ready for discharge yet.  CSW updated [facility] and patient's daughter who was at bedside.  CSW was informed by [facility] that [employer] authorization is good through Tuesday.  If patient discharges on Wednesday, SNF will have to get new insurance authorization.  CSW continuing to follow patient's progress throughout discharge planning.   Expected Discharge Plan: [place] Barriers to Discharge: Continued Medical Work up  Expected Discharge Plan and Services Expected Discharge Plan: [place] In-house Referral: Clinical Social Work     Living arrangements for the past 2 months: Single Family Home                                       Social Determinants of Health (SDOH) Interventions    Readmission Risk Interventions Readmission Risk Prevention Plan 04/12/2019  Medication Screening Complete  Transportation Screening Complete  Some recent data might be hidden

## 2019-04-20 NOTE — Progress Notes (Signed)
[facility] [place] NAME: Gabriel Delacruz    MR#:  [id number]  DATE OF BIRTH:  Jun 16, 1928  SUBJECTIVE: patient is approved to go to [facility] but INR is still 1.4.    CHIEF COMPLAINT:   Chief Complaint  Patient presents with  . Hypotension  . Leg Swelling   REVIEW OF SYSTEMS:    Review of Systems  Constitutional: Negative for chills and fever.  HENT: Negative for hearing loss.   Eyes: Negative for blurred vision, double vision and photophobia.  Respiratory: Negative for cough, hemoptysis and shortness of breath.   Cardiovascular: Negative for palpitations, orthopnea and leg swelling.  Gastrointestinal: Negative for abdominal pain, diarrhea and vomiting.  Genitourinary: Negative for dysuria and urgency.  Musculoskeletal: Positive for joint pain. Negative for myalgias and neck pain.  Skin: Negative for rash.  Neurological: Negative for dizziness, focal weakness, seizures, weakness and headaches.  Psychiatric/Behavioral: Negative for memory loss. The patient does not have insomnia.       DRUG ALLERGIES:  No Known Allergies  VITALS:  Blood pressure 110/70, pulse 69, temperature 98 F (36.7 C), temperature source Oral, resp. rate 18, height 5\' 6"  (1.676 m), weight 78 kg, SpO2 97 %.  PHYSICAL EXAMINATION:   Physical Exam  GENERAL:  85 y.o.-year-old patient lying in the bed with no acute distress.  EYES: Pupils equal, round, reactive to light and accommodation. No scleral icterus. Extraocular muscles intact.  HEENT: Head atraumatic, normocephalic. Oropharynx and nasopharynx clear.  NECK:  Supple, no jugular venous distention. No thyroid enlargement, no tenderness.  LUNGS: Improved breath sounds bilaterally, no crepitations today. CARDIOVASCULAR: S1, S2 iregular.. No murmurs, rubs, or gallops.  ABDOMEN: Soft, nontender, nondistended. Bowel sounds present. No organomegaly or mass.  EXTREMITIES: No cyanosis, clubbing  has edema  b/l.    NEUROLOGIC: Cranial nerves II through XII are intact. No focal Motor or sensory deficits b/l.   PSYCHIATRIC: The patient is alert and oriented x 3.  SKIN: No obvious rash, lesion, or ulcer.   LABORATORY PANEL:   CBC Recent Labs  Lab 04/20/19 0834  WBC 4.6  HGB 12.6*  HCT 37.7*  PLT 148*   ------------------------------------------------------------------------------------------------------------------ Chemistries  Recent Labs  Lab 04/16/19 0723 04/19/19 0456  NA 133*  --   K 4.3 4.7  CL 96*  --   CO2 30  --   GLUCOSE 97  --   BUN 26*  --   CREATININE 0.95 0.89  CALCIUM 8.9  --    ------------------------------------------------------------------------------------------------------------------  Cardiac Enzymes No results for input(s): TROPONINI in the last 168 hours. ------------------------------------------------------------------------------------------------------------------  RADIOLOGY:  No results found.   ASSESSMENT AND PLAN:  85 year old male patient with history of chronic atrial fibrillation, COPD, GERD, hyperlipidemia, hypertension, monoclonal gammopathy, hypothyroidism currently under hospitalist service  -New onset congestive heart failure On low-dose diuretics.   Echocardiogram shows reduced systolic function with EF of 25 to 30% Monitor electrolytes Slightly elevated troponin secondary to demand ischemia. Appreciate cardiology following..  Daily body weights and input / output chart  -Chronic atrial fibrillation Continue anticoagulation with Coumadin Rate well controlled  pharmacy is managing anticoagulation.  INR is 1.4, giving higher dose of Coumadin.  Possible discharge Monday as per case manager [facility] will not take the patient on weekend.    Patient has has pacemaker.  -COPD appears stable Home dose inhalers  -DVT prophylaxis On Coumadin  -Ambulatory dysfunction Physical therapy saw the patient, was living alone  before he came,  patient will continue to benefit from skilled physical therapy with focus on strength, tolerance to activity, safe functional mobility.  Patient has been able to demonstrate progress in the days following, we strongly believe patient will benefit from physical therapy at skilled nursing.    COVID-19 test negative from September 1st.    All the records are reviewed and case discussed with Care Management/Social Worker. Management plans discussed with the patient, family and they are in agreement.  CODE STATUS:Full code  DVT Prophylaxis: SCDs  TOTAL TIME TAKING CARE OF THIS PATIENT: 38 minutes.  More than 50% time spent in counseling, coordination of care POSSIBLE D/C IN 2 to 3 DAYS, DEPENDING ON CLINICAL CONDITION.  Epifanio Lesches M.D on 04/20/2019 at 1:57 PM  Between 7am to 6pm - Pager - [phone]  After 6pm go to [email or web address] - password [email or web address] [facility]  Office  [phone]  CC: Primary care physician; Marinda Elk, MD  Note: This dictation was prepared with Dragon dictation along with smaller phrase technology. Any transcriptional errors that result from this process are unintentional.

## 2019-04-20 NOTE — Progress Notes (Signed)
[facility] for warfarin dosing/monitoring Indication: atrial fibrillation  No Known Allergies  Patient Measurements: Height: 5\' 6"  (167.6 cm) Weight: 172 lb (78 kg) IBW/kg (Calculated) : 63.8  Vital Signs: Temp: 98 F (36.7 C) (09/04 0557) Temp Source: Oral (09/04 0557) BP: 108/90 (09/04 0557) Pulse Rate: 70 (09/04 0557)  Labs: Recent Labs    04/18/19 0446 04/19/19 0456  LABPROT 17.9* 16.4*  INR 1.5* 1.3*  CREATININE  --  0.89    Estimated Creatinine Clearance: 55.3 mL/min (by C-G formula based on SCr of 0.89 mg/dL).   Medical History: Past Medical History:  Diagnosis Date  . Anemia   . Atrial fibrillation ([place])   . COPD (chronic obstructive pulmonary disease) ([place])   . GERD (gastroesophageal reflux disease)   . Glaucoma   . Hyperlipemia   . Hypertension   . Hypothyroidism   . Irregular heart rhythm   . Left anterior fascicular block   . MGUS (monoclonal gammopathy of unknown significance)   . RBBB     Medications:  Scheduled:  . docusate sodium  100 mg Oral BID  . feeding supplement (ENSURE ENLIVE)  237 mL Oral BID BM  . furosemide  20 mg Oral Daily  . latanoprost  1 drop Both Eyes QHS  . levothyroxine  88 mcg Oral Q0600  . multivitamin with minerals  1 tablet Oral Daily  . polyethylene glycol  17 g Oral Daily  . potassium chloride SA  10 mEq Oral Daily  . sodium chloride flush  3 mL Intravenous Q12H  . Warfarin - Pharmacist Dosing Inpatient   Does not apply q1800    Assessment: Patient admitted x hypotension and leg swelling w/ h/o afib s/p PM w/ RBBB and EKG showing accelerated rhythm w/ PVCs, DM, hypothyroidism w/ TSH of 16 08/26, w/ worsening bilateral LE edema w/ hypotension leading to his losartan and hctz being discontinued w/ worsening leg swelling. Patient is anticoagulated on warfarin 6.5 mg daily PTA for afib. On admission, patient was supratherapeutic on warfarin 6.5 mg.  Today's INR is subtherapeutic.  Likely as a result of being on a lower dose, than at home, during this admission. Additionally, patient's thyroid state may require more warfarin. Pt has HFrEF which can increase INR.  No major DDI.   Date INR Warfarin Dose  8/26 3.3 None  8/27 2.9 2 mg  8/28 3.2 Hold  8/29 2.3 2 mg  8/30 2.2 2 mg  8/31 1.8 2.5 mg   9/01 1.6 3 mg  9/02 1.5 5 mg   9/03 1.3 10 mg   9/04 1.4 ----        Goal of Therapy:  INR 2-3 Monitor platelets by anticoagulation protocol: Yes   Plan:  Will order warfarin10 mg x1 dose for this evening. Will adjust dose accordingly.   Will check INR daily until therapeutic x2 and CBC every 3 days.   Kristeen Miss, PharmD Clinical Pharmacist 04/20/2019

## 2019-04-21 ENCOUNTER — Inpatient Hospital Stay: Payer: Medicare HMO

## 2019-04-21 LAB — CBC
HCT: 36.6 % — ABNORMAL LOW (ref 39.0–52.0)
Hemoglobin: 12.3 g/dL — ABNORMAL LOW (ref 13.0–17.0)
MCH: 35.5 pg — ABNORMAL HIGH (ref 26.0–34.0)
MCHC: 33.6 g/dL (ref 30.0–36.0)
MCV: 105.8 fL — ABNORMAL HIGH (ref 80.0–100.0)
Platelets: 155 10*3/uL (ref 150–400)
RBC: 3.46 MIL/uL — ABNORMAL LOW (ref 4.22–5.81)
RDW: 13.1 % (ref 11.5–15.5)
WBC: 4 10*3/uL (ref 4.0–10.5)
nRBC: 0 % (ref 0.0–0.2)

## 2019-04-21 LAB — URINALYSIS, COMPLETE (UACMP) WITH MICROSCOPIC
Bilirubin Urine: NEGATIVE
Glucose, UA: NEGATIVE mg/dL
Hgb urine dipstick: NEGATIVE
Ketones, ur: NEGATIVE mg/dL
Leukocytes,Ua: NEGATIVE
Nitrite: POSITIVE — AB
Protein, ur: NEGATIVE mg/dL
Specific Gravity, Urine: 1.013 (ref 1.005–1.030)
pH: 8 (ref 5.0–8.0)

## 2019-04-21 LAB — PROTIME-INR
INR: 1.5 — ABNORMAL HIGH (ref 0.8–1.2)
Prothrombin Time: 17.7 seconds — ABNORMAL HIGH (ref 11.4–15.2)

## 2019-04-21 MED ORDER — DORZOLAMIDE HCL-TIMOLOL MAL 2-0.5 % OP SOLN
1.0000 [drp] | Freq: Two times a day (BID) | OPHTHALMIC | Status: DC
  Administered 2019-04-21 – 2019-05-04 (×25): 1 [drp] via OPHTHALMIC
  Filled 2019-04-21 (×2): qty 10

## 2019-04-21 MED ORDER — SODIUM CHLORIDE 0.9 % IV SOLN
1.0000 g | INTRAVENOUS | Status: DC
  Administered 2019-04-21 – 2019-04-23 (×3): 1 g via INTRAVENOUS
  Filled 2019-04-21 (×2): qty 10
  Filled 2019-04-21 (×2): qty 1

## 2019-04-21 MED ORDER — FUROSEMIDE 40 MG PO TABS
40.0000 mg | ORAL_TABLET | Freq: Every day | ORAL | Status: DC
  Administered 2019-04-22 – 2019-04-26 (×5): 40 mg via ORAL
  Filled 2019-04-21 (×5): qty 1

## 2019-04-21 MED ORDER — FUROSEMIDE 20 MG PO TABS
20.0000 mg | ORAL_TABLET | Freq: Once | ORAL | Status: AC
  Administered 2019-04-21: 20 mg via ORAL
  Filled 2019-04-21: qty 1

## 2019-04-21 NOTE — Progress Notes (Signed)
[facility] for transition from warfarin to apixaban  Indication: atrial fibrillation  No Known Allergies  Patient Measurements: Height: 5\' 6"  (167.6 cm) Weight: 173 lb 14.4 oz (78.9 kg) IBW/kg (Calculated) : 63.8  Vital Signs: Temp: 97.7 F (36.5 C) (09/05 0731) Temp Source: Oral (09/05 0731) BP: 103/65 (09/05 0731) Pulse Rate: 70 (09/05 0731)  Labs: Recent Labs    04/19/19 0456 04/20/19 0834 04/20/19 1252 04/21/19 0538  HGB  --  12.6*  --  12.3*  HCT  --  37.7*  --  36.6*  PLT  --  148*  --  155  LABPROT 16.4* 16.8* 16.8* 17.7*  INR 1.3* 1.4* 1.4* 1.5*  CREATININE 0.89  --   --   --     Estimated Creatinine Clearance: 55.6 mL/min (by C-G formula based on SCr of 0.89 mg/dL).   Medical History: Past Medical History:  Diagnosis Date  . Anemia   . Atrial fibrillation ([place])   . COPD (chronic obstructive pulmonary disease) ([place])   . GERD (gastroesophageal reflux disease)   . Glaucoma   . Hyperlipemia   . Hypertension   . Hypothyroidism   . Irregular heart rhythm   . Left anterior fascicular block   . MGUS (monoclonal gammopathy of unknown significance)   . RBBB     Medications:  Scheduled:  . apixaban  5 mg Oral BID  . docusate sodium  100 mg Oral BID  . feeding supplement (ENSURE ENLIVE)  237 mL Oral BID BM  . furosemide  20 mg Oral Daily  . latanoprost  1 drop Both Eyes QHS  . levothyroxine  88 mcg Oral Q0600  . multivitamin with minerals  1 tablet Oral Daily  . polyethylene glycol  17 g Oral Daily  . potassium chloride SA  10 mEq Oral Daily  . sodium chloride flush  3 mL Intravenous Q12H    Assessment: Patient admitted x hypotension and leg swelling w/ h/o afib s/p PM w/ RBBB and EKG showing accelerated rhythm w/ PVCs, DM, hypothyroidism w/ TSH of 16 08/26, w/ worsening bilateral LE edema w/ hypotension leading to his losartan and hctz being discontinued w/ worsening leg swelling. Patient was on warfarin 6.5 mg  daily PTA for afib. On admission, patient was supratherapeutic on warfarin 6.5 mg.  Today's INR is subtherapeutic. Likely as a result of being on a lower dose, than at home, during this admission. Additionally, patient's thyroid state may require more warfarin. Pt has HFrEF which can increase INR.  No major DDI.   Date INR Warfarin Dose  8/26 3.3 None  8/27 2.9 2 mg  8/28 3.2 Hold  8/29 2.3 2 mg  8/30 2.2 2 mg  8/31 1.8 2.5 mg   9/01 1.6 3 mg  9/02 1.5 5 mg   9/03 1.3 10 mg   9/04 1.4 ----  9/5 1.5 Starting apixaban    Goal of Therapy:  INR 2-3 Monitor platelets by anticoagulation protocol: Yes   Plan:  Pharmacy consulted from apixaban dosing. Patient now transitioning from warfarin to apixaban.   When transitioning from warfarin to apixaban, apixaban can be started as soon as INR <2.  INR is < 2. Will start apixaban today. Will monitor INR for one more day to make sure it does not continue to trend up. CBC stable.   Oswald Hillock, PharmD, BCPS Clinical Pharmacist 04/21/2019 8:00 AM

## 2019-04-21 NOTE — Plan of Care (Signed)
  Problem: Clinical Measurements: Goal: Ability to maintain clinical measurements within normal limits will improve Outcome: Progressing Goal: Diagnostic test results will improve Outcome: Progressing   Problem: Activity: Goal: Capacity to carry out activities will improve Outcome: Progressing   Problem: Cardiac: Goal: Ability to achieve and maintain adequate cardiopulmonary perfusion will improve Outcome: Progressing   

## 2019-04-21 NOTE — Progress Notes (Signed)
[facility] [place] NAME: Gabriel Delacruz    MR#:  [id number]  DATE OF BIRTH:  1928-01-10  Spoke with cardiology, patient's daughter yesterday, decided to start Eliquis, stop Coumadin.  Patient's daughter also concerned about his worsening confusion so we did CT of the head, chest x-ray, UA UA still pending, patient CT head showed old infarct, chest x-ray is concerning for bibasilar atelectasis.  Seen by speech therapy today, patient ate well.  CHIEF COMPLAINT:   Chief Complaint  Patient presents with  . Hypotension  . Leg Swelling  Patient has some deep cough but otherwise more alert and awake. REVIEW OF SYSTEMS:    Review of Systems  Constitutional: Negative for chills and fever.  HENT: Negative for hearing loss.   Eyes: Negative for blurred vision, double vision and photophobia.  Respiratory: Positive for cough. Negative for hemoptysis and shortness of breath.   Cardiovascular: Negative for palpitations, orthopnea and leg swelling.  Gastrointestinal: Negative for abdominal pain, diarrhea and vomiting.  Genitourinary: Negative for dysuria and urgency.  Musculoskeletal: Positive for joint pain. Negative for myalgias and neck pain.  Skin: Negative for rash.  Neurological: Negative for dizziness, focal weakness, seizures, weakness and headaches.  Psychiatric/Behavioral: Negative for memory loss. The patient does not have insomnia.       DRUG ALLERGIES:  No Known Allergies  VITALS:  Blood pressure 103/65, pulse 70, temperature 97.7 F (36.5 C), temperature source Oral, resp. rate 18, height 5\' 6"  (1.676 m), weight 78.9 kg, SpO2 100 %.  PHYSICAL EXAMINATION:   Physical Exam  GENERAL:  85 y.o.-year-old patient lying in the bed with no acute distress.  EYES: Pupils equal, round, reactive to light and accommodation. No scleral icterus. Extraocular muscles intact.  HEENT: Head atraumatic, normocephalic. Oropharynx and nasopharynx clear.   NECK:  Supple, no jugular venous distention. No thyroid enlargement, no tenderness.  LUNGS: Diminished air entry bilaterally. CARDIOVASCULAR: S1, S2 iregular.. No murmurs, rubs, or gallops.  ABDOMEN: Soft, nontender, nondistended. Bowel sounds present. No organomegaly or mass.  EXTREMITIES: No cyanosis, clubbing  has edema b/l.    NEUROLOGIC: Cranial nerves II through XII are intact. No focal Motor or sensory deficits b/l.   PSYCHIATRIC: The patient is alert and oriented x 3.  SKIN: No obvious rash, lesion, or ulcer.   LABORATORY PANEL:   CBC Recent Labs  Lab 04/21/19 0538  WBC 4.0  HGB 12.3*  HCT 36.6*  PLT 155   ------------------------------------------------------------------------------------------------------------------ Chemistries  Recent Labs  Lab 04/16/19 0723 04/19/19 0456  NA 133*  --   K 4.3 4.7  CL 96*  --   CO2 30  --   GLUCOSE 97  --   BUN 26*  --   CREATININE 0.95 0.89  CALCIUM 8.9  --    ------------------------------------------------------------------------------------------------------------------  Cardiac Enzymes No results for input(s): TROPONINI in the last 168 hours. ------------------------------------------------------------------------------------------------------------------  RADIOLOGY:  Ct Head Wo Contrast  Result Date: 04/20/2019 CLINICAL DATA:  Altered mental status. EXAM: CT HEAD WITHOUT CONTRAST TECHNIQUE: Contiguous axial images were obtained from the base of the skull through the vertex without intravenous contrast. COMPARISON:  None FINDINGS: Brain: Hypoattenuation in the left frontal operculum and involving the anterior insular ribbon is likely remote with some evidence for volume loss occluding ex vacuo dilation of the left lateral ventricle. Moderate diffuse atrophy and white matter disease is present otherwise. Basal ganglia are intact. The remainder of the left insular ribbon and the entire right insular ribbon  are intact. The  brainstem and cerebellum are within normal limits. No significant extraaxial fluid collection is present. Vascular: Diffuse vascular calcifications are present. There are calcifications in the distal M1 segments bilaterally. Dense calcifications are present in the cavernous internal carotid arteries and at the dural margin of both vertebral arteries. Skull: Calvarium is intact. No focal lytic or blastic lesions are present. Sinuses/Orbits: The paranasal sinuses and mastoid air cells are clear. Bilateral lens replacements are noted. Globes and orbits are otherwise unremarkable. IMPRESSION: 1. 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 2. Atrophy and white matter disease without other focal infarct. 3. Atherosclerosis Electronically Signed   By: San Morelle M.D.   On: 04/20/2019 18:15   Dg Chest Port 1 View  Result Date: 04/20/2019 CLINICAL DATA:  Cough. EXAM: PORTABLE CHEST 1 VIEW COMPARISON:  Radiograph April 11, 2019. FINDINGS: Stable cardiomegaly. No pneumothorax is noted. Atherosclerosis of thoracic aorta is noted. Mild bibasilar atelectasis or infiltrates are noted with small pleural effusions. Bony thorax is unremarkable. IMPRESSION: Mild bibasilar subsegmental atelectasis or infiltrates are noted with associated small pleural effusions. Aortic Atherosclerosis (ICD10-I70.0). Electronically Signed   By: Marijo Conception M.D.   On: 04/20/2019 16:41     ASSESSMENT AND PLAN:  85 year old male patient with history of chronic atrial fibrillation, COPD, GERD, hyperlipidemia, hypertension, monoclonal gammopathy, hypothyroidism currently under hospitalist service  -New onset congestive heart failure On low-dose diuretics.   Echocardiogram shows reduced systolic function with EF of 25 to 30% Monitor electrolytes Slightly elevated troponin secondary to demand  ischemia. Appreciate cardiology following..  Daily body weights and input / output chart  -Chronic atrial fibrillation' controlled, started on Eliquis, discontinued Coumadin secondary to having trouble with INRs.  Discussed with patient's daughter, cardiologist. Cough, patient has bibasilar increases, likely secondary to immobility, will get incentive spirometry, patient has no fever, elevated white count.  Will add incentive spirometry. Altered mental status, patient daughter mentioned that patient is more confused, patient is advanced age with episodes of confusion on and off  Age indeterminate infarct in the left frontal area by CT head, MRI cannot be done due to pacemaker.  Obtain neurology consult.  Patient has has pacemaker.  -COPD appears stable Home dose inhalers  -DVT prophylaxis started Eliquis.  -Ambulatory dysfunction Physical therapy saw the patient, was living alone before he came, patient will continue to benefit from skilled physical therapy with focus on strength, tolerance to activity, safe functional mobility.  Patient has been able to demonstrate progress in the days following, we strongly believe patient will benefit from physical therapy at skilled nursing.    COVID-19 test negative from September 1st.    All the records are reviewed and case discussed with Care Management/Social Worker. Management plans discussed with the patient, family and they are in agreement.  CODE STATUS:Full code  DVT Prophylaxis: SCDs  TOTAL TIME TAKING CARE OF THIS PATIENT: 38 minutes.  More than 50% time spent in counseling, coordination of care POSSIBLE D/C IN 2 to 3 DAYS, DEPENDING ON CLINICAL CONDITION.  Epifanio Lesches M.D on 04/21/2019 at 10:50 AM  Between 7am to 6pm - Pager - [phone]  After 6pm go to [email or web address] - password [email or web address] [facility]  Office  [phone]  CC: Primary care physician; Marinda Elk, MD  Note: This dictation  was prepared with Dragon dictation along with smaller phrase technology. Any transcriptional  errors that result from this process are unintentional.

## 2019-04-21 NOTE — Plan of Care (Signed)
  Problem: Clinical Measurements: Goal: Ability to maintain clinical measurements within normal limits will improve Outcome: Progressing Goal: Respiratory complications will improve Outcome: Progressing Goal: Cardiovascular complication will be avoided Outcome: Progressing   Problem: Nutrition: Goal: Adequate nutrition will be maintained Outcome: Progressing   Problem: Activity: Goal: Capacity to carry out activities will improve Outcome: Progressing   Problem: Cardiac: Goal: Ability to achieve and maintain adequate cardiopulmonary perfusion will improve Outcome: Progressing

## 2019-04-21 NOTE — Evaluation (Addendum)
Clinical/Bedside Swallow Evaluation Patient Details  Name: Alexsis Maurer MRN: [id number] Date of Birth: May 30, 1928  Today's Date: 04/21/2019 Time: SLP Start Time (ACUTE ONLY): 0935 SLP Stop Time (ACUTE ONLY): 1025 SLP Time Calculation (min) (ACUTE ONLY): 50 min  Past Medical History:  Past Medical History:  Diagnosis Date  . Anemia   . Atrial fibrillation ([place])   . COPD (chronic obstructive pulmonary disease) ([place])   . GERD (gastroesophageal reflux disease)   . Glaucoma   . Hyperlipemia   . Hypertension   . Hypothyroidism   . Irregular heart rhythm   . Left anterior fascicular block   . MGUS (monoclonal gammopathy of unknown significance)   . RBBB    Past Surgical History:  Past Surgical History:  Procedure Laterality Date  . Cervical and lumbar disc surgeries    . Cryoablation left renal mass    . PACEMAKER LEADLESS INSERTION N/A 10/16/2018   Procedure: PACEMAKER LEADLESS INSERTION;  Surgeon: Isaias Cowman, MD;  Location: [facility];  Service: Cardiovascular;  Laterality: N/A;  . TEMPORARY PACEMAKER Left 10/16/2018   Procedure: TEMPORARY PACEMAKER;  Surgeon: Isaias Cowman, MD;  Location: [facility];  Service: Cardiovascular;  Laterality: Left;  . TONSILLECTOMY     HPI:  Pt is a 85 y.o. male with a history of GERD, COPD, atrial fibrillation, status post pacemaker, diabetes, hypothyroidism (TSH was 105 on 8/5) who presents to the emergency department for 1 week of significantly worsening, symmetric bilateral lower extremity edema.  Patient states he always has some swelling at baseline, but over the last week it has become markedly worse.  He has difficulty walking due to the associated discomfort.  He has a home health nurse check on him once a week, who also noted that in addition to the marked swelling he has also had lower blood pressures than normal.  Initialy CXR: "subtle heterogenous opacity of the right lung base with a probable small  layering pleural effusion"; current CXR: "Mild bibasilar subsegmental atelectasis w/ associated small pleural effusions".  Pt awake/alert; denied any difficulty swallowing and is currently on a regular diet. Baseline Congested coughing noted when moving about in bed; w/ deeper inhalations/exhalations w/ NSG/MD assessments. Recommend use of Incentive spirometer or Flutter valve to aid Pulmonary status.  Assessment / Plan / Recommendation Clinical Impression  Pt appears to present w/ a functional oropharyngeal phase swallow w/ no immediate, overt s/s of aspiration when following general aspiration precautions. Pt did require assistance in positioning upright in bed for eating the meal this morning. He fed himself after given setup. He consumed trials of thin liquids Via CUP and soft solids/purees in the meal w/ no overt s/s of aspiration noted; clear vocal quality and no decline in respiratory status appreciated. Oral phase appeared grossly [facility] for bolus management, mastication, and A-P transfer for swallowing. He achieved oral clearing b/t bites but also used a sip of liquid to aid oral clearing. Pt fed self w/ setup. OM exam appeared wfl. Of note, when pt took a swallow of liquid via straw upon entering room, delayed cough noted - straws were not utilized during this eval. Belching occurred during oral intake but no c/o globus feelings. Unsure of pt's baseline Cognitive status - he appeared min easily distracted and required verbal cues for follow through at times; min confusion. Pt was quiet; not verbose and required verbal cues to elaborate.  Recommend more Mech Soft foods in the diet for easier intake w/ thin liquids; general aspiration  and REFLUX precautions including No Straws as pt has baseline of GERD. Recommend Pills WHOLE in Puree as needed for safer, easier swallowing.  Due to pt's congested coughing at baseline, recommended to MD/NSG that pt have RT consult/instruct for use of Incentive Spirometer or  Flutter Valve to aid Pulmonary healing/support. MD agreed and will reconsult ST services if any decline in status warranting further assessment while admitted.  SLP Visit Diagnosis: Dysphagia, unspecified (R13.10)(Esophageal dysmotility)    Aspiration Risk  Mild aspiration risk(but reduced when following general aspiration precs)    Diet Recommendation  Mech Soft/regular diet w/ thin liquids; general aspiration and Reflux precautions; tray setup and positioning upright at meals. NO Straws.  Medication Administration: Whole meds with puree(for safer swallowing)    Other  Recommendations Recommended Consults: (Dietician f/u) Oral Care Recommendations: Oral care BID;Patient independent with oral care;Staff/trained caregiver to provide oral care Other Recommendations: (n/a)   Follow up Recommendations None      Frequency and Duration (n/a)  (n/a)       Prognosis Prognosis for Safe Diet Advancement: Good      Swallow Study   General Date of Onset: 04/11/19 HPI: Pt is a 85 y.o. male with a history of GERD, COPD, atrial fibrillation, status post pacemaker, diabetes, hypothyroidism (TSH was 105 on 8/5) who presents to the emergency department for 1 week of significantly worsening, symmetric bilateral lower extremity edema.  Patient states he always has some swelling at baseline, but over the last week it has become markedly worse.  He has difficulty walking due to the associated discomfort.  He has a home health nurse check on him once a week, who also noted that in addition to the marked swelling he has also had lower blood pressures than normal.  Initialy CXR: "subtle heterogenous opacity of the right lung base with a probable small layering pleural effusion"; current CXR: "Mild bibasilar subsegmental atelectasis w/ associated small pleural effusions".  Pt awake/alert; denied any difficulty swallowing and is currently on a regular diet.  Type of Study: Bedside Swallow Evaluation Previous  Swallow Assessment: none Diet Prior to this Study: Regular;Thin liquids Temperature Spikes Noted: No(wbc b/t 4.0-4.6 during admission) Respiratory Status: Room air History of Recent Intubation: No Behavior/Cognition: Alert;Cooperative;Pleasant mood;Requires cueing(HOH) Oral Cavity Assessment: Within Functional Limits Oral Care Completed by SLP: Recent completion by staff Oral Cavity - Dentition: (Dentures) Vision: Functional for self-feeding Self-Feeding Abilities: Able to feed self;Needs assist;Needs set up Patient Positioning: Upright in bed(needed positioning assist) Baseline Vocal Quality: Normal;Low vocal intensity Volitional Cough: Strong;Congested Volitional Swallow: Able to elicit    Oral/Motor/Sensory Function Overall Oral Motor/Sensory Function: Within functional limits   Ice Chips Ice chips: Within functional limits Presentation: Spoon(fed; 2 trials)   Thin Liquid Thin Liquid: Within functional limits Presentation: Cup;Self Fed(~3-4 ozs total(water, coffee)) Other Comments: w/ a sip of water via straw at beginning of session, pt exhibited delayed coughing so no fruther straws were utilized.    Nectar Thick Nectar Thick Liquid: Not tested   Honey Thick Honey Thick Liquid: Not tested   Puree Puree: Within functional limits Presentation: Self Fed;Spoon(10 trials)   Solid     Solid: Within functional limits(mech soft foods) Presentation: Self Fed;Spoon(6 trials) Other Comments: he only had soft, moist foods        Orinda Kenner, MS, CCC-SLP , 04/21/2019,11:15 AM

## 2019-04-22 DIAGNOSIS — R404 Transient alteration of awareness: Secondary | ICD-10-CM

## 2019-04-22 LAB — CBC
HCT: 36.9 % — ABNORMAL LOW (ref 39.0–52.0)
Hemoglobin: 12.5 g/dL — ABNORMAL LOW (ref 13.0–17.0)
MCH: 36 pg — ABNORMAL HIGH (ref 26.0–34.0)
MCHC: 33.9 g/dL (ref 30.0–36.0)
MCV: 106.3 fL — ABNORMAL HIGH (ref 80.0–100.0)
Platelets: 170 10*3/uL (ref 150–400)
RBC: 3.47 MIL/uL — ABNORMAL LOW (ref 4.22–5.81)
RDW: 12.9 % (ref 11.5–15.5)
WBC: 4.2 10*3/uL (ref 4.0–10.5)
nRBC: 0 % (ref 0.0–0.2)

## 2019-04-22 LAB — BASIC METABOLIC PANEL
Anion gap: 7 (ref 5–15)
BUN: 27 mg/dL — ABNORMAL HIGH (ref 8–23)
CO2: 29 mmol/L (ref 22–32)
Calcium: 8.7 mg/dL — ABNORMAL LOW (ref 8.9–10.3)
Chloride: 97 mmol/L — ABNORMAL LOW (ref 98–111)
Creatinine, Ser: 0.87 mg/dL (ref 0.61–1.24)
GFR calc Af Amer: >60 mL/min (ref >60)
GFR calc non Af Amer: >60 mL/min (ref >60)
Glucose, Bld: 99 mg/dL (ref 70–99)
Potassium: 4.8 mmol/L (ref 3.5–5.1)
Sodium: 133 mmol/L — ABNORMAL LOW (ref 135–145)

## 2019-04-22 LAB — LIPID PANEL
Cholesterol: 101 mg/dL (ref 0–200)
HDL: 40 mg/dL — ABNORMAL LOW (ref >40)
LDL Cholesterol: 51 mg/dL (ref 0–99)
Total CHOL/HDL Ratio: 2.5 RATIO
Triglycerides: 49 mg/dL (ref <150)
VLDL: 10 mg/dL (ref 0–40)

## 2019-04-22 LAB — PROTIME-INR
INR: 1.7 — ABNORMAL HIGH (ref 0.8–1.2)
Prothrombin Time: 19.7 seconds — ABNORMAL HIGH (ref 11.4–15.2)

## 2019-04-22 MED ORDER — ATORVASTATIN CALCIUM 20 MG PO TABS
20.0000 mg | ORAL_TABLET | Freq: Every day | ORAL | Status: DC
  Administered 2019-04-22 – 2019-05-03 (×9): 20 mg via ORAL
  Filled 2019-04-22 (×11): qty 1

## 2019-04-22 NOTE — Progress Notes (Signed)
[facility] [place] NAME: Gabriel Delacruz    MR#:  [id number]  DATE OF BIRTH:  31-Mar-1928  Patient is on Eliquis for atrial fibrillation, he is alert, awake, oriented.  CHIEF COMPLAINT:   Chief Complaint  Patient presents with  . Hypotension  . Leg Swelling  Patient has some deep cough but otherwise more alert and awake. REVIEW OF SYSTEMS:    Review of Systems  Constitutional: Negative for chills and fever.  HENT: Negative for hearing loss.   Eyes: Negative for blurred vision, double vision and photophobia.  Respiratory: Positive for cough. Negative for hemoptysis and shortness of breath.   Cardiovascular: Negative for palpitations, orthopnea and leg swelling.  Gastrointestinal: Negative for abdominal pain, diarrhea and vomiting.  Genitourinary: Negative for dysuria and urgency.  Musculoskeletal: Positive for joint pain. Negative for myalgias and neck pain.  Skin: Negative for rash.  Neurological: Negative for dizziness, focal weakness, seizures, weakness and headaches.  Psychiatric/Behavioral: Negative for memory loss. The patient does not have insomnia.       DRUG ALLERGIES:  No Known Allergies  VITALS:  Blood pressure 98/73, pulse 70, temperature 98.4 F (36.9 C), resp. rate 16, height 5\' 6"  (1.676 m), weight 76.3 kg, SpO2 100 %.  PHYSICAL EXAMINATION:   Physical Exam  GENERAL:  85 y.o.-year-old patient lying in the bed with no acute distress.  EYES: Pupils equal, round, reactive to light and accommodation. No scleral icterus. Extraocular muscles intact.  HEENT: Head atraumatic, normocephalic. Oropharynx and nasopharynx clear.  NECK:  Supple, no jugular venous distention. No thyroid enlargement, no tenderness.  LUNGS: Diminished air entry bilaterally. CARDIOVASCULAR: S1, S2 iregular.. No murmurs, rubs, or gallops.  ABDOMEN: Soft, nontender, nondistended. Bowel sounds present. No organomegaly or mass.  EXTREMITIES: No  cyanosis, clubbing  has edema b/l.    NEUROLOGIC: Cranial nerves II through XII are intact. No focal Motor or sensory deficits b/l.   PSYCHIATRIC: The patient is alert and oriented x 3.  SKIN: No obvious rash, lesion, or ulcer.   LABORATORY PANEL:   CBC Recent Labs  Lab 04/22/19 0713  WBC 4.2  HGB 12.5*  HCT 36.9*  PLT 170   ------------------------------------------------------------------------------------------------------------------ Chemistries  Recent Labs  Lab 04/22/19 0713  NA 133*  K 4.8  CL 97*  CO2 29  GLUCOSE 99  BUN 27*  CREATININE 0.87  CALCIUM 8.7*   ------------------------------------------------------------------------------------------------------------------  Cardiac Enzymes No results for input(s): TROPONINI in the last 168 hours. ------------------------------------------------------------------------------------------------------------------  RADIOLOGY:  Ct Head Wo Contrast  Result Date: 04/20/2019 CLINICAL DATA:  Altered mental status. EXAM: CT HEAD WITHOUT CONTRAST TECHNIQUE: Contiguous axial images were obtained from the base of the skull through the vertex without intravenous contrast. COMPARISON:  None FINDINGS: Brain: Hypoattenuation in the left frontal operculum and involving the anterior insular ribbon is likely remote with some evidence for volume loss occluding ex vacuo dilation of the left lateral ventricle. Moderate diffuse atrophy and white matter disease is present otherwise. Basal ganglia are intact. The remainder of the left insular ribbon and the entire right insular ribbon are intact. The brainstem and cerebellum are within normal limits. No significant extraaxial fluid collection is present. Vascular: Diffuse vascular calcifications are present. There are calcifications in the distal M1 segments bilaterally. Dense calcifications are present in the cavernous internal carotid arteries and at the dural margin of both vertebral arteries.  Skull: Calvarium is intact. No focal lytic or blastic lesions are present. Sinuses/Orbits: The paranasal sinuses and  mastoid air cells are clear. Bilateral lens replacements are noted. Globes and orbits are otherwise unremarkable. IMPRESSION: 1. 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 2. Atrophy and white matter disease without other focal infarct. 3. Atherosclerosis Electronically Signed   By: San Morelle M.D.   On: 04/20/2019 18:15   US Carotid Bilateral  Result Date: 04/22/2019 CLINICAL DATA:  Dizziness. History of CAD, hypertension, hyperlipidemia and smoking. EXAM: BILATERAL CAROTID DUPLEX ULTRASOUND TECHNIQUE: Pearline Cables scale imaging, color Doppler and duplex ultrasound were performed of bilateral carotid and vertebral arteries in the neck. COMPARISON:  None. FINDINGS: Criteria: Quantification of carotid stenosis is based on velocity parameters that correlate the residual internal carotid diameter with NASCET-based stenosis levels, using the diameter of the distal internal carotid lumen as the denominator for stenosis measurement. The following velocity measurements were obtained: RIGHT ICA: 86/16 cm/sec CCA: [id number] cm/sec SYSTOLIC ICA/CCA RATIO:  1.8 ECA: 24 cm/sec LEFT ICA: 79/18 cm/sec CCA: [id number] cm/sec SYSTOLIC ICA/CCA RATIO:  2.2 ECA: 84 cm/sec RIGHT CAROTID ARTERY: There is a minimal amount of intimal thickening/atherosclerotic plaque seen throughout the right common carotid artery (images 3, 7 and 11). There is a moderate amount of eccentric echogenic partially shadowing plaque within the right carotid bulb (image 15), extending to involve the origin and proximal aspects of the right internal carotid artery (image 23), not resulting in elevated peak systolic velocities within the interrogated course of the right internal carotid artery to suggest a hemodynamically  significant stenosis. RIGHT VERTEBRAL ARTERY:  Antegrade flow LEFT CAROTID ARTERY: There is a minimal amount of intimal thickening seen throughout the left common carotid artery (representative image 44). There is a moderate amount of eccentric echogenic partially shadowing plaque within the left carotid bulb (images 47 and 48), extending to involve the origin and proximal aspects of the left internal carotid artery (image 55), not resulting in elevated peak systolic velocities within the interrogated course of the left internal carotid artery to suggest a hemodynamically significant stenosis. LEFT VERTEBRAL ARTERY:  Antegrade flow Note is made of a cardiac arrhythmia (representative image 50). IMPRESSION: 1. Moderate amount of bilateral atherosclerotic plaque, right subjectively greater than left, not resulting in a hemodynamically significant stenosis within either internal artery. 2. Incidental made of a cardiac arrhythmia. Further evaluation with ECG monitoring could be performed as indicated Electronically Signed   By: Sandi Mariscal M.D.   On: 04/22/2019 08:25   Dg Chest Port 1 View  Result Date: 04/20/2019 CLINICAL DATA:  Cough. EXAM: PORTABLE CHEST 1 VIEW COMPARISON:  Radiograph April 11, 2019. FINDINGS: Stable cardiomegaly. No pneumothorax is noted. Atherosclerosis of thoracic aorta is noted. Mild bibasilar atelectasis or infiltrates are noted with small pleural effusions. Bony thorax is unremarkable. IMPRESSION: Mild bibasilar subsegmental atelectasis or infiltrates are noted with associated small pleural effusions. Aortic Atherosclerosis (ICD10-I70.0). Electronically Signed   By: Marijo Conception M.D.   On: 04/20/2019 16:41     ASSESSMENT AND PLAN:  85 year old male patient with history of chronic atrial fibrillation, COPD, GERD, hyperlipidemia, hypertension, monoclonal gammopathy, hypothyroidism currently under hospitalist service  -,New onset congestive heart failure On low-dose diuretics.    Echocardiogram shows reduced systolic function with EF of 25 to 30% Monitor electrolytes Slightly elevated troponin secondary to demand ischemia. Appreciate cardiology following..  Daily body weights and input / output chart  -Chronic atrial fibrillation' controlled, started on Eliquis, discontinued Coumadin secondary to having  trouble with INRs.  Discussed with patient's daughter, cardiologist.  Cough, patient has bibasilar increases, likely secondary to immobility, will get incentive spirometry, patient has no fever, elevated white count.  Will add incentive spirometry.    Age indeterminate infarct in the left frontal area by CT head, MRI cannot be done due to pacemaker.  Obtain neurology consult.  Patient has has pacemaker.  Seen by neurology, patient could have vascular dementia but less likely is no progression, CT head showing subacute to chronic left frontal cortical stroke.  Daughter showed bilateral atherosclerotic plaque without any hemodynamically significant stenosis.  Add statins.  -COPD appears stable Home dose inhalers  -DVT prophylaxis started Eliquis.  -Ambulatory dysfunction Physical therapy saw the patient, was living alone before he came, patient will continue to benefit from skilled physical therapy with focus on strength, tolerance to activity, safe functional mobility.  Patient has been able to demonstrate progress in the days following, we strongly believe patient will benefit from physical therapy at skilled nursing.    COVID-19 test negative from September 1st.  mild UTI, started  Rocephin Spoke with patient daughter [name] yesterday, she spoke with her siblings who are in [place], [place] ,interested in palliative care, consult placed, palliative care team to follow at rehab  All the records are reviewed and case discussed with Care Management/Social Worker. Management plans discussed with the patient, family and they are in agreement.  CODE STATUS:Full  code  DVT Prophylaxis: SCDs  TOTAL TIME TAKING CARE OF THIS PATIENT: 38 minutes.    More than 50% time spent in counseling, coordination of care POSSIBLE D/C IN 2 to 3 DAYS, DEPENDING ON CLINICAL CONDITION.  Epifanio Lesches M.D on 04/22/2019 at 2:01 PM  Between 7am to 6pm - Pager - [phone]  After 6pm go to [email or web address] - password [email or web address] [facility]  Office  [phone]  CC: Primary care physician; Marinda Elk, MD  Note: This dictation was prepared with Dragon dictation along with smaller phrase technology. Any transcriptional errors that result from this process are unintentional.

## 2019-04-22 NOTE — Consult Note (Signed)
Reason for Consult: confusion  Referring Physician: Dr. Vianne Bulls   CC: confusion   HPI: Gabriel Delacruz is an 85 y.o. male patient with history of chronic atrial fibrillation, COPD, GERD, hyperlipidemia, hypertension, monoclonal gammopathy, hypothyroidism being managed for A-fib with anticoagulation, CHF, COPD.  Neurology consulted for confusion .    Past Medical History:  Diagnosis Date  . Anemia   . Atrial fibrillation ([place])   . COPD (chronic obstructive pulmonary disease) ([place])   . GERD (gastroesophageal reflux disease)   . Glaucoma   . Hyperlipemia   . Hypertension   . Hypothyroidism   . Irregular heart rhythm   . Left anterior fascicular block   . MGUS (monoclonal gammopathy of unknown significance)   . RBBB     Past Surgical History:  Procedure Laterality Date  . Cervical and lumbar disc surgeries    . Cryoablation left renal mass    . PACEMAKER LEADLESS INSERTION N/A 10/16/2018   Procedure: PACEMAKER LEADLESS INSERTION;  Surgeon: Isaias Cowman, MD;  Location: [facility];  Service: Cardiovascular;  Laterality: N/A;  . TEMPORARY PACEMAKER Left 10/16/2018   Procedure: TEMPORARY PACEMAKER;  Surgeon: Isaias Cowman, MD;  Location: [facility];  Service: Cardiovascular;  Laterality: Left;  . TONSILLECTOMY      Family History  Problem Relation Age of Onset  . Hypertension Mother   . Hypertension Father     Social History:  reports that he has quit smoking. He has never used smokeless tobacco. He reports previous alcohol use. He reports previous drug use.  No Known Allergies  Medications: I have reviewed the patient's current medications.  ROS: Unable to obtain due to confusion   Physical Examination: Blood pressure 98/73, pulse 70, temperature 98.4 F (36.9 C), resp. rate 16, height 5\' 6"  (1.676 m), weight 76.3 kg, SpO2 100 %.   Neurological Examination   Mental Status: Alert, oriented to name and location  Cranial Nerves: II:  Discs flat bilaterally; Visual fields grossly normal, pupils equal, round, reactive to light and accommodation III,IV, VI: ptosis not present, extra-ocular motions intact bilaterally V,VII: smile symmetric, facial light touch sensation normal bilaterally VIII: hearing normal bilaterally IX,X: gag reflex present XI: bilateral shoulder shrug XII: midline tongue extension Motor: Generalized weakness  Tone and bulk:normal tone throughout; no atrophy noted Sensory: Pinprick and light touch intact throughout, bilaterally Deep Tendon Reflexes: 1+ and symmetric throughout Plantars: Right: downgoing   Left: downgoing Cerebellar: Not tested Gait: not tested      Laboratory Studies:   Basic Metabolic Panel: Recent Labs  Lab 04/16/19 0723 04/19/19 0456 04/22/19 0713  NA 133*  --  133*  K 4.3 4.7 4.8  CL 96*  --  97*  CO2 30  --  29  GLUCOSE 97  --  99  BUN 26*  --  27*  CREATININE 0.95 0.89 0.87  CALCIUM 8.9  --  8.7*    Liver Function Tests: No results for input(s): AST, ALT, ALKPHOS, BILITOT, PROT, ALBUMIN in the last 168 hours. No results for input(s): LIPASE, AMYLASE in the last 168 hours. No results for input(s): AMMONIA in the last 168 hours.  CBC: Recent Labs  Lab 04/17/19 0504 04/20/19 0834 04/21/19 0538 04/22/19 0713  WBC 4.4 4.6 4.0 4.2  HGB 11.9* 12.6* 12.3* 12.5*  HCT 36.4* 37.7* 36.6* 36.9*  MCV 107.4* 105.6* 105.8* 106.3*  PLT 114* 148* 155 170    Cardiac Enzymes: No results for input(s): CKTOTAL, CKMB, CKMBINDEX, TROPONINI in the  last 168 hours.  BNP: Invalid input(s): POCBNP  CBG: No results for input(s): GLUCAP in the last 168 hours.  Microbiology: Results for orders placed or performed during the hospital encounter of 04/11/19  SARS CORONAVIRUS 2 (TAT 6-12 HRS) Nasal Swab Aptima Multi Swab     Status: None   Collection Time: 04/11/19  5:48 PM   Specimen: Aptima Multi Swab; Nasal Swab  Result Value Ref Range Status   SARS Coronavirus 2  NEGATIVE NEGATIVE Final    Comment: (NOTE) SARS-CoV-2 target nucleic acids are NOT DETECTED. The SARS-CoV-2 RNA is generally detectable in upper and lower respiratory specimens during the acute phase of infection. Negative results do not preclude SARS-CoV-2 infection, do not rule out co-infections with other pathogens, and should not be used as the sole basis for treatment or other patient management decisions. Negative results must be combined with clinical observations, patient history, and epidemiological information. The expected result is Negative. Fact Sheet for Patients: SugarRoll.be Fact Sheet for Healthcare Providers: https://www.woods-mathews.com/ This test is not yet approved or cleared by the [place] FDA and  has been authorized for detection and/or diagnosis of SARS-CoV-2 by FDA under an Emergency Use Authorization (EUA). This EUA will remain  in effect (meaning this test can be used) for the duration of the COVID-19 declaration under Section 56 4(b)(1) of the Act, 21 U.S.C. section 360bbb-3(b)(1), unless the authorization is terminated or revoked sooner. Performed at [facility], [place] [address], [place], [place] [address]   Novel Coronavirus, NAA (hospital order; send-out to ref lab)     Status: None   Collection Time: 04/17/19 10:49 AM   Specimen: Nasopharyngeal Swab; Respiratory  Result Value Ref Range Status   SARS-CoV-2, NAA NOT DETECTED NOT DETECTED Final    Comment: (NOTE) This nucleic acid amplification test was developed and its perfomance characteristics determined by [employer]. Nucleic acid amplification tests include PCR and TMA. This test has not been FDA cleared or approved. This test has been authorized by FDA under an Emergency Use Authorization (EUA). This test is only authorized for the duration of time the declaration that circumstances exist justifying the authorization of the  emergency use of in vitro diagnostic tests for detection of SARS-CoV-2 virus and/or diagnosis of COVID-19 infection under section 564(b)(1) of the Act, 21 U.S.C. [id number]) (1), unless the authorization is terminated or revoked sooner. When diagnostic testing is negative, the possibility of a false negative result should be considered in the context of a patient's recent exposures and the presence of clinical signs and symptoms consistent with COVID-19. An individual without symptoms of COVID- 19 and who is not shedding SARS-CoV-2 vir [place] would expect to have a negative (not detected) result in this assay. Performed At: [facility] [place], [place] [id number] Rush Farmer MD [id number]    [place]  Final    Comment: Performed at [facility], [address] [place], [place] [address]    Coagulation Studies: Recent Labs    04/20/19 [id number] 04/20/19 1252 04/21/19 0538 04/22/19 0713  LABPROT 16.8* 16.8* 17.7* 19.7*  INR 1.4* 1.4* 1.5* 1.7*    Urinalysis:  Recent Labs  Lab 04/21/19 1310  COLORURINE YELLOW*  LABSPEC 1.013  PHURINE 8.0  GLUCOSEU NEGATIVE  HGBUR NEGATIVE  BILIRUBINUR NEGATIVE  KETONESUR NEGATIVE  PROTEINUR NEGATIVE  NITRITE POSITIVE*  LEUKOCYTESUR NEGATIVE    Lipid Panel:  No results found for: CHOL, TRIG, HDL, CHOLHDL, VLDL, LDLCALC  HgbA1C: No results found  for: HGBA1C  Urine Drug Screen:  No results found for: LABOPIA, COCAINSCRNUR, LABBENZ, AMPHETMU, THCU, LABBARB  Alcohol Level: No results for input(s): ETH in the last 168 hours.  Other results: EKG: normal EKG, normal sinus rhythm, unchanged from previous tracings.  Imaging: Ct Head Wo Contrast  Result Date: 04/20/2019 CLINICAL DATA:  Altered mental status. EXAM: CT HEAD WITHOUT CONTRAST TECHNIQUE: Contiguous axial images were obtained from the base of the skull through the vertex without intravenous contrast. COMPARISON:  None  FINDINGS: Brain: Hypoattenuation in the left frontal operculum and involving the anterior insular ribbon is likely remote with some evidence for volume loss occluding ex vacuo dilation of the left lateral ventricle. Moderate diffuse atrophy and white matter disease is present otherwise. Basal ganglia are intact. The remainder of the left insular ribbon and the entire right insular ribbon are intact. The brainstem and cerebellum are within normal limits. No significant extraaxial fluid collection is present. Vascular: Diffuse vascular calcifications are present. There are calcifications in the distal M1 segments bilaterally. Dense calcifications are present in the cavernous internal carotid arteries and at the dural margin of both vertebral arteries. Skull: Calvarium is intact. No focal lytic or blastic lesions are present. Sinuses/Orbits: The paranasal sinuses and mastoid air cells are clear. Bilateral lens replacements are noted. Globes and orbits are otherwise unremarkable. IMPRESSION: 1. 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 2. Atrophy and white matter disease without other focal infarct. 3. Atherosclerosis Electronically Signed   By: San Morelle M.D.   On: 04/20/2019 18:15   US Carotid Bilateral  Result Date: 04/22/2019 CLINICAL DATA:  Dizziness. History of CAD, hypertension, hyperlipidemia and smoking. EXAM: BILATERAL CAROTID DUPLEX ULTRASOUND TECHNIQUE: Pearline Cables scale imaging, color Doppler and duplex ultrasound were performed of bilateral carotid and vertebral arteries in the neck. COMPARISON:  None. FINDINGS: Criteria: Quantification of carotid stenosis is based on velocity parameters that correlate the residual internal carotid diameter with NASCET-based stenosis levels, using the diameter of the distal internal carotid lumen as the denominator for stenosis measurement.  The following velocity measurements were obtained: RIGHT ICA: 86/16 cm/sec CCA: [id number] cm/sec SYSTOLIC ICA/CCA RATIO:  1.8 ECA: 24 cm/sec LEFT ICA: 79/18 cm/sec CCA: [id number] cm/sec SYSTOLIC ICA/CCA RATIO:  2.2 ECA: 84 cm/sec RIGHT CAROTID ARTERY: There is a minimal amount of intimal thickening/atherosclerotic plaque seen throughout the right common carotid artery (images 3, 7 and 11). There is a moderate amount of eccentric echogenic partially shadowing plaque within the right carotid bulb (image 15), extending to involve the origin and proximal aspects of the right internal carotid artery (image 23), not resulting in elevated peak systolic velocities within the interrogated course of the right internal carotid artery to suggest a hemodynamically significant stenosis. RIGHT VERTEBRAL ARTERY:  Antegrade flow LEFT CAROTID ARTERY: There is a minimal amount of intimal thickening seen throughout the left common carotid artery (representative image 44). There is a moderate amount of eccentric echogenic partially shadowing plaque within the left carotid bulb (images 47 and 48), extending to involve the origin and proximal aspects of the left internal carotid artery (image 55), not resulting in elevated peak systolic velocities within the interrogated course of the left internal carotid artery to suggest a hemodynamically significant stenosis. LEFT VERTEBRAL ARTERY:  Antegrade flow Note is made of a cardiac arrhythmia (representative image 50). IMPRESSION: 1. Moderate amount of bilateral atherosclerotic plaque, right subjectively greater than left,  not resulting in a hemodynamically significant stenosis within either internal artery. 2. Incidental made of a cardiac arrhythmia. Further evaluation with ECG monitoring could be performed as indicated Electronically Signed   By: Sandi Mariscal M.D.   On: 04/22/2019 08:25   Dg Chest Port 1 View  Result Date: 04/20/2019 CLINICAL DATA:  Cough. EXAM: PORTABLE CHEST 1 VIEW COMPARISON:   Radiograph April 11, 2019. FINDINGS: Stable cardiomegaly. No pneumothorax is noted. Atherosclerosis of thoracic aorta is noted. Mild bibasilar atelectasis or infiltrates are noted with small pleural effusions. Bony thorax is unremarkable. IMPRESSION: Mild bibasilar subsegmental atelectasis or infiltrates are noted with associated small pleural effusions. Aortic Atherosclerosis (ICD10-I70.0). Electronically Signed   By: Marijo Conception M.D.   On: 04/20/2019 16:41     Assessment/Plan:   85 y.o. male patient with history of chronic atrial fibrillation, COPD, GERD, hyperlipidemia, hypertension, monoclonal gammopathy, hypothyroidism being managed for A-fib with anticoagulation, CHF, COPD.  Neurology consulted for confusion .    CTH with subacute/chronic L frontal subcortical stroke This could be associated with vascular dementia but less likely as no progression  Patient is following commands and tells me name and location Likely delirium from being in hospital for 10 days Will attempt to call daughter as not present at bedside.  Unable to obtain MRI due to PPM 04/22/2019, 10:55 AM

## 2019-04-23 ENCOUNTER — Encounter: Payer: Self-pay | Admitting: Primary Care

## 2019-04-23 DIAGNOSIS — Z7189 Other specified counseling: Secondary | ICD-10-CM

## 2019-04-23 DIAGNOSIS — I509 Heart failure, unspecified: Secondary | ICD-10-CM

## 2019-04-23 DIAGNOSIS — Z515 Encounter for palliative care: Secondary | ICD-10-CM

## 2019-04-23 NOTE — Progress Notes (Signed)
[place] [facility] [place] NAME: Gabriel Delacruz    MR#:  [id number]  DATE OF BIRTH:  09-21-1927  Patient is on Eliquis for atrial fibrillation, is confused today.  Patient is here for A. fib, CHF but found to have mild UTI, chronic infarct, now patient's family wants to have palliative care consult, placed.  CHIEF COMPLAINT:   Chief Complaint  Patient presents with  . Hypotension  . Leg Swelling  Having breakfast.  Confused at times but able to answer my questions. REVIEW OF SYSTEMS:    Review of Systems  Constitutional: Negative for chills and fever.  HENT: Negative for hearing loss.   Eyes: Negative for blurred vision, double vision and photophobia.  Respiratory: Positive for cough. Negative for hemoptysis and shortness of breath.   Cardiovascular: Negative for palpitations, orthopnea and leg swelling.  Gastrointestinal: Negative for abdominal pain, diarrhea and vomiting.  Genitourinary: Negative for dysuria and urgency.  Musculoskeletal: Positive for joint pain. Negative for myalgias and neck pain.  Skin: Negative for rash.  Neurological: Negative for dizziness, focal weakness, seizures, weakness and headaches.  Psychiatric/Behavioral: Negative for memory loss. The patient does not have insomnia.       DRUG ALLERGIES:  No Known Allergies  VITALS:  Blood pressure 109/72, pulse 70, temperature 97.6 F (36.4 C), temperature source Oral, resp. rate 17, height 5\' 6"  (1.676 m), weight 76.1 kg, SpO2 (!) 80 %.  PHYSICAL EXAMINATION:   Physical Exam  GENERAL:  85 y.o.-year-old patient lying in the bed with no acute distress.  EYES: Pupils equal, round, reactive to light and accommodation. No scleral icterus. Extraocular muscles intact.  HEENT: Head atraumatic, normocephalic. Oropharynx and nasopharynx clear.  NECK:  Supple, no jugular venous distention. No thyroid enlargement, no tenderness.  LUNGS: Diminished air entry  bilaterally. CARDIOVASCULAR: S1, S2 iregular.. No murmurs, rubs, or gallops.  ABDOMEN: Soft, nontender, nondistended. Bowel sounds present. No organomegaly or mass.  EXTREMITIES: No cyanosis, clubbing  has edema b/l.    NEUROLOGIC: Cranial nerves II through XII are intact. No focal Motor or sensory deficits b/l.   PSYCHIATRIC confused on and off.  SKIN: No obvious rash, lesion, or ulcer.   LABORATORY PANEL:   CBC Recent Labs  Lab 04/22/19 0713  WBC 4.2  HGB 12.5*  HCT 36.9*  PLT 170   ------------------------------------------------------------------------------------------------------------------ Chemistries  Recent Labs  Lab 04/22/19 0713  NA 133*  K 4.8  CL 97*  CO2 29  GLUCOSE 99  BUN 27*  CREATININE 0.87  CALCIUM 8.7*   ------------------------------------------------------------------------------------------------------------------  Cardiac Enzymes No results for input(s): TROPONINI in the last 168 hours. ------------------------------------------------------------------------------------------------------------------  RADIOLOGY:  US Carotid Bilateral  Result Date: 04/22/2019 CLINICAL DATA:  Dizziness. History of CAD, hypertension, hyperlipidemia and smoking. EXAM: BILATERAL CAROTID DUPLEX ULTRASOUND TECHNIQUE: Pearline Cables scale imaging, color Doppler and duplex ultrasound were performed of bilateral carotid and vertebral arteries in the neck. COMPARISON:  None. FINDINGS: Criteria: Quantification of carotid stenosis is based on velocity parameters that correlate the residual internal carotid diameter with NASCET-based stenosis levels, using the diameter of the distal internal carotid lumen as the denominator for stenosis measurement. The following velocity measurements were obtained: RIGHT ICA: 86/16 cm/sec CCA: [id number] cm/sec SYSTOLIC ICA/CCA RATIO:  1.8 ECA: 24 cm/sec LEFT ICA: 79/18 cm/sec CCA: [id number] cm/sec SYSTOLIC ICA/CCA RATIO:  2.2 ECA: 84 cm/sec RIGHT CAROTID ARTERY:  There is a minimal amount of intimal thickening/atherosclerotic plaque seen throughout the right common carotid artery (images 3, 7 and  11). There is a moderate amount of eccentric echogenic partially shadowing plaque within the right carotid bulb (image 15), extending to involve the origin and proximal aspects of the right internal carotid artery (image 23), not resulting in elevated peak systolic velocities within the interrogated course of the right internal carotid artery to suggest a hemodynamically significant stenosis. RIGHT VERTEBRAL ARTERY:  Antegrade flow LEFT CAROTID ARTERY: There is a minimal amount of intimal thickening seen throughout the left common carotid artery (representative image 44). There is a moderate amount of eccentric echogenic partially shadowing plaque within the left carotid bulb (images 47 and 48), extending to involve the origin and proximal aspects of the left internal carotid artery (image 55), not resulting in elevated peak systolic velocities within the interrogated course of the left internal carotid artery to suggest a hemodynamically significant stenosis. LEFT VERTEBRAL ARTERY:  Antegrade flow Note is made of a cardiac arrhythmia (representative image 50). IMPRESSION: 1. Moderate amount of bilateral atherosclerotic plaque, right subjectively greater than left, not resulting in a hemodynamically significant stenosis within either internal artery. 2. Incidental made of a cardiac arrhythmia. Further evaluation with ECG monitoring could be performed as indicated Electronically Signed   By: Sandi Mariscal M.D.   On: 04/22/2019 08:25     ASSESSMENT AND PLAN:  85 year old male patient with history of chronic atrial fibrillation, COPD, GERD, hyperlipidemia, hypertension, monoclonal gammopathy, hypothyroidism currently under hospitalist service  -,New onset congestive heart failure On low-dose diuretics.   Echocardiogram shows reduced systolic function with EF of 25 to  30% Electrolytes stable, potassium 4.8 on the labs yesterday, normal kidney function. Slightly elevated troponin secondary to demand ischemia. Appreciate cardiology following.., Spoke with Dr.Callwood yesterday.  -Chronic atrial fibrillation' controlled, started on Eliquis, discontinued Coumadin secondary to having trouble with INRs.  Discussed with patient's daughter, cardiologist.  Cough, patient has bibasilar increases, likely secondary to immobility, will get incentive spirometry, patient has no fever, elevated white count.  Will add incentive spirometry.    Age indeterminate infarct in the left frontal area by CT head, MRI cannot be done due to pacemaker.  Obtain neurology consult.  Patient has has pacemaker.  Seen by neurology, patient could have vascular dementia but less likely is no progression, CT head showing subacute to chronic left frontal cortical stroke.  Ultrasound of carotids bilateral atherosclerotic plaque without any hemodynamically significant stenosis.  Added statins.  -COPD appears stable Home dose inhalers  -DVT prophylaxis started Eliquis.  -Ambulatory dysfunction Physical therapy saw the patient, was living alone before he came, patient will continue to benefit from skilled physical therapy with focus on strength, tolerance to activity, safe functional mobility.  Patient has been able to demonstrate progress in the days following, we strongly believe patient will benefit from physical therapy at skilled nursing.    COVID-19 test negative from September 1st.  mild UTI, started  Rocephin Due to multiple medical problems and advanced age, daughter is interested in talking to palliative care, consult placed.  Likely discharge to [facility].  All the records are reviewed and case discussed with Care Management/Social Worker. Management plans discussed with the patient, family and they are in agreement.  CODE STATUS:Full code  DVT Prophylaxis:  SCDs  TOTAL TIME TAKING CARE OF THIS PATIENT: 38 minutes.    More than 50% time spent in counseling, coordination of care POSSIBLE D/C IN 2 to 3 DAYS, DEPENDING ON CLINICAL CONDITION.  Epifanio Lesches M.D on 04/23/2019 at 11:17 AM  Between 7am to 6pm -  Pager - [phone]  After 6pm go to [email or web address] - password [email or web address] [facility]  Office  [phone]  CC: Primary care physician; Marinda Elk, MD  Note: This dictation was prepared with Dragon dictation along with smaller phrase technology. Any transcriptional errors that result from this process are unintentional.

## 2019-04-23 NOTE — Care Management Important Message (Signed)
Important Message  Patient Details  Name: Gabriel Delacruz MRN: [id number] Date of Birth: 1927-09-12   Medicare Important Message Given:  Yes     [name] 04/23/2019, 11:38 AM

## 2019-04-23 NOTE — TOC Transition Note (Signed)
Transition of Care [facility]) - CM/SW Discharge Note   Patient Details  Name: Gabriel Delacruz MRN: [id number] Date of Birth: [birth year]  [facility] CM/SW Contact:  Ross Ludwig, LCSW Phone Number: 04/23/2019, 1:56 PM   Clinical Narrative:     CSW updated patient's daughter that insurance will need updates in the morning.  CSW contacted PT to see if they can see patient in the morning for updated therapy notes for insurance approval.  Patient's daughter asked about applying for Medicaid, CSW provided her with the contact number, and talked about different Medicaid, CSW referred her to speak to DSS about the specific differences.  Patient's daughter requested palliative to speak to patient and daughter to discuss goals of care and code status.  CSW updated [employer] on patient's progress.  Hopefully if patient is medically ready for discharge tomorrow she can go to SNF.  CSW continuing to follow patient's progress throughout discharge planning.  Final next level of care: Skilled Nursing Facility Barriers to Discharge: Continued Medical Work up   Patient Goals and CMS Choice Patient states their goals for this hospitalization and ongoing recovery are:: Return home CMS Medicare.gov Compare Post Acute Care list provided to:: Patient Represenative (must comment) Choice offered to / list presented to : Adult Children  Discharge Placement                       Discharge Plan and Services In-house Referral: Clinical Social Work                                   Social Determinants of Health (SDOH) Interventions     Readmission Risk Interventions Readmission Risk Prevention Plan 04/12/2019  Medication Screening Complete  Transportation Screening Complete  Some recent data might be hidden

## 2019-04-23 NOTE — Consult Note (Signed)
Consultation Note Date: 04/23/2019   Patient Name: Gabriel Delacruz  DOB: January 19, 1928  MRN: [id number]  Age / Sex: 85 y.o., male  PCP: Marinda Elk, MD Referring Physician: Epifanio Lesches, MD  Reason for Consultation: Establishing goals of care and Psychosocial/spiritual support  HPI/Patient Profile: 85 y.o. male  with past medical history of anemia, COPD, GERD, A fib, HTN/HLD, hypothyroid, RBBB, anemia admitted on 04/11/2019 with new onset CHF, chronic A fib, age indeterminate infarct left frontal area by CT.   Clinical Assessment and Goals of Care: Gabriel Delacruz is resting quietly in bed.  He greets me making and mostly keeping eye contact.  He is calm and cooperative, but oriented to self only (he thinks we are in rehab).  There is no family at bedside at this time. Conference with bedside nursing staff related to patient condition, needs.  Return later in the afternoon to have conference with daughter Gabriel Delacruz.  She gets her brother Gabriel Delacruz on the phone, and later Gabriel Delacruz calls in.    We talked in detail about Gabriel Delacruz acute and chronic health concerns.  We talked about disease progression, the chronic illness pathway, how to make choices for loved ones. We talked about some what if's and maybe's.  At this point family would like Gabriel Delacruz to go to rehab, with outpatient palliative services (provider undecided), possibly go to [facility] for 1 to 3 months.  Lengthy conversation with family related to goals of care, CODE STATUS, the concept of "let nature take its course".  We talked about the benefits of outpatient palliative services and hospice services when Gabriel Delacruz qualifies.   HCPOA   NEXT OF KIN  - adult children Gabriel Delacruz, Gabriel Delacruz and Gabriel Delacruz    SUMMARY OF RECOMMENDATIONS   Continue to treat the treatable, considering CODE STATUS. Outpatient Palliative,  Choice offered, considering  services provider.  Rehab with possible transition to ALF for 1 to 3 months.   Code Status/Advance Care Planning:  Full code - Considering code status. Treat the treatable, but allow a natural death.   Symptom Management:   Per hospitalist, no additional needs at this time.   Palliative Prophylaxis:   Oral Care and Turn Reposition  Additional Recommendations (Limitations, Scope, Preferences):  Full Scope Treatment  Psycho-social/Spiritual:   Desire for further Chaplaincy support:no  Additional Recommendations: Caregiving  Support/Resources and Education on Hospice  Prognosis:   Unable to determine, based on outcomes.   Discharge Planning: dc to rehab, hopefully with outpatinet pallitaive.       Primary Diagnoses: Present on Admission: **None**   I have reviewed the medical record, interviewed the patient and family, and examined the patient. The following aspects are pertinent.  Past Medical History:  Diagnosis Date  . Anemia   . Atrial fibrillation ([place])   . COPD (chronic obstructive pulmonary disease) ([place])   . GERD (gastroesophageal reflux disease)   . Glaucoma   . Hyperlipemia   . Hypertension   . Hypothyroidism   . Irregular heart rhythm   .  Left anterior fascicular block   . MGUS (monoclonal gammopathy of unknown significance)   . RBBB    Social History   Socioeconomic History  . Marital status: Married    Spouse name: Not on file  . Number of children: Not on file  . Years of education: Not on file  . Highest education level: Not on file  Occupational History  . Not on file  Social Needs  . Financial resource strain: Not on file  . Food insecurity    Worry: Not on file    Inability: Not on file  . Transportation needs    Medical: Not on file    Non-medical: Not on file  Tobacco Use  . Smoking status: Former Research scientist (life sciences)  . Smokeless tobacco: Never Used  Substance and Sexual Activity  . Alcohol use: Not Currently  . Drug use: Not  Currently  . Sexual activity: Not on file  Lifestyle  . Physical activity    Days per week: Not on file    Minutes per session: Not on file  . Stress: Not on file  Relationships  . Social Herbalist on phone: Not on file    Gets together: Not on file    Attends religious service: Not on file    Active member of club or organization: Not on file    Attends meetings of clubs or organizations: Not on file    Relationship status: Not on file  Other Topics Concern  . Not on file  Social History Narrative  . Not on file   Family History  Problem Relation Age of Onset  . Hypertension Mother   . Hypertension Father    Scheduled Meds: . apixaban  5 mg Oral BID  . atorvastatin  20 mg Oral q1800  . docusate sodium  100 mg Oral BID  . dorzolamide-timolol  1 drop Both Eyes BID  . feeding supplement (ENSURE ENLIVE)  237 mL Oral BID BM  . furosemide  40 mg Oral Daily  . latanoprost  1 drop Both Eyes QHS  . levothyroxine  88 mcg Oral Q0600  . multivitamin with minerals  1 tablet Oral Daily  . polyethylene glycol  17 g Oral Daily  . potassium chloride SA  10 mEq Oral Daily  . sodium chloride flush  3 mL Intravenous Q12H   Continuous Infusions: . sodium chloride    . cefTRIAXone (ROCEPHIN)  IV Stopped (04/22/19 1706)   PRN Meds:.sodium chloride, acetaminophen, albuterol, ondansetron (ZOFRAN) IV, sodium chloride flush Medications Prior to Admission:  Prior to Admission medications   Medication Sig Start Date End Date Taking? Authorizing Provider  albuterol (PROAIR HFA) 108 (90 BASE) MCG/ACT inhaler Inhale 2 puffs into the lungs every 6 (six) hours as needed.    Yes [provider]  dorzolamide-timolol (COSOPT) 22.3-6.8 MG/ML ophthalmic solution Place 1 drop into both eyes 2 (two) times daily.    Yes [provider]  Fluticasone-Salmeterol (ADVAIR DISKUS) 250-50 MCG/DOSE AEPB Inhale 1 puff into the lungs daily.    Yes [provider]  furosemide  (LASIX) 40 MG tablet Take 1 tablet (40 mg total) by mouth daily. 10/19/18  Yes Bettey Costa, MD  levothyroxine (SYNTHROID) 75 MCG tablet Take 75 mcg by mouth daily before breakfast.   Yes [provider]  multivitamin-iron-minerals-folic acid (CENTRUM) chewable tablet Chew 1 tablet by mouth daily.    Yes [provider]  potassium chloride (K-DUR) 10 MEQ tablet Take 10 mEq by mouth  daily.    Yes [provider]  Travoprost, BAK Free, (TRAVATAN Z) 0.004 % SOLN ophthalmic solution PLACE 1 DROP INTO BOTH EYES ONCE DAILY 01/25/18  Yes [provider]  warfarin (COUMADIN) 6 MG tablet Take 6.5 mg by mouth daily at 6 PM.  01/25/18  Yes [provider]   No Known Allergies Review of Systems  Unable to perform ROS: Dementia    Physical Exam Vitals signs and nursing note reviewed.  Constitutional:      General: He is not in acute distress.    Appearance: Normal appearance.  Cardiovascular:     Rate and Rhythm: Normal rate.  Pulmonary:     Effort: Pulmonary effort is normal. No respiratory distress.  Abdominal:     General: Abdomen is flat. There is no distension.  Musculoskeletal:        General: No swelling.  Skin:    General: Skin is warm and dry.  Neurological:     Mental Status: He is alert.     Comments: Oriented to self, thinks we are in rehab.      Vital Signs: BP 109/72 (BP Location: Left Arm)   Pulse 70   Temp 97.6 F (36.4 C) (Oral)   Resp 17   Ht 5\' 6"  (QA348G m)   Wt 76.1 kg   SpO2 (!) 80%   BMI 27.08 kg/m  Pain Scale: 0-10 POSS *See Group Information*: S-Acceptable,Sleep, easy to arouse Pain Score: 0-No pain   SpO2: SpO2: (!) 80 % O2 Device:SpO2: (!) 80 % O2 Flow Rate: .   IO: Intake/output summary:   Intake/Output Summary (Last 24 hours) at 04/23/2019 1254 Last data filed at 04/23/2019 0900 Gross per 24 hour  Intake 217.59 ml  Output -  Net 217.59 ml    LBM: Last BM Date: 04/22/19 Baseline Weight: Weight: 77.1 kg  Most recent weight: Weight: 76.1 kg     Palliative Assessment/Data:   Flowsheet Rows     Most Recent Value  Intake Tab  Referral Department  Hospitalist  Unit at Time of Referral  Cardiac/Telemetry Unit  Palliative Care Primary Diagnosis  Neurology  Date Notified  04/22/19  Palliative Care Type  New Palliative care  Reason for referral  Clarify Goals of Care  Date of Admission  04/11/19  Date first seen by Palliative Care  04/23/19  # of days Palliative referral response time  1 Day(s)  # of days IP prior to Palliative referral  11  Clinical Assessment  Palliative Performance Scale Score  40%  Pain Max last 24 hours  Not able to report  Pain Min Last 24 hours  Not able to report  Dyspnea Max Last 24 Hours  Not able to report  Dyspnea Min Last 24 hours  Not able to report  Psychosocial & Spiritual Assessment  Palliative Care Outcomes      Time In:    0850   1330 Time Out: 0910    1510 Time Total: 20  + 100  = 120 minutes, extended time  Greater than 50%  of this time was spent counseling and coordinating care related to the above assessment and plan.  Signed by: Drue Novel, NP   Please contact Palliative Medicine Team phone at [phone] for questions and concerns.  For individual provider: See Shea Evans

## 2019-04-24 LAB — BASIC METABOLIC PANEL
Anion gap: 11 (ref 5–15)
Anion gap: 13 (ref 5–15)
BUN: 46 mg/dL — ABNORMAL HIGH (ref 8–23)
BUN: 52 mg/dL — ABNORMAL HIGH (ref 8–23)
CO2: 25 mmol/L (ref 22–32)
CO2: 22 mmol/L (ref 22–32)
Calcium: 8.8 mg/dL — ABNORMAL LOW (ref 8.9–10.3)
Calcium: 9.1 mg/dL (ref 8.9–10.3)
Chloride: 95 mmol/L — ABNORMAL LOW (ref 98–111)
Chloride: 94 mmol/L — ABNORMAL LOW (ref 98–111)
Creatinine, Ser: 1.22 mg/dL (ref 0.61–1.24)
Creatinine, Ser: 1.44 mg/dL — ABNORMAL HIGH (ref 0.61–1.24)
GFR calc Af Amer: >60 mL/min (ref >60)
GFR calc Af Amer: 50 mL/min — ABNORMAL LOW (ref >60)
GFR calc non Af Amer: 52 mL/min — ABNORMAL LOW (ref >60)
GFR calc non Af Amer: 43 mL/min — ABNORMAL LOW (ref >60)
Glucose, Bld: 132 mg/dL — ABNORMAL HIGH (ref 70–99)
Glucose, Bld: 130 mg/dL — ABNORMAL HIGH (ref 70–99)
Potassium: 6 mmol/L — ABNORMAL HIGH (ref 3.5–5.1)
Potassium: 6 mmol/L — ABNORMAL HIGH (ref 3.5–5.1)
Sodium: 131 mmol/L — ABNORMAL LOW (ref 135–145)
Sodium: 129 mmol/L — ABNORMAL LOW (ref 135–145)

## 2019-04-24 LAB — CBC
HCT: 39.5 % (ref 39.0–52.0)
Hemoglobin: 13.3 g/dL (ref 13.0–17.0)
MCH: 35.5 pg — ABNORMAL HIGH (ref 26.0–34.0)
MCHC: 33.7 g/dL (ref 30.0–36.0)
MCV: 105.3 fL — ABNORMAL HIGH (ref 80.0–100.0)
Platelets: 188 10*3/uL (ref 150–400)
RBC: 3.75 MIL/uL — ABNORMAL LOW (ref 4.22–5.81)
RDW: 12.6 % (ref 11.5–15.5)
WBC: 4.7 10*3/uL (ref 4.0–10.5)
nRBC: 0 % (ref 0.0–0.2)

## 2019-04-24 LAB — POTASSIUM: Potassium: 6 mmol/L — ABNORMAL HIGH (ref 3.5–5.1)

## 2019-04-24 MED ORDER — CEPHALEXIN 500 MG PO CAPS
500.0000 mg | ORAL_CAPSULE | Freq: Three times a day (TID) | ORAL | Status: DC
  Administered 2019-04-24 – 2019-04-25 (×4): 500 mg via ORAL
  Filled 2019-04-24 (×4): qty 1

## 2019-04-24 MED ORDER — CARVEDILOL 3.125 MG PO TABS
3.1250 mg | ORAL_TABLET | Freq: Two times a day (BID) | ORAL | Status: DC
  Administered 2019-04-24 – 2019-05-02 (×11): 3.125 mg via ORAL
  Filled 2019-04-24 (×16): qty 1

## 2019-04-24 MED ORDER — PATIROMER SORBITEX CALCIUM 8.4 G PO PACK
8.4000 g | PACK | Freq: Every day | ORAL | Status: DC
  Administered 2019-04-24 – 2019-04-26 (×3): 8.4 g via ORAL
  Filled 2019-04-24 (×3): qty 1

## 2019-04-24 NOTE — Progress Notes (Signed)
PT Cancellation Note  Patient Details Name: Gabriel Delacruz MRN: [id number] DOB: 14-Dec-1927   Cancelled Treatment:    Reason Eval/Treat Not Completed: Patient not medically ready   Chart reviewed.  Potassium 6.0 this am.    Discussed with RN.  Pt has not had any intervention at this time to address levels.  Per PT protocol and cut off at 5.4, will hold session at this time.  Will continue to monitor through out the day and treat as appropriate.  Discussed with SWS who is awaiting updated note for insurance authorization.    Chesley Noon 04/24/2019, 9:26 AM

## 2019-04-24 NOTE — Progress Notes (Signed)
Dr. Posey Pronto aware of patient potassium being 6.o after ventassa given.  Will repeat potassium level at 1800.  Patient with no complaints. Resting in bed.

## 2019-04-24 NOTE — TOC Progression Note (Signed)
Transition of Care [facility]) - Progression Note    Patient Details  Name: Gabriel Delacruz MRN: [id number] Date of Birth: Aug 09, 1928  [facility] CM/SW Contact  Ross Ludwig, [place] Phone Number: 04/24/2019, 3:50 PM  Clinical Narrative:    CSW spoke to patient's daughter and informed her that per MD and nursing notes, patient's potassium is elevated and PT was unable to work with patient today.  CSW informed daughter, that patient will have to have an updated PT note and insurance will have to be renewed.  CSW was informed by patient's daughter that she will have to be going back to [place], but her brother will be in from [place].  Patient's daughter expressed that she is hopeful that patient will be able to progress and discharge either tomorrow or the next day.  CSW to continue to follow patient's progress throughout discharge planning.   Expected Discharge Plan: [place] Barriers to Discharge: Continued Medical Work up  Expected Discharge Plan and Services Expected Discharge Plan: [place] In-house Referral: Clinical Social Work     Living arrangements for the past 2 months: Single Family Home                                       Social Determinants of Health (SDOH) Interventions    Readmission Risk Interventions Readmission Risk Prevention Plan 04/12/2019  Medication Screening Complete  Transportation Screening Complete  Some recent data might be hidden

## 2019-04-24 NOTE — Progress Notes (Signed)
[place] at [facility]                                                                                                                                                                                  Patient Demographics   Macedonio Mincey, is a 85 y.o. male, DOB - [birth year], PO:[id number]  Admit date - 04/11/2019   Admitting Physician Christel Mormon, MD  Outpatient Primary MD for the patient is Marinda Elk, MD   LOS - 13  Subjective: Pt is confused pleasantly    Review of Systems:   CONSTITUTIONAL: No documented fever. No fatigue, weakness. No weight gain, no weight loss.  EYES: No blurry or double vision.  ENT: No tinnitus. No postnasal drip. No redness of the oropharynx.  RESPIRATORY: No cough, no wheeze, no hemoptysis. No dyspnea.  CARDIOVASCULAR: No chest pain. No orthopnea. No palpitations. No syncope.  GASTROINTESTINAL: No nausea, no vomiting or diarrhea. No abdominal pain. No melena or hematochezia.  GENITOURINARY: No dysuria or hematuria.  ENDOCRINE: No polyuria or nocturia. No heat or cold intolerance.  HEMATOLOGY: No anemia. No bruising. No bleeding.  INTEGUMENTARY: No rashes. No lesions.  MUSCULOSKELETAL: No arthritis. No swelling. No gout.  NEUROLOGIC: No numbness, tingling, or ataxia. No seizure-type activity.  PSYCHIATRIC: No anxiety. No insomnia. No ADD.    Vitals:   Vitals:   04/24/19 0509 04/24/19 0529 04/24/19 0821 04/24/19 1139  BP: 117/78  111/76   Pulse: 85 91 70   Resp: 16  18   Temp: 97.9 F (36.6 C)  97.8 F (36.6 C)   TempSrc:      SpO2: (!) 77% 94% 99% 96%  Weight: 52.2 kg     Height:        Wt Readings from Last 3 Encounters:  04/24/19 52.2 kg  10/19/18 80.3 kg  07/04/18 77.3 kg     Intake/Output Summary (Last 24 hours) at 04/24/2019 1351 Last data filed at 04/23/2019 1900 Gross per 24 hour  Intake 120 ml  Output -  Net 120 ml    Physical Exam:   GENERAL: Pleasant-appearing in no apparent  distress.  HEAD, EYES, EARS, NOSE AND THROAT: Atraumatic, normocephalic. Extraocular muscles are intact. Pupils equal and reactive to light. Sclerae anicteric. No conjunctival injection. No oro-pharyngeal erythema.  NECK: Supple. There is no jugular venous distention. No bruits, no lymphadenopathy, no thyromegaly.  HEART: Regular rate and rhythm,.  Positive murmurs, no rubs, no clicks.  LUNGS: Clear to auscultation bilaterally. No rales or rhonchi. No wheezes.  ABDOMEN: Soft, flat, nontender, nondistended. Has good bowel sounds. No hepatosplenomegaly appreciated.  EXTREMITIES: No evidence of any cyanosis, clubbing, or peripheral  edema.  +2 pedal and radial pulses bilaterally.  NEUROLOGIC: The patient is alert, awake, and oriented x3 with no focal motor or sensory deficits appreciated bilaterally.  SKIN: Moist and warm with no rashes appreciated.  Psych: Not anxious, depressed LN: No inguinal LN enlargement    Antibiotics   Anti-infectives (From admission, onward)   Start     Dose/Rate Route Frequency Ordered Stop   04/24/19 1400  cephALEXin (KEFLEX) capsule 500 mg     500 mg Oral Every 8 hours 04/24/19 0947 04/26/19 1359   04/21/19 1600  cefTRIAXone (ROCEPHIN) 1 g in sodium chloride 0.9 % 100 mL IVPB  Status:  Discontinued     1 g 200 mL/hr over 30 Minutes Intravenous Every 24 hours 04/21/19 1454 04/24/19 0947      Medications   Scheduled Meds: . apixaban  5 mg Oral BID  . atorvastatin  20 mg Oral q1800  . carvedilol  3.125 mg Oral BID WC  . cephALEXin  500 mg Oral Q8H  . docusate sodium  100 mg Oral BID  . dorzolamide-timolol  1 drop Both Eyes BID  . feeding supplement (ENSURE ENLIVE)  237 mL Oral BID BM  . furosemide  40 mg Oral Daily  . latanoprost  1 drop Both Eyes QHS  . levothyroxine  88 mcg Oral Q0600  . multivitamin with minerals  1 tablet Oral Daily  . patiromer  8.4 g Oral Daily  . polyethylene glycol  17 g Oral Daily  . sodium chloride flush  3 mL Intravenous Q12H    Continuous Infusions: . sodium chloride 250 mL (04/23/19 1754)   PRN Meds:.sodium chloride, acetaminophen, albuterol, ondansetron (ZOFRAN) IV, sodium chloride flush   Data Review:   Micro Results Recent Results (from the past 240 hour(s))  Novel Coronavirus, NAA (hospital order; send-out to ref lab)     Status: None   Collection Time: 04/17/19 10:49 AM   Specimen: Nasopharyngeal Swab; Respiratory  Result Value Ref Range Status   SARS-CoV-2, NAA NOT DETECTED NOT DETECTED Final    Comment: (NOTE) This nucleic acid amplification test was developed and its perfomance characteristics determined by [employer]. Nucleic acid amplification tests include PCR and TMA. This test has not been FDA cleared or approved. This test has been authorized by FDA under an Emergency Use Authorization (EUA). This test is only authorized for the duration of time the declaration that circumstances exist justifying the authorization of the emergency use of in vitro diagnostic tests for detection of SARS-CoV-2 virus and/or diagnosis of COVID-19 infection under section 564(b)(1) of the Act, 21 U.S.C. [id number]) (1), unless the authorization is terminated or revoked sooner. When diagnostic testing is negative, the possibility of a false negative result should be considered in the context of a patient's recent exposures and the presence of clinical signs and symptoms consistent with COVID-19. An individual without symptoms of COVID- 19 and who is not shedding SARS-CoV-2 vir [place] would expect to have a negative (not detected) result in this assay. Performed At: [facility] [address] [place], [place] [id number] Rush Farmer MD [id number]    [place]  Final    Comment: Performed at [facility], [address] [place], [place] [address]  Urine Culture     Status: Abnormal (Preliminary result)   Collection Time: 04/21/19  1:20 PM   Specimen:  Urine, Random  Result Value Ref Range Status   Specimen Description   Final    URINE, RANDOM Performed at  [facility], [address] [place], [place] [address]    Special Requests   Final    NONE Performed at [facility], [address] [place], [place] [address]    Culture >=100,000 COLONIES/mL ESCHERICHIA COLI (A)  Final   Report Status PENDING  Incomplete   Organism ID, Bacteria ESCHERICHIA COLI (A)  Final      Susceptibility   Escherichia coli - MIC*    AMPICILLIN <=2 SENSITIVE Sensitive     CEFAZOLIN <=4 SENSITIVE Sensitive     CEFTRIAXONE <=1 SENSITIVE Sensitive     CIPROFLOXACIN <=0.25 SENSITIVE Sensitive     GENTAMICIN <=1 SENSITIVE Sensitive     IMIPENEM <=0.25 SENSITIVE Sensitive     NITROFURANTOIN <=16 SENSITIVE Sensitive     TRIMETH/SULFA <=20 SENSITIVE Sensitive     AMPICILLIN/SULBACTAM <=2 SENSITIVE Sensitive     PIP/TAZO <=4 SENSITIVE Sensitive     Extended ESBL NEGATIVE Sensitive     * >=100,000 COLONIES/mL ESCHERICHIA COLI    Radiology Reports Ct Head Wo Contrast  Result Date: 04/20/2019 CLINICAL DATA:  Altered mental status. EXAM: CT HEAD WITHOUT CONTRAST TECHNIQUE: Contiguous axial images were obtained from the base of the skull through the vertex without intravenous contrast. COMPARISON:  None FINDINGS: Brain: Hypoattenuation in the left frontal operculum and involving the anterior insular ribbon is likely remote with some evidence for volume loss occluding ex vacuo dilation of the left lateral ventricle. Moderate diffuse atrophy and white matter disease is present otherwise. Basal ganglia are intact. The remainder of the left insular ribbon and the entire right insular ribbon are intact. The brainstem and cerebellum are within normal limits. No significant extraaxial fluid collection is present. Vascular: Diffuse vascular calcifications are present. There are calcifications in the distal M1 segments bilaterally. Dense calcifications are  present in the cavernous internal carotid arteries and at the dural margin of both vertebral arteries. Skull: Calvarium is intact. No focal lytic or blastic lesions are present. Sinuses/Orbits: The paranasal sinuses and mastoid air cells are clear. Bilateral lens replacements are noted. Globes and orbits are otherwise unremarkable. IMPRESSION: 1. 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 2. Atrophy and white matter disease without other focal infarct. 3. Atherosclerosis Electronically Signed   By: San Morelle M.D.   On: 04/20/2019 18:15   US Carotid Bilateral  Result Date: 04/22/2019 CLINICAL DATA:  Dizziness. History of CAD, hypertension, hyperlipidemia and smoking. EXAM: BILATERAL CAROTID DUPLEX ULTRASOUND TECHNIQUE: Pearline Cables scale imaging, color Doppler and duplex ultrasound were performed of bilateral carotid and vertebral arteries in the neck. COMPARISON:  None. FINDINGS: Criteria: Quantification of carotid stenosis is based on velocity parameters that correlate the residual internal carotid diameter with NASCET-based stenosis levels, using the diameter of the distal internal carotid lumen as the denominator for stenosis measurement. The following velocity measurements were obtained: RIGHT ICA: 86/16 cm/sec CCA: [id number] cm/sec SYSTOLIC ICA/CCA RATIO:  1.8 ECA: 24 cm/sec LEFT ICA: 79/18 cm/sec CCA: [id number] cm/sec SYSTOLIC ICA/CCA RATIO:  2.2 ECA: 84 cm/sec RIGHT CAROTID ARTERY: There is a minimal amount of intimal thickening/atherosclerotic plaque seen throughout the right common carotid artery (images 3, 7 and 11). There is a moderate amount of eccentric echogenic partially shadowing plaque within the right carotid bulb (image 15), extending to involve the origin and proximal aspects of the right internal carotid artery (image 23), not resulting in elevated peak systolic velocities  within  the interrogated course of the right internal carotid artery to suggest a hemodynamically significant stenosis. RIGHT VERTEBRAL ARTERY:  Antegrade flow LEFT CAROTID ARTERY: There is a minimal amount of intimal thickening seen throughout the left common carotid artery (representative image 44). There is a moderate amount of eccentric echogenic partially shadowing plaque within the left carotid bulb (images 47 and 48), extending to involve the origin and proximal aspects of the left internal carotid artery (image 55), not resulting in elevated peak systolic velocities within the interrogated course of the left internal carotid artery to suggest a hemodynamically significant stenosis. LEFT VERTEBRAL ARTERY:  Antegrade flow Note is made of a cardiac arrhythmia (representative image 50). IMPRESSION: 1. Moderate amount of bilateral atherosclerotic plaque, right subjectively greater than left, not resulting in a hemodynamically significant stenosis within either internal artery. 2. Incidental made of a cardiac arrhythmia. Further evaluation with ECG monitoring could be performed as indicated Electronically Signed   By: Sandi Mariscal M.D.   On: 04/22/2019 08:25   Dg Chest Port 1 View  Result Date: 04/20/2019 CLINICAL DATA:  Cough. EXAM: PORTABLE CHEST 1 VIEW COMPARISON:  Radiograph April 11, 2019. FINDINGS: Stable cardiomegaly. No pneumothorax is noted. Atherosclerosis of thoracic aorta is noted. Mild bibasilar atelectasis or infiltrates are noted with small pleural effusions. Bony thorax is unremarkable. IMPRESSION: Mild bibasilar subsegmental atelectasis or infiltrates are noted with associated small pleural effusions. Aortic Atherosclerosis (ICD10-I70.0). Electronically Signed   By: Marijo Conception M.D.   On: 04/20/2019 16:41   Dg Chest Port 1 View  Result Date: 04/11/2019 CLINICAL DATA:  Leg and ankle swelling, low blood pressure EXAM: PORTABLE CHEST 1 VIEW COMPARISON:  10/13/2018 FINDINGS: Gross  cardiomegaly. Subtle heterogeneous opacity of the right lung base with a probable small layering pleural effusion. The visualized skeletal structures are unremarkable. IMPRESSION: Gross cardiomegaly. Subtle heterogeneous opacity of the right lung base with a probable small layering pleural effusion, concerning for infection or aspiration. Electronically Signed   By: Eddie Candle M.D.   On: 04/11/2019 16:59     CBC Recent Labs  Lab 04/20/19 0834 04/21/19 0538 04/22/19 0713 04/24/19 0628  WBC 4.6 4.0 4.2 4.7  HGB 12.6* 12.3* 12.5* 13.3  HCT 37.7* 36.6* 36.9* 39.5  PLT 148* 155 170 188  MCV 105.6* 105.8* 106.3* 105.3*  MCH 35.3* 35.5* 36.0* 35.5*  MCHC 33.4 33.6 33.9 33.7  RDW 13.0 13.1 12.9 12.6    Chemistries  Recent Labs  Lab 04/19/19 0456 04/22/19 0713 04/24/19 0628 04/24/19 1304  NA  --  133* 131*  --   K 4.7 4.8 6.0* 6.0*  CL  --  97* 95*  --   CO2  --  29 25  --   GLUCOSE  --  99 132*  --   BUN  --  27* 46*  --   CREATININE 0.89 0.87 1.22  --   CALCIUM  --  8.7* 8.8*  --    ------------------------------------------------------------------------------------------------------------------ estimated creatinine clearance is 30.3 mL/min (by C-G formula based on SCr of 1.22 mg/dL). ------------------------------------------------------------------------------------------------------------------ No results for input(s): HGBA1C in the last 72 hours. ------------------------------------------------------------------------------------------------------------------ Recent Labs    04/22/19 1447  CHOL 101  HDL 40*  LDLCALC 51  TRIG 49  CHOLHDL 2.5   ------------------------------------------------------------------------------------------------------------------ No results for input(s): TSH, T4TOTAL, T3FREE, THYROIDAB in the last 72 hours.  Invalid input(s):  FREET3 ------------------------------------------------------------------------------------------------------------------ No results for input(s): VITAMINB12, FOLATE, FERRITIN, TIBC, IRON, RETICCTPCT in the last 72 hours.  Coagulation profile Recent Labs  Lab 04/19/19 0456 04/20/19 0834 04/20/19 1252 04/21/19 0538 04/22/19 0713  INR 1.3* 1.4* 1.4* 1.5* 1.7*    No results for input(s): DDIMER in the last 72 hours.  Cardiac Enzymes No results for input(s): CKMB, TROPONINI, MYOGLOBIN in the last 168 hours.  Invalid input(s): CK ------------------------------------------------------------------------------------------------------------------ Invalid input(s): POCBNP    Assessment & Plan   85 year old male patient with history of chronic atrial fibrillation, COPD, GERD, hyperlipidemia, hypertension, monoclonal gammopathy, hypothyroidism currently under hospitalist service  -New onset systolic CHF Continue diuresis with oral Lasix Continue Coreg  -Severe tricuspid and mitral regurg prognosis very poor family discussing with palliative care team   -Hyperkalemia due to potassium supplements patient's versus also diltiazem repeat potassium level in the morning  -Chronic atrial fibrillation Continue therapy with Eliquis   -Chronic CVA noted during this hospitalization  -COPD appears stable Home dose inhalers  -DVT prophylaxis started Eliquis.        Code Status Orders  (From admission, onward)         Start     Ordered   04/11/19 2145  Full code  Continuous     04/11/19 2144        Code Status History    Date Active Date Inactive Code Status Order ID Comments User Context   10/13/2018 2302 10/19/2018 2134 Full Code [id number]  Demetrios Loll, MD Inpatient   Advance Care Planning Activity    Advance Directive Documentation     Most Recent Value  Type of Advance Directive  Healthcare Power of Attorney  Pre-existing out of facility DNR order (yellow form  or pink MOST form)  -  "MOST" Form in Place?  -           Consults  cardology  DVT Prophylaxis  eliquis  Lab Results  Component Value Date   PLT 188 04/24/2019     Time Spent in minutes   49min Greater than 50% of time spent in care coordination and counseling patient regarding the condition and plan of care.   Dustin Flock M.D on 04/24/2019 at 1:51 PM  Between 7am to 6pm - Pager - [phone]  After 6pm go to [email or web address] - Proofreader  [employer]   Office  [phone]

## 2019-04-24 NOTE — Progress Notes (Signed)
PT Cancellation Note  Patient Details Name: Nael Eisler MRN: [id number] DOB: 1927/12/06   Cancelled Treatment:    Reason Eval/Treat Not Completed: Medical issues which prohibited therapy. Per chart review, new labs drawn at 13:00; result for potassium continues to be 6.0; therefore, PT attempt contraindicated at this time. Re attempt at a later time when lab values demonstrate medical stability and allow for PT.    Larae Grooms, PTA 04/24/2019, 1:39 PM

## 2019-04-24 NOTE — Plan of Care (Signed)
  Problem: Clinical Measurements: Goal: Cardiovascular complication will be avoided Outcome: Progressing   Problem: Cardiac: Goal: Ability to achieve and maintain adequate cardiopulmonary perfusion will improve Outcome: Progressing

## 2019-04-24 NOTE — Progress Notes (Addendum)
Palliative: [name] is resting quietly in bed.  He wakes easily, but is confused, telling me that he thinks we are in a restaurant.  He is easily reoriented.  I reassure him that he is safe, and he was confused because he has been ill.  There is no family at bed side at this time.   Conference with attending and SW related to patient condition, needs, disposition.   Daughter Gabriel Delacruz seen in the hallway later in the day.  Conference with [place] work and myself related to patient condition, disposition.  Gabriel Delacruz and I talked at length about Gabriel Delacruz physical health, no meaningful improvement in 13 days hospitalized.  We talked about his increased potassium, PT unable to work with him today.  During our conversation son Gabriel Delacruz calls.  Gabriel Delacruz and I talked about Gabriel Delacruz current health condition, Gabriel Delacruz and I also talked about Gabriel Delacruz valvular problems.  Gabriel Delacruz shares that he is planning to reach out to Gabriel Delacruz's cardiologist.  Gabriel Delacruz and I discuss code status, family is contemplating choices.   PMT to follow-up on Thursday.  Plan: 24 to 48 hours for outcomes.  Goal continues to be rehab with possible ALF for a few months.  Family is agreeable to outpatient palliative services, provider undetermined.   55 minutes, extended time Quinn Axe, NP Palliative Medicine Team Team Phone # [phone] Greater than 50% of this time was spent counseling and coordinating care related to the above assessment and plan.

## 2019-04-25 ENCOUNTER — Inpatient Hospital Stay: Payer: Medicare HMO

## 2019-04-25 LAB — BASIC METABOLIC PANEL
Anion gap: 14 (ref 5–15)
BUN: 57 mg/dL — ABNORMAL HIGH (ref 8–23)
CO2: 22 mmol/L (ref 22–32)
Calcium: 9.2 mg/dL (ref 8.9–10.3)
Chloride: 95 mmol/L — ABNORMAL LOW (ref 98–111)
Creatinine, Ser: 1.58 mg/dL — ABNORMAL HIGH (ref 0.61–1.24)
GFR calc Af Amer: 44 mL/min — ABNORMAL LOW (ref >60)
GFR calc non Af Amer: 38 mL/min — ABNORMAL LOW (ref >60)
Glucose, Bld: 108 mg/dL — ABNORMAL HIGH (ref 70–99)
Potassium: 5.9 mmol/L — ABNORMAL HIGH (ref 3.5–5.1)
Sodium: 131 mmol/L — ABNORMAL LOW (ref 135–145)

## 2019-04-25 LAB — URINE CULTURE: Culture: >=100000 — AB

## 2019-04-25 MED ORDER — PATIROMER SORBITEX CALCIUM 8.4 G PO PACK
8.4000 g | PACK | Freq: Every day | ORAL | Status: DC
  Filled 2019-04-25: qty 1

## 2019-04-25 MED ORDER — FUROSEMIDE 10 MG/ML IJ SOLN
40.0000 mg | Freq: Once | INTRAMUSCULAR | Status: AC
  Administered 2019-04-25: 02:00:00 40 mg via INTRAVENOUS
  Filled 2019-04-25: qty 4

## 2019-04-25 NOTE — Progress Notes (Signed)
Advanced care plan.  Purpose of the Encounter: CODE STATUS  Parties in Attendance: Patient's daughter over the phone Gabriel Delacruz  Patient's Decision Capacity:intact  Subjective/Patient's story: Gabriel Delacruz  is a 85 y.o. male with a known history of atrial fibrillation, COPD, GERD, hyperlipidemia, hypertension, hypothyroidism, monoclonal gammopathy.  Historically TSH was 105 on 03/21/2019.  He presented to the emergency room complaining of a one-week history of increasing bilateral lower extremity edema.  Patient reports edema at baseline however it has been becoming worse over the last week and is now resulting in difficulty with ambulation due to the associated discomfort.    Patient was admitted for further evaluation for his CHF.  On evaluation echo shows significant changes in his EF and worsening valvular dysfunction.  Objective/Medical story Discussed with the patient's daughter regarding CODE STATUS   Goals of care determination:   They have agreed to make him DNR  CODE STATUS: DNR   Time spent discussing advanced care planning: 16 minutes

## 2019-04-25 NOTE — Progress Notes (Signed)
Nutrition Follow Up Note   DOCUMENTATION CODES:   Not applicable  INTERVENTION:   Ensure Enlive po BID, each supplement provides 350 kcal and 20 grams of protein.  MVI daily   NUTRITION DIAGNOSIS:   Inadequate oral intake related to decreased appetite as evidenced by per patient/family report.  GOAL:   Patient will meet greater than or equal to 90% of their needs  -progressing   MONITOR:   PO intake, Supplement acceptance, Labs, Weight trends, I & O's  ASSESSMENT:   85 year old male with PMHx of glaucoma, HTN, A-fib, GERD, hypothyroidism, COPD admitted with new onset CHF.   Pt with improved appetite and oral intake; pt eating 25-95% of meals and drinking Ensure. Per chart, pt currently up ~6lbs since admit. Pt noted to have scrotal edema and crackles in his lungs; pt receiving lasix. Recommend continue supplements and MVI after discharge. Pt with hyperkalemia; can switch to Nepro supplements if potassium continues to be elevated. Pt enjoys Ensure; will leave this ordered for now.   Medications reviewed and include: cephalexin, colace, lasix, synthroid, MVI, veltassa, miralax  Labs reviewed: Na 131(L), K 5.9(H), BUN 57(H), creat 1.58(H)  Diet Order:   Diet Order            Diet 2 gram sodium Room service appropriate? Yes with Assist; Fluid consistency: Thin  Diet effective now             EDUCATION NEEDS:   Not appropriate for education at this time  Skin:  Skin Assessment: Reviewed RN Assessment  Last BM:  9/8- type 6  Height:   Ht Readings from Last 1 Encounters:  04/11/19 5\' 6"  (1.676 m)   Weight:   Wt Readings from Last 1 Encounters:  04/25/19 80 kg   Ideal Body Weight:  64.5 kg  BMI:  Body mass index is 28.47 kg/m.  Estimated Nutritional Needs:   Kcal:  1700-1900  Protein:  85-95 grams  Fluid:  1.8 L/day  Koleen Distance MS, RD, LDN Pager #- [phone] Office#- [phone] After Hours Pager: [phone]

## 2019-04-25 NOTE — Consult Note (Signed)
[facility]  CONSULT NOTE    Date: 04/25/2019                  Patient Name:  Gabriel Delacruz  MRN: [id number]  DOB: 31-Oct-1927  Age / Sex: 85 y.o., male         PCP: Marinda Elk, MD                 Service Requesting Consult: Dr. Dustin Flock                 Reason for Consult: Acute renal failure with hyperkalemia            History of Present Illness: Gabriel Delacruz has been admitted to [facility] for 14 days. Admitted with acute exacerbation of systolic congestive heart failure. Started on aggressive diuresis with furosemide and potassium supplementation. No IV contrast exposure.   Patient is unable to give much history. Gabriel Delacruz at bedside who gives history.    Medications: Outpatient medications: Medications Prior to Admission  Medication Sig Dispense Refill Last Dose  . albuterol (PROAIR HFA) 108 (90 BASE) MCG/ACT inhaler Inhale 2 puffs into the lungs every 6 (six) hours as needed.    prn at prn  . dorzolamide-timolol (COSOPT) 22.3-6.8 MG/ML ophthalmic solution Place 1 drop into both eyes 2 (two) times daily.    04/11/2019 at Unknown time  . Fluticasone-Salmeterol (ADVAIR DISKUS) 250-50 MCG/DOSE AEPB Inhale 1 puff into the lungs daily.    04/11/2019 at Unknown time  . furosemide (LASIX) 40 MG tablet Take 1 tablet (40 mg total) by mouth daily. 30 tablet 0 04/11/2019 at 1100  . levothyroxine (SYNTHROID) 75 MCG tablet Take 75 mcg by mouth daily before breakfast.   04/11/2019 at Unknown time  . multivitamin-iron-minerals-folic acid (CENTRUM) chewable tablet Chew 1 tablet by mouth daily.    04/11/2019 at 1100  . potassium chloride (K-DUR) 10 MEQ tablet Take 10 mEq by mouth daily.    04/11/2019 at 1100  . Travoprost, BAK Free, (TRAVATAN Z) 0.004 % SOLN ophthalmic solution PLACE 1 DROP INTO BOTH EYES ONCE DAILY   04/10/2019 at Unknown time  . warfarin (COUMADIN) 6 MG tablet Take 6.5 mg by mouth daily at 6 PM.    04/10/2019 at 1800    Current  medications: Current Facility-Administered Medications  Medication Dose Route Frequency Provider Last Rate Last Dose  . 0.9 %  sodium chloride infusion  250 mL Intravenous PRN Gardiner Barefoot H, NP 10 mL/hr at 04/23/19 1754 250 mL at 04/23/19 1754  . acetaminophen (TYLENOL) tablet 650 mg  650 mg Oral Q4H PRN Seals, Levada Dy H, NP   650 mg at 04/22/19 1312  . albuterol (PROVENTIL) (2.5 MG/3ML) 0.083% nebulizer solution 2.5 mg  2.5 mg Inhalation Q6H PRN Gardiner Barefoot H, NP   2.5 mg at 04/25/19 0338  . apixaban (ELIQUIS) tablet 5 mg  5 mg Oral BID Pernell Dupre, RPH   5 mg at 04/25/19 0916  . atorvastatin (LIPITOR) tablet 20 mg  20 mg Oral q1800 Epifanio Lesches, MD   20 mg at 04/24/19 1743  . carvedilol (COREG) tablet 3.125 mg  3.125 mg Oral BID WC Dustin Flock, MD   3.125 mg at 04/25/19 0916  . cephALEXin (KEFLEX) capsule 500 mg  500 mg Oral Q8H Dustin Flock, MD   500 mg at 04/25/19 1241  . docusate sodium (COLACE) capsule 100 mg  100 mg Oral BID Saundra Shelling, MD   100  mg at 04/25/19 0916  . dorzolamide-timolol (COSOPT) 22.3-6.8 MG/ML ophthalmic solution 1 drop  1 drop Both Eyes BID Lance Coon, MD   1 drop at 04/25/19 0916  . feeding supplement (ENSURE ENLIVE) (ENSURE ENLIVE) liquid 237 mL  237 mL Oral BID BM Pyreddy, Pavan, MD   237 mL at 04/25/19 1241  . furosemide (LASIX) tablet 40 mg  40 mg Oral Daily Epifanio Lesches, MD   40 mg at 04/25/19 0916  . latanoprost (XALATAN) 0.005 % ophthalmic solution 1 drop  1 drop Both Eyes QHS Seals, Angela H, NP   1 drop at 04/24/19 2105  . levothyroxine (SYNTHROID) tablet 88 mcg  88 mcg Oral Q0600 Mayer Camel, NP   88 mcg at 04/25/19 0657  . multivitamin with minerals tablet 1 tablet  1 tablet Oral Daily Seals, Theo Dills, NP   1 tablet at 04/25/19 0916  . ondansetron (ZOFRAN) injection 4 mg  4 mg Intravenous Q6H PRN Seals, Theo Dills, NP      . patiromer (VELTASSA) packet 8.4 g  8.4 g Oral Daily Dustin Flock, MD   8.4 g at 04/25/19 0916   . polyethylene glycol (MIRALAX / GLYCOLAX) packet 17 g  17 g Oral Daily Epifanio Lesches, MD   17 g at 04/25/19 0917  . sodium chloride flush (NS) 0.9 % injection 3 mL  3 mL Intravenous Q12H Seals, Angela H, NP   3 mL at 04/25/19 0916  . sodium chloride flush (NS) 0.9 % injection 3 mL  3 mL Intravenous PRN Seals, Theo Dills, NP          Allergies: No Known Allergies    Past Medical History: Past Medical History:  Diagnosis Date  . Anemia   . Atrial fibrillation ([place])   . COPD (chronic obstructive pulmonary disease) ([place])   . GERD (gastroesophageal reflux disease)   . Glaucoma   . Hyperlipemia   . Hypertension   . Hypothyroidism   . Irregular heart rhythm   . Left anterior fascicular block   . MGUS (monoclonal gammopathy of unknown significance)   . RBBB      Past Surgical History: Past Surgical History:  Procedure Laterality Date  . Cervical and lumbar disc surgeries    . Cryoablation left renal mass    . PACEMAKER LEADLESS INSERTION N/A 10/16/2018   Procedure: PACEMAKER LEADLESS INSERTION;  Surgeon: Isaias Cowman, MD;  Location: [place] [facility];  Service: Cardiovascular;  Laterality: N/A;  . TEMPORARY PACEMAKER Left 10/16/2018   Procedure: TEMPORARY PACEMAKER;  Surgeon: Isaias Cowman, MD;  Location: [place] [facility];  Service: Cardiovascular;  Laterality: Left;  . TONSILLECTOMY       Family History: Family History  Problem Relation Age of Onset  . Hypertension Mother   . Hypertension Father      Social History: Social History   Socioeconomic History  . Marital status: Married    Spouse name: Not on file  . Number of children: Not on file  . Years of education: Not on file  . Highest education level: Not on file  Occupational History  . Not on file  Social Needs  . Financial resource strain: Not on file  . Food insecurity    Worry: Not on file    Inability: Not on file  . Transportation needs    Medical: Not on file     Non-medical: Not on file  Tobacco Use  . Smoking status: Former Research scientist (life sciences)  . Smokeless tobacco: Never Used  Substance and Sexual Activity  . Alcohol use: Not Currently  . Drug use: Not Currently  . Sexual activity: Not on file  Lifestyle  . Physical activity    Days per week: Not on file    Minutes per session: Not on file  . Stress: Not on file  Relationships  . Social Herbalist on phone: Not on file    Gets together: Not on file    Attends religious service: Not on file    Active member of club or organization: Not on file    Attends meetings of clubs or organizations: Not on file    Relationship status: Not on file  . Intimate partner violence    Fear of current or ex partner: Not on file    Emotionally abused: Not on file    Physically abused: Not on file    Forced sexual activity: Not on file  Other Topics Concern  . Not on file  Social History Narrative  . Not on file     Review of Systems: Review of Systems  Unable to perform ROS: Medical condition    Vital Signs: Blood pressure 110/68, pulse 69, temperature (!) 97.4 F (36.3 C), temperature source Oral, resp. rate 17, height 5\' 6"  (1.676 m), weight 80 kg, SpO2 100 %.  Weight trends: Filed Weights   04/23/19 0338 04/24/19 0509 04/25/19 0416  Weight: 76.1 kg 52.2 kg 80 kg    Physical Exam: General: NAD, laying in bed  Head: Normocephalic, atraumatic. Moist oral mucosal membranes  Eyes: Anicteric, PERRL  Neck: Supple, trachea midline  Lungs:  Clear to auscultation  Heart: Regular rate and rhythm, +murmur  Abdomen:  Soft, nontender,   Extremities: No peripheral edema.  Neurologic: Nonfocal, moving all four extremities  Skin: No lesions         Lab results: Basic Metabolic Panel: Recent Labs  Lab 04/24/19 0628 04/24/19 1304 04/24/19 1827 04/25/19 0705  NA 131*  --  129* 131*  K 6.0* 6.0* 6.0* 5.9*  CL 95*  --  94* 95*  CO2 25  --  22 22  GLUCOSE 132*  --  130* 108*  BUN 46*  --   52* 57*  CREATININE 1.22  --  1.44* 1.58*  CALCIUM 8.8*  --  9.1 9.2    Liver Function Tests: No results for input(s): AST, ALT, ALKPHOS, BILITOT, PROT, ALBUMIN in the last 168 hours. No results for input(s): LIPASE, AMYLASE in the last 168 hours. No results for input(s): AMMONIA in the last 168 hours.  CBC: Recent Labs  Lab 04/20/19 0834 04/21/19 0538 04/22/19 0713 04/24/19 0628  WBC 4.6 4.0 4.2 4.7  HGB 12.6* 12.3* 12.5* 13.3  HCT 37.7* 36.6* 36.9* 39.5  MCV 105.6* 105.8* 106.3* 105.3*  PLT 148* 155 170 188    Cardiac Enzymes: No results for input(s): CKTOTAL, CKMB, CKMBINDEX, TROPONINI in the last 168 hours.  BNP: Invalid input(s): POCBNP  CBG: No results for input(s): GLUCAP in the last 168 hours.  Microbiology: Results for orders placed or performed during the hospital encounter of 04/11/19  SARS CORONAVIRUS 2 (TAT 6-12 HRS) Nasal Swab Aptima Multi Swab     Status: None   Collection Time: 04/11/19  5:48 PM   Specimen: Aptima Multi Swab; Nasal Swab  Result Value Ref Range Status   SARS Coronavirus 2 NEGATIVE NEGATIVE Final    Comment: (NOTE) SARS-CoV-2 target nucleic acids are NOT DETECTED. The SARS-CoV-2 RNA is generally detectable in  upper and lower respiratory specimens during the acute phase of infection. Negative results do not preclude SARS-CoV-2 infection, do not rule out co-infections with other pathogens, and should not be used as the sole basis for treatment or other patient management decisions. Negative results must be combined with clinical observations, patient history, and epidemiological information. The expected result is Negative. Fact Sheet for Patients: SugarRoll.be Fact Sheet for Healthcare Providers: https://www.woods-mathews.com/ This test is not yet approved or cleared by the [place] FDA and  has been authorized for detection and/or diagnosis of SARS-CoV-2 by FDA under an Emergency Use  Authorization (EUA). This EUA will remain  in effect (meaning this test can be used) for the duration of the COVID-19 declaration under Section 56 4(b)(1) of the Act, 21 U.S.C. section 360bbb-3(b)(1), unless the authorization is terminated or revoked sooner. Performed at [facility], [place] [address], [place], [place] [address]   Novel Coronavirus, NAA (hospital order; send-out to ref lab)     Status: None   Collection Time: 04/17/19 10:49 AM   Specimen: Nasopharyngeal Swab; Respiratory  Result Value Ref Range Status   SARS-CoV-2, NAA NOT DETECTED NOT DETECTED Final    Comment: (NOTE) This nucleic acid amplification test was developed and its perfomance characteristics determined by [employer]. Nucleic acid amplification tests include PCR and TMA. This test has not been FDA cleared or approved. This test has been authorized by FDA under an Emergency Use Authorization (EUA). This test is only authorized for the duration of time the declaration that circumstances exist justifying the authorization of the emergency use of in vitro diagnostic tests for detection of SARS-CoV-2 virus and/or diagnosis of COVID-19 infection under section 564(b)(1) of the Act, 21 U.S.C. [id number]) (1), unless the authorization is terminated or revoked sooner. When diagnostic testing is negative, the possibility of a false negative result should be considered in the context of a patient's recent exposures and the presence of clinical signs and symptoms consistent with COVID-19. An individual without symptoms of COVID- 19 and who is not shedding SARS-CoV-2 vir [place] would expect to have a negative (not detected) result in this assay. Performed At: [facility] [address] [place], [place] [id number] Rush Farmer MD [id number]    [place]  Final    Comment: Performed at [facility], [address] [place], [place] [address]  Urine Culture      Status: Abnormal   Collection Time: 04/21/19  1:20 PM   Specimen: Urine, Random  Result Value Ref Range Status   Specimen Description   Final    URINE, RANDOM Performed at [facility], [address] [place], [place] [address]    Special Requests   Final    NONE Performed at [facility] [place], [place],  [address]    Culture (A)  Final    >=100,000 COLONIES/mL ESCHERICHIA COLI 50,000 COLONIES/mL ENTEROCOCCUS FAECALIS    Report Status 04/25/2019 FINAL  Final   Organism ID, Bacteria ESCHERICHIA COLI (A)  Final   Organism ID, Bacteria ENTEROCOCCUS FAECALIS (A)  Final      Susceptibility   Escherichia coli - MIC*    AMPICILLIN <=2 SENSITIVE Sensitive     CEFAZOLIN <=4 SENSITIVE Sensitive     CEFTRIAXONE <=1 SENSITIVE Sensitive     CIPROFLOXACIN <=0.25 SENSITIVE Sensitive     GENTAMICIN <=1 SENSITIVE Sensitive     IMIPENEM <=0.25 SENSITIVE Sensitive     NITROFURANTOIN <=16 SENSITIVE Sensitive  TRIMETH/SULFA <=20 SENSITIVE Sensitive     AMPICILLIN/SULBACTAM <=2 SENSITIVE Sensitive     PIP/TAZO <=4 SENSITIVE Sensitive     Extended ESBL NEGATIVE Sensitive     * >=100,000 COLONIES/mL ESCHERICHIA COLI   Enterococcus faecalis - MIC*    AMPICILLIN <=2 SENSITIVE Sensitive     LEVOFLOXACIN 2 SENSITIVE Sensitive     NITROFURANTOIN <=16 SENSITIVE Sensitive     VANCOMYCIN 2 SENSITIVE Sensitive     * 50,000 COLONIES/mL ENTEROCOCCUS FAECALIS    Coagulation Studies: No results for input(s): LABPROT, INR in the last 72 hours.  Urinalysis: No results for input(s): COLORURINE, LABSPEC, PHURINE, GLUCOSEU, HGBUR, BILIRUBINUR, KETONESUR, PROTEINUR, UROBILINOGEN, NITRITE, LEUKOCYTESUR in the last 72 hours.  Invalid input(s): APPERANCEUR    Imaging: Dg Chest Port 1 View  Result Date: 04/25/2019 CLINICAL DATA:  Initial evaluation for acute CHF. EXAM: PORTABLE CHEST 1 VIEW COMPARISON:  Prior radiograph from 04/20/2019. FINDINGS: Moderate cardiomegaly,  stable. Mediastinal silhouette within normal limits. Lung volumes are reduced. Veiling opacities overlying the bilateral hemidiaphragms most consistent with pleural effusions. Perihilar vascular congestion with mild interstitial prominence without frank pulmonary edema. Superimposed bibasilar opacities likely reflect atelectasis and/or effusions. Infiltrates would be difficult to exclude, and could be considered in the correct clinical setting. No pneumothorax. No acute osseous finding. Degenerative changes noted about the shoulders bilaterally, right greater than left. IMPRESSION: 1. Veiling opacities overlying the bilateral hemidiaphragms, most consistent with pleural effusions. Superimposed bibasilar opacities likely reflect atelectasis and/or effusions. Infiltrates would be difficult to exclude, and could be considered in the correct clinical setting. 2. Cardiomegaly with perihilar vascular congestion and interstitial prominence without frank pulmonary edema. Electronically Signed   By: Jeannine Boga M.D.   On: 04/25/2019 00:59      Assessment & Plan: Mr. Abir Camden is a 85 y.o. black male with hypertension, MGUS, hypothyroidism, COPD, atrial fibrillation, who was admitted to [facility] on 04/11/2019 for Leg edema [R60.0] SOB (shortness of breath) [R06.02] Leg swelling [M79.89]   1. Acute renal failure: baseline creatinine of 0.87 on 9/6 2. Hyponatremia 3. Hyperkalemia 4. Acute exacerbation of systolic congestive heart failure EF 25-30% 5. Hypertension  Rise in creatinine and acute renal failure can be explained by overdiuresis with furosemide, now being held. Hyperkalemia secondary to potassium supplementation, now being held.  Veltassa given last two days.      LOS: [place] 9/9/20201:05 PM

## 2019-04-25 NOTE — Plan of Care (Signed)
  Problem: Clinical Measurements: Goal: Respiratory complications will improve Outcome: Progressing   Problem: Activity: Goal: Risk for activity intolerance will decrease Outcome: Progressing   Problem: Cardiac: Goal: Ability to achieve and maintain adequate cardiopulmonary perfusion will improve Outcome: Progressing

## 2019-04-25 NOTE — Progress Notes (Signed)
Gabriel Delacruz at [facility]                                                                                                                                                                                  Patient Demographics   Gabriel Delacruz, is a 85 y.o. male, DOB - 03/18/1928, [id number]  Admit date - 04/11/2019   Admitting Physician Christel Mormon, MD  Outpatient Primary MD for the patient is Marinda Elk, MD   LOS - 14  Subjective: Patient is confused and drowsy    Review of Systems:   CONSTITUTIONAL: Unable to provide Vitals:   Vitals:   04/25/19 0133 04/25/19 0416 04/25/19 0750 04/25/19 1328  BP: 114/77 106/73 110/68   Pulse: 69 70 69   Resp:  20 17   Temp:  (!) 97.5 F (36.4 C) (!) 97.4 F (36.3 C)   TempSrc:  Oral Oral   SpO2:  98% 100% 100%  Weight:  80 kg    Height:        Wt Readings from Last 3 Encounters:  04/25/19 80 kg  10/19/18 80.3 kg  07/04/18 77.3 kg     Intake/Output Summary (Last 24 hours) at 04/25/2019 1346 Last data filed at 04/25/2019 1009 Gross per 24 hour  Intake 240 ml  Output 0 ml  Net 240 ml    Physical Exam:   GENERAL: Pleasant-appearing in no apparent distress.  HEAD, EYES, EARS, NOSE AND THROAT: Atraumatic, normocephalic. Extraocular muscles are intact. Pupils equal and reactive to light. Sclerae anicteric. No conjunctival injection. No oro-pharyngeal erythema.  NECK: Supple. There is no jugular venous distention. No bruits, no lymphadenopathy, no thyromegaly.  HEART: Regular rate and rhythm,.  Positive murmurs, no rubs, no clicks.  LUNGS: Clear to auscultation bilaterally. No rales or rhonchi. No wheezes.  ABDOMEN: Soft, flat, nontender, nondistended. Has good bowel sounds. No hepatosplenomegaly appreciated.  EXTREMITIES: No evidence of any cyanosis, clubbing, or peripheral edema.  +2 pedal and radial pulses bilaterally.  NEUROLOGIC: Drowsy SKIN: Moist and warm with no rashes appreciated.  Psych:  Not anxious, depressed LN: No inguinal LN enlargement    Antibiotics   Anti-infectives (From admission, onward)   Start     Dose/Rate Route Frequency Ordered Stop   04/24/19 1400  cephALEXin (KEFLEX) capsule 500 mg     500 mg Oral Every 8 hours 04/24/19 0947 04/26/19 1359   04/21/19 1600  cefTRIAXone (ROCEPHIN) 1 g in sodium chloride 0.9 % 100 mL IVPB  Status:  Discontinued     1 g 200 mL/hr over 30 Minutes Intravenous Every 24 hours 04/21/19 1454 04/24/19 0947      Medications  Scheduled Meds: . apixaban  5 mg Oral BID  . atorvastatin  20 mg Oral q1800  . carvedilol  3.125 mg Oral BID WC  . cephALEXin  500 mg Oral Q8H  . docusate sodium  100 mg Oral BID  . dorzolamide-timolol  1 drop Both Eyes BID  . feeding supplement (ENSURE ENLIVE)  237 mL Oral BID BM  . furosemide  40 mg Oral Daily  . latanoprost  1 drop Both Eyes QHS  . levothyroxine  88 mcg Oral Q0600  . multivitamin with minerals  1 tablet Oral Daily  . patiromer  8.4 g Oral Daily  . polyethylene glycol  17 g Oral Daily  . sodium chloride flush  3 mL Intravenous Q12H   Continuous Infusions: . sodium chloride 250 mL (04/23/19 1754)   PRN Meds:.sodium chloride, acetaminophen, albuterol, ondansetron (ZOFRAN) IV, sodium chloride flush   Data Review:   Micro Results Recent Results (from the past 240 hour(s))  Novel Coronavirus, NAA (hospital order; send-out to ref lab)     Status: None   Collection Time: 04/17/19 10:49 AM   Specimen: Nasopharyngeal Swab; Respiratory  Result Value Ref Range Status   SARS-CoV-2, NAA NOT DETECTED NOT DETECTED Final    Comment: (NOTE) This nucleic acid amplification test was developed and its perfomance characteristics determined by [employer]. Nucleic acid amplification tests include PCR and TMA. This test has not been FDA cleared or approved. This test has been authorized by FDA under an Emergency Use Authorization (EUA). This test is only authorized for the duration  of time the declaration that circumstances exist justifying the authorization of the emergency use of in vitro diagnostic tests for detection of SARS-CoV-2 virus and/or diagnosis of COVID-19 infection under section 564(b)(1) of the Act, 21 U.S.C. [id number]) (1), unless the authorization is terminated or revoked sooner. When diagnostic testing is negative, the possibility of a false negative result should be considered in the context of a patient's recent exposures and the presence of clinical signs and symptoms consistent with COVID-19. An individual without symptoms of COVID- 19 and who is not shedding SARS-CoV-2 vir [place] would expect to have a negative (not detected) result in this assay. Performed At: [facility] [address] [place], [place] [id number] Rush Farmer MD [id number]    [place]  Final    Comment: Performed at [facility], [address] [place], [place] [address]  Urine Culture     Status: Abnormal   Collection Time: 04/21/19  1:20 PM   Specimen: Urine, Random  Result Value Ref Range Status   Specimen Description   Final    URINE, RANDOM Performed at [facility], [address] [place], [place] [address]    Special Requests   Final    NONE Performed at [facility], [place], [place] [address]    Culture (A)  Final    >=100,000 COLONIES/mL ESCHERICHIA COLI 50,000 COLONIES/mL ENTEROCOCCUS FAECALIS    Report Status 04/25/2019 FINAL  Final   Organism ID, Bacteria ESCHERICHIA COLI (A)  Final   Organism ID, Bacteria ENTEROCOCCUS FAECALIS (A)  Final      Susceptibility   Escherichia coli - MIC*    AMPICILLIN <=2 SENSITIVE Sensitive     CEFAZOLIN <=4 SENSITIVE Sensitive     CEFTRIAXONE <=1 SENSITIVE Sensitive     CIPROFLOXACIN <=0.25 SENSITIVE Sensitive     GENTAMICIN <=1 SENSITIVE Sensitive     IMIPENEM <=0.25 SENSITIVE Sensitive  NITROFURANTOIN <=16 SENSITIVE Sensitive      TRIMETH/SULFA <=20 SENSITIVE Sensitive     AMPICILLIN/SULBACTAM <=2 SENSITIVE Sensitive     PIP/TAZO <=4 SENSITIVE Sensitive     Extended ESBL NEGATIVE Sensitive     * >=100,000 COLONIES/mL ESCHERICHIA COLI   Enterococcus faecalis - MIC*    AMPICILLIN <=2 SENSITIVE Sensitive     LEVOFLOXACIN 2 SENSITIVE Sensitive     NITROFURANTOIN <=16 SENSITIVE Sensitive     VANCOMYCIN 2 SENSITIVE Sensitive     * 50,000 COLONIES/mL ENTEROCOCCUS FAECALIS    Radiology Reports Ct Head Wo Contrast  Result Date: 04/20/2019 CLINICAL DATA:  Altered mental status. EXAM: CT HEAD WITHOUT CONTRAST TECHNIQUE: Contiguous axial images were obtained from the base of the skull through the vertex without intravenous contrast. COMPARISON:  None FINDINGS: Brain: Hypoattenuation in the left frontal operculum and involving the anterior insular ribbon is likely remote with some evidence for volume loss occluding ex vacuo dilation of the left lateral ventricle. Moderate diffuse atrophy and white matter disease is present otherwise. Basal ganglia are intact. The remainder of the left insular ribbon and the entire right insular ribbon are intact. The brainstem and cerebellum are within normal limits. No significant extraaxial fluid collection is present. Vascular: Diffuse vascular calcifications are present. There are calcifications in the distal M1 segments bilaterally. Dense calcifications are present in the cavernous internal carotid arteries and at the dural margin of both vertebral arteries. Skull: Calvarium is intact. No focal lytic or blastic lesions are present. Sinuses/Orbits: The paranasal sinuses and mastoid air cells are clear. Bilateral lens replacements are noted. Globes and orbits are otherwise unremarkable. IMPRESSION: 1. 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 2. Atrophy and white matter  disease without other focal infarct. 3. Atherosclerosis Electronically Signed   By: San Morelle M.D.   On: 04/20/2019 18:15   US Carotid Bilateral  Result Date: 04/22/2019 CLINICAL DATA:  Dizziness. History of CAD, hypertension, hyperlipidemia and smoking. EXAM: BILATERAL CAROTID DUPLEX ULTRASOUND TECHNIQUE: Pearline Cables scale imaging, color Doppler and duplex ultrasound were performed of bilateral carotid and vertebral arteries in the neck. COMPARISON:  None. FINDINGS: Criteria: Quantification of carotid stenosis is based on velocity parameters that correlate the residual internal carotid diameter with NASCET-based stenosis levels, using the diameter of the distal internal carotid lumen as the denominator for stenosis measurement. The following velocity measurements were obtained: RIGHT ICA: 86/16 cm/sec CCA: [id number] cm/sec SYSTOLIC ICA/CCA RATIO:  1.8 ECA: 24 cm/sec LEFT ICA: 79/18 cm/sec CCA: [id number] cm/sec SYSTOLIC ICA/CCA RATIO:  2.2 ECA: 84 cm/sec RIGHT CAROTID ARTERY: There is a minimal amount of intimal thickening/atherosclerotic plaque seen throughout the right common carotid artery (images 3, 7 and 11). There is a moderate amount of eccentric echogenic partially shadowing plaque within the right carotid bulb (image 15), extending to involve the origin and proximal aspects of the right internal carotid artery (image 23), not resulting in elevated peak systolic velocities within the interrogated course of the right internal carotid artery to suggest a hemodynamically significant stenosis. RIGHT VERTEBRAL ARTERY:  Antegrade flow LEFT CAROTID ARTERY: There is a minimal amount of intimal thickening seen throughout the left common carotid artery (representative image 44). There is a moderate amount of eccentric echogenic partially shadowing plaque within the left carotid bulb (images 47 and 48), extending to involve the origin and proximal aspects of the left internal carotid artery (image 55), not  resulting in  elevated peak systolic velocities within the interrogated course of the left internal carotid artery to suggest a hemodynamically significant stenosis. LEFT VERTEBRAL ARTERY:  Antegrade flow Note is made of a cardiac arrhythmia (representative image 50). IMPRESSION: 1. Moderate amount of bilateral atherosclerotic plaque, right subjectively greater than left, not resulting in a hemodynamically significant stenosis within either internal artery. 2. Incidental made of a cardiac arrhythmia. Further evaluation with ECG monitoring could be performed as indicated Electronically Signed   By: Sandi Mariscal M.D.   On: 04/22/2019 08:25   Dg Chest Port 1 View  Result Date: 04/25/2019 CLINICAL DATA:  Initial evaluation for acute CHF. EXAM: PORTABLE CHEST 1 VIEW COMPARISON:  Prior radiograph from 04/20/2019. FINDINGS: Moderate cardiomegaly, stable. Mediastinal silhouette within normal limits. Lung volumes are reduced. Veiling opacities overlying the bilateral hemidiaphragms most consistent with pleural effusions. Perihilar vascular congestion with mild interstitial prominence without frank pulmonary edema. Superimposed bibasilar opacities likely reflect atelectasis and/or effusions. Infiltrates would be difficult to exclude, and could be considered in the correct clinical setting. No pneumothorax. No acute osseous finding. Degenerative changes noted about the shoulders bilaterally, right greater than left. IMPRESSION: 1. Veiling opacities overlying the bilateral hemidiaphragms, most consistent with pleural effusions. Superimposed bibasilar opacities likely reflect atelectasis and/or effusions. Infiltrates would be difficult to exclude, and could be considered in the correct clinical setting. 2. Cardiomegaly with perihilar vascular congestion and interstitial prominence without frank pulmonary edema. Electronically Signed   By: Jeannine Boga M.D.   On: 04/25/2019 00:59   Dg Chest Port 1 View  Result Date:  04/20/2019 CLINICAL DATA:  Cough. EXAM: PORTABLE CHEST 1 VIEW COMPARISON:  Radiograph April 11, 2019. FINDINGS: Stable cardiomegaly. No pneumothorax is noted. Atherosclerosis of thoracic aorta is noted. Mild bibasilar atelectasis or infiltrates are noted with small pleural effusions. Bony thorax is unremarkable. IMPRESSION: Mild bibasilar subsegmental atelectasis or infiltrates are noted with associated small pleural effusions. Aortic Atherosclerosis (ICD10-I70.0). Electronically Signed   By: Marijo Conception M.D.   On: 04/20/2019 16:41   Dg Chest Port 1 View  Result Date: 04/11/2019 CLINICAL DATA:  Leg and ankle swelling, low blood pressure EXAM: PORTABLE CHEST 1 VIEW COMPARISON:  10/13/2018 FINDINGS: Gross cardiomegaly. Subtle heterogeneous opacity of the right lung base with a probable small layering pleural effusion. The visualized skeletal structures are unremarkable. IMPRESSION: Gross cardiomegaly. Subtle heterogeneous opacity of the right lung base with a probable small layering pleural effusion, concerning for infection or aspiration. Electronically Signed   By: Eddie Candle M.D.   On: 04/11/2019 16:59     CBC Recent Labs  Lab 04/20/19 0834 04/21/19 0538 04/22/19 0713 04/24/19 0628  WBC 4.6 4.0 4.2 4.7  HGB 12.6* 12.3* 12.5* 13.3  HCT 37.7* 36.6* 36.9* 39.5  PLT 148* 155 170 188  MCV 105.6* 105.8* 106.3* 105.3*  MCH 35.3* 35.5* 36.0* 35.5*  MCHC 33.4 33.6 33.9 33.7  RDW 13.0 13.1 12.9 12.6    Chemistries  Recent Labs  Lab 04/19/19 0456 04/22/19 0713 04/24/19 0628 04/24/19 1304 04/24/19 1827 04/25/19 0705  NA  --  133* 131*  --  129* 131*  K 4.7 4.8 6.0* 6.0* 6.0* 5.9*  CL  --  97* 95*  --  94* 95*  CO2  --  29 25  --  22 22  GLUCOSE  --  99 132*  --  130* 108*  BUN  --  27* 46*  --  52* 57*  CREATININE 0.89 0.87 1.22  --  1.44* 1.58*  CALCIUM  --  8.7* 8.8*  --  9.1 9.2    ------------------------------------------------------------------------------------------------------------------ estimated creatinine clearance is 31.5 mL/min (A) (by C-G formula based on SCr of 1.58 mg/dL (H)). ------------------------------------------------------------------------------------------------------------------ No results for input(s): HGBA1C in the last 72 hours. ------------------------------------------------------------------------------------------------------------------ Recent Labs    04/22/19 1447  CHOL 101  HDL 40*  LDLCALC 51  TRIG 49  CHOLHDL 2.5   ------------------------------------------------------------------------------------------------------------------ No results for input(s): TSH, T4TOTAL, T3FREE, THYROIDAB in the last 72 hours.  Invalid input(s): FREET3 ------------------------------------------------------------------------------------------------------------------ No results for input(s): VITAMINB12, FOLATE, FERRITIN, TIBC, IRON, RETICCTPCT in the last 72 hours.  Coagulation profile Recent Labs  Lab 04/19/19 0456 04/20/19 0834 04/20/19 1252 04/21/19 0538 04/22/19 0713  INR 1.3* 1.4* 1.4* 1.5* 1.7*    No results for input(s): DDIMER in the last 72 hours.  Cardiac Enzymes No results for input(s): CKMB, TROPONINI, MYOGLOBIN in the last 168 hours.  Invalid input(s): CK ------------------------------------------------------------------------------------------------------------------ Invalid input(s): POCBNP    Assessment & Plan   85 year old male patient with history of chronic atrial fibrillation, COPD, GERD, hyperlipidemia, hypertension, monoclonal gammopathy, hypothyroidism currently under hospitalist service  -New onset systolic CHF Continue diuresis with oral Lasix Continue Coreg  -Severe tricuspid and mitral regurg prognosis very poor discussed with the son and daughter regarding overall poor prognosis  -Hyperkalemia  due to potassium supplements Patient's potassium continues to be high has been seen by nephrology   -Chronic atrial fibrillation Continue therapy with Eliquis   -Chronic CVA noted during this hospitalization  -COPD appears stable Home dose inhalers  -DVT prophylaxis started Eliquis.        Code Status Orders  (From admission, onward)         Start     Ordered   04/11/19 2145  Full code  Continuous     04/11/19 2144        Code Status History    Date Active Date Inactive Code Status Order ID Comments User Context   10/13/2018 2302 10/19/2018 2134 Full Code [id number]  Demetrios Loll, MD Inpatient   Advance Care Planning Activity    Advance Directive Documentation     Most Recent Value  Type of Advance Directive  Healthcare Power of Attorney  Pre-existing out of facility DNR order (yellow form or pink MOST form)  -  "MOST" Form in Place?  -           Consults  cardology  DVT Prophylaxis  eliquis  Lab Results  Component Value Date   PLT 188 04/24/2019     Time Spent in minutes   55min Greater than 50% of time spent in care coordination and counseling patient regarding the condition and plan of care.   Dustin Flock M.D on 04/25/2019 at 1:46 PM  Between 7am to 6pm - Pager - [phone]  After 6pm go to [email or web address] - Proofreader  [employer]   Office  [phone]

## 2019-04-25 NOTE — Progress Notes (Signed)
Patient's breathing today is wheezy and sounds labored, though this also appears transient and doesn't seem to affect the patient's ability to speak in complete sentences. RT contacted earlier and administered a PRN breathing treatment. Patient also assessed. Will not attempt to lay patient down and turn him to re-assess his sacral wound, which is covered by a foam pad, for the Q Wednesday re-documentation of his wound measurements / characteristics. I just don't feel it's clinically warranted / neccessary today, and may put the patient under needless respiratory distress. Will continue to monitor overall condition. Wenda Low [facility]

## 2019-04-25 NOTE — Plan of Care (Signed)
  Problem: Education: Goal: Knowledge of General Education information will improve Description: Including pain rating scale, medication(s)/side effects and non-pharmacologic comfort measures Outcome: Not Progressing Note: Patient very fatigued / drowsy today. Usually only fully awake when being spoken to. Palliative care following. Prognosis is poor, per physician. Patient made a DNR today. Will continue to monitor discharge progression. Gabriel Delacruz [facility]

## 2019-04-25 NOTE — Progress Notes (Signed)
This RN noticed that patient has difficulty breathing while laying down, scrotal edema has seemed to increase from previous night, and crackles can be heard throughout the lungs. MD Jannifer Franklin notified. MD to place orders for CXR.  Update: MD Jannifer Franklin notified of CXR results. MD placed orders for IV lasix 40 mg once. This RN will administer medication and continue to monitor.   Iran Sizer M

## 2019-04-26 LAB — POTASSIUM: Potassium: 5.8 mmol/L — ABNORMAL HIGH (ref 3.5–5.1)

## 2019-04-26 LAB — CREATININE, SERUM
Creatinine, Ser: 1.87 mg/dL — ABNORMAL HIGH (ref 0.61–1.24)
GFR calc Af Amer: 36 mL/min — ABNORMAL LOW (ref >60)
GFR calc non Af Amer: 31 mL/min — ABNORMAL LOW (ref >60)

## 2019-04-26 LAB — GLUCOSE, CAPILLARY: Glucose-Capillary: 91 mg/dL (ref 70–99)

## 2019-04-26 MED ORDER — APIXABAN 2.5 MG PO TABS
2.5000 mg | ORAL_TABLET | Freq: Two times a day (BID) | ORAL | Status: DC
  Administered 2019-04-26 – 2019-04-28 (×4): 2.5 mg via ORAL
  Filled 2019-04-26 (×4): qty 1

## 2019-04-26 MED ORDER — FUROSEMIDE 10 MG/ML IJ SOLN
40.0000 mg | Freq: Two times a day (BID) | INTRAMUSCULAR | Status: DC
  Administered 2019-04-26 – 2019-05-01 (×11): 40 mg via INTRAVENOUS
  Filled 2019-04-26 (×11): qty 4

## 2019-04-26 MED ORDER — ALBUTEROL SULFATE (2.5 MG/3ML) 0.083% IN NEBU
2.5000 mg | INHALATION_SOLUTION | RESPIRATORY_TRACT | Status: DC | PRN
  Administered 2019-04-26: 2.5 mg via RESPIRATORY_TRACT
  Filled 2019-04-26: qty 3

## 2019-04-26 MED ORDER — SODIUM ZIRCONIUM CYCLOSILICATE 10 G PO PACK
10.0000 g | PACK | Freq: Three times a day (TID) | ORAL | Status: AC
  Administered 2019-04-26 – 2019-04-28 (×6): 10 g via ORAL
  Filled 2019-04-26 (×6): qty 1

## 2019-04-26 MED ORDER — FUROSEMIDE 10 MG/ML IJ SOLN
40.0000 mg | Freq: Once | INTRAMUSCULAR | Status: AC
  Administered 2019-04-26: 40 mg via INTRAVENOUS
  Filled 2019-04-26: qty 4

## 2019-04-26 MED ORDER — SODIUM ZIRCONIUM CYCLOSILICATE 10 G PO PACK
10.0000 g | PACK | Freq: Every day | ORAL | Status: DC
  Filled 2019-04-26: qty 1

## 2019-04-26 MED ORDER — BUDESONIDE 0.25 MG/2ML IN SUSP
0.2500 mg | Freq: Two times a day (BID) | RESPIRATORY_TRACT | Status: DC
  Administered 2019-04-26 – 2019-05-04 (×16): 0.25 mg via RESPIRATORY_TRACT
  Filled 2019-04-26 (×16): qty 2

## 2019-04-26 MED ORDER — IPRATROPIUM-ALBUTEROL 0.5-2.5 (3) MG/3ML IN SOLN
3.0000 mL | Freq: Four times a day (QID) | RESPIRATORY_TRACT | Status: DC
  Administered 2019-04-26 – 2019-05-03 (×26): 3 mL via RESPIRATORY_TRACT
  Filled 2019-04-26 (×26): qty 3

## 2019-04-26 NOTE — Plan of Care (Signed)
  Problem: Cardiac: Goal: Ability to achieve and maintain adequate cardiopulmonary perfusion will improve Outcome: Progressing   

## 2019-04-26 NOTE — Progress Notes (Signed)
Patient having episodes of intermittent labored breathing, crackles and rhonchi noted. Oxygen saturations 100% on room air. MD Mansy made aware. Verbal orders given for IV lasix 40 mg once, and duo-nebs q4 hours PRN as needed. This RN will place orders and administer.  Iran Sizer M

## 2019-04-26 NOTE — Progress Notes (Signed)
[facility]  ROUNDING NOTE   Subjective:   Family meeting with palliative care  Son at bedside.   Patient with increased work of breathing. Unable to answer questions.   Objective:  Vital signs in last 24 hours:  Temp:  [96.1 F (35.6 C)-97.7 F (36.5 C)] 97.4 F (36.3 C) (09/10 0757) Pulse Rate:  [67-88] 68 (09/10 0757) Resp:  [19-22] 19 (09/10 0757) BP: (108-121)/(67-81) 108/77 (09/10 0757) SpO2:  [53 %-100 %] 100 % (09/10 0757)  Weight change:  Filed Weights   04/23/19 0338 04/24/19 0509 04/25/19 0416  Weight: 76.1 kg 52.2 kg 80 kg    Intake/Output: No intake/output data recorded.   Intake/Output this shift:  No intake/output data recorded.  Physical Exam: General: Increased work of breathing  Head: Normocephalic, atraumatic. Moist oral mucosal membranes  Eyes: Anicteric, PERRL  Neck: Supple, trachea midline  Lungs:  Bilateral crackles  Heart: Regular rate and rhythm, +murmur  Abdomen:  Soft, nontender,   Extremities: ++ peripheral edema.  Neurologic: Nonfocal, moving all four extremities  Skin: No lesions        Basic Metabolic Panel: Recent Labs  Lab 04/22/19 0713 04/24/19 0628 04/24/19 1304 04/24/19 1827 04/25/19 0705 04/26/19 0429  NA 133* 131*  --  129* 131*  --   K 4.8 6.0* 6.0* 6.0* 5.9* 5.8*  CL 97* 95*  --  94* 95*  --   CO2 29 25  --  22 22  --   GLUCOSE 99 132*  --  130* 108*  --   BUN 27* 46*  --  52* 57*  --   CREATININE 0.87 1.22  --  1.44* 1.58* 1.87*  CALCIUM 8.7* 8.8*  --  9.1 9.2  --     Liver Function Tests: No results for input(s): AST, ALT, ALKPHOS, BILITOT, PROT, ALBUMIN in the last 168 hours. No results for input(s): LIPASE, AMYLASE in the last 168 hours. No results for input(s): AMMONIA in the last 168 hours.  CBC: Recent Labs  Lab 04/20/19 0834 04/21/19 0538 04/22/19 0713 04/24/19 0628  WBC 4.6 4.0 4.2 4.7  HGB 12.6* 12.3* 12.5* 13.3  HCT 37.7* 36.6* 36.9* 39.5  MCV 105.6* 105.8* 106.3* 105.3*   PLT 148* 155 170 188    Cardiac Enzymes: No results for input(s): CKTOTAL, CKMB, CKMBINDEX, TROPONINI in the last 168 hours.  BNP: Invalid input(s): POCBNP  CBG: Recent Labs  Lab 04/26/19 0508  GLUCAP 91    Microbiology: Results for orders placed or performed during the hospital encounter of 04/11/19  SARS CORONAVIRUS 2 (TAT 6-12 HRS) Nasal Swab Aptima Multi Swab     Status: None   Collection Time: 04/11/19  5:48 PM   Specimen: Aptima Multi Swab; Nasal Swab  Result Value Ref Range Status   SARS Coronavirus 2 NEGATIVE NEGATIVE Final    Comment: (NOTE) SARS-CoV-2 target nucleic acids are NOT DETECTED. The SARS-CoV-2 RNA is generally detectable in upper and lower respiratory specimens during the acute phase of infection. Negative results do not preclude SARS-CoV-2 infection, do not rule out co-infections with other pathogens, and should not be used as the sole basis for treatment or other patient management decisions. Negative results must be combined with clinical observations, patient history, and epidemiological information. The expected result is Negative. Fact Sheet for Patients: SugarRoll.be Fact Sheet for Healthcare Providers: https://www.woods-mathews.com/ This test is not yet approved or cleared by the [place] FDA and  has been authorized for detection and/or diagnosis of SARS-CoV-2 by  FDA under an Emergency Use Authorization (EUA). This EUA will remain  in effect (meaning this test can be used) for the duration of the COVID-19 declaration under Section 56 4(b)(1) of the Act, 21 U.S.C. section 360bbb-3(b)(1), unless the authorization is terminated or revoked sooner. Performed at [facility], [place] [address], [place], [place] [address]   Novel Coronavirus, NAA (hospital order; send-out to ref lab)     Status: None   Collection Time: 04/17/19 10:49 AM   Specimen: Nasopharyngeal Swab; Respiratory  Result Value  Ref Range Status   SARS-CoV-2, NAA NOT DETECTED NOT DETECTED Final    Comment: (NOTE) This nucleic acid amplification test was developed and its perfomance characteristics determined by [employer]. Nucleic acid amplification tests include PCR and TMA. This test has not been FDA cleared or approved. This test has been authorized by FDA under an Emergency Use Authorization (EUA). This test is only authorized for the duration of time the declaration that circumstances exist justifying the authorization of the emergency use of in vitro diagnostic tests for detection of SARS-CoV-2 virus and/or diagnosis of COVID-19 infection under section 564(b)(1) of the Act, 21 U.S.C. [id number]) (1), unless the authorization is terminated or revoked sooner. When diagnostic testing is negative, the possibility of a false negative result should be considered in the context of a patient's recent exposures and the presence of clinical signs and symptoms consistent with COVID-19. An individual without symptoms of COVID- 19 and who is not shedding SARS-CoV-2 vir [place] would expect to have a negative (not detected) result in this assay. Performed At: [facility] [address] [place], [place] [id number] Rush Farmer MD [id number]    [place]  Final    Comment: Performed at [facility], [address] [place], [place] [address]  Urine Culture     Status: Abnormal   Collection Time: 04/21/19  1:20 PM   Specimen: Urine, Random  Result Value Ref Range Status   Specimen Description   Final    URINE, RANDOM Performed at [facility], [address] [place], [place] [address]    Special Requests   Final    NONE Performed at [facility], [place], [place] [address]    Culture (A)  Final    >=100,000 COLONIES/mL ESCHERICHIA COLI 50,000 COLONIES/mL ENTEROCOCCUS FAECALIS    Report Status 04/25/2019 FINAL  Final    Organism ID, Bacteria ESCHERICHIA COLI (A)  Final   Organism ID, Bacteria ENTEROCOCCUS FAECALIS (A)  Final      Susceptibility   Escherichia coli - MIC*    AMPICILLIN <=2 SENSITIVE Sensitive     CEFAZOLIN <=4 SENSITIVE Sensitive     CEFTRIAXONE <=1 SENSITIVE Sensitive     CIPROFLOXACIN <=0.25 SENSITIVE Sensitive     GENTAMICIN <=1 SENSITIVE Sensitive     IMIPENEM <=0.25 SENSITIVE Sensitive     NITROFURANTOIN <=16 SENSITIVE Sensitive     TRIMETH/SULFA <=20 SENSITIVE Sensitive     AMPICILLIN/SULBACTAM <=2 SENSITIVE Sensitive     PIP/TAZO <=4 SENSITIVE Sensitive     Extended ESBL NEGATIVE Sensitive     * >=100,000 COLONIES/mL ESCHERICHIA COLI   Enterococcus faecalis - MIC*    AMPICILLIN <=2 SENSITIVE Sensitive     LEVOFLOXACIN 2 SENSITIVE Sensitive     NITROFURANTOIN <=16 SENSITIVE Sensitive     VANCOMYCIN 2 SENSITIVE Sensitive     * 50,000 COLONIES/mL ENTEROCOCCUS FAECALIS    Coagulation Studies: No results for input(s): LABPROT, INR in  the last 72 hours.  Urinalysis: No results for input(s): COLORURINE, LABSPEC, PHURINE, GLUCOSEU, HGBUR, BILIRUBINUR, KETONESUR, PROTEINUR, UROBILINOGEN, NITRITE, LEUKOCYTESUR in the last 72 hours.  Invalid input(s): APPERANCEUR    Imaging: Dg Chest Port 1 View  Result Date: 04/25/2019 CLINICAL DATA:  Initial evaluation for acute CHF. EXAM: PORTABLE CHEST 1 VIEW COMPARISON:  Prior radiograph from 04/20/2019. FINDINGS: Moderate cardiomegaly, stable. Mediastinal silhouette within normal limits. Lung volumes are reduced. Veiling opacities overlying the bilateral hemidiaphragms most consistent with pleural effusions. Perihilar vascular congestion with mild interstitial prominence without frank pulmonary edema. Superimposed bibasilar opacities likely reflect atelectasis and/or effusions. Infiltrates would be difficult to exclude, and could be considered in the correct clinical setting. No pneumothorax. No acute osseous finding. Degenerative changes noted  about the shoulders bilaterally, right greater than left. IMPRESSION: 1. Veiling opacities overlying the bilateral hemidiaphragms, most consistent with pleural effusions. Superimposed bibasilar opacities likely reflect atelectasis and/or effusions. Infiltrates would be difficult to exclude, and could be considered in the correct clinical setting. 2. Cardiomegaly with perihilar vascular congestion and interstitial prominence without frank pulmonary edema. Electronically Signed   By: Jeannine Boga M.D.   On: 04/25/2019 00:59     Medications:   . sodium chloride 250 mL (04/23/19 1754)   . apixaban  2.5 mg Oral BID  . atorvastatin  20 mg Oral q1800  . budesonide (PULMICORT) nebulizer solution  0.25 mg Nebulization BID  . carvedilol  3.125 mg Oral BID WC  . docusate sodium  100 mg Oral BID  . dorzolamide-timolol  1 drop Both Eyes BID  . feeding supplement (ENSURE ENLIVE)  237 mL Oral BID BM  . furosemide  40 mg Intravenous Q12H  . ipratropium-albuterol  3 mL Nebulization Q6H  . latanoprost  1 drop Both Eyes QHS  . levothyroxine  88 mcg Oral Q0600  . multivitamin with minerals  1 tablet Oral Daily  . polyethylene glycol  17 g Oral Daily  . sodium chloride flush  3 mL Intravenous Q12H  . sodium zirconium cyclosilicate  10 g Oral TID   Followed by  . [START ON 04/29/2019] sodium zirconium cyclosilicate  10 g Oral Daily   sodium chloride, acetaminophen, sodium chloride flush  Assessment/ Plan:  [name] is a 85 y.o. black male  with hypertension, MGUS, hypothyroidism, COPD, atrial fibrillation, who was admitted to [facility] on 04/11/2019 for acute exacerbation of systolic congestive heart failure.   1. Acute renal failure: baseline creatinine of 0.87 on 9/6 2. Hyponatremia 3. Hyperkalemia 4. Acute exacerbation of systolic congestive heart failure EF 25-30% 5. Hypertension  - Potassium binders - Resume furosemide IV - Family moving towards comfort care - Not a  candidate for hemodialysis.    LOS: [place] 9/10/202012:11 PM

## 2019-04-26 NOTE — Progress Notes (Signed)
Palliative: Gabriel Delacruz is resting quietly in bed.  He is sleeping comfortably.  Son, Gabriel Delacruz from [place], is at bedside.  As we are talking, Gabriel Delacruz awakens, it seems that he has been dreaming.  He is unable to tell me his strength.  Gabriel Delacruz and I talked about acute and chronic health conditions, we reviewed labs, and the treatment plan.  I again share a diagram of the chronic illness pathway, what is normal and expected.  Gabriel Delacruz asks about continuing to treat the treatable versus comfort care.  I share that after discussions with family related to Gabriel Delacruz current wishes, telling daughter Gabriel Delacruz that he is tired, declining to come to the hospital initially, the medical team could support comfort and dignity, hospice care at this point if family so desires.  Plan: At this point continue to treat the treatable but no CPR, no intubation.  At this point the goal is for Gabriel Delacruz to go to rehab, but, family is considering what comfort care would look like.  4 minutes Quinn Axe, NP Palliative Medicine Team Team Phone # [phone] Greater than 50% of this time was spent counseling and coordinating care related to the above assessment and plan.

## 2019-04-26 NOTE — Consult Note (Signed)
ANTICOAGULATION CONSULT NOTE - Initial Consult  Pharmacy Consult for apixaban Indication: atrial fibrillation  No Known Allergies  Patient Measurements: Height: 5\' 6"  (167.6 cm) Weight: 176 lb 6.4 oz (80 kg) IBW/kg (Calculated) : 63.8  Vital Signs: Temp: 97.4 F (36.3 C) (09/10 0757) Temp Source: Oral (09/10 0757) BP: 108/77 (09/10 0757) Pulse Rate: 68 (09/10 0757)  Labs: Recent Labs    04/24/19 0628 04/24/19 1827 04/25/19 0705 04/26/19 0429  HGB 13.3  --   --   --   HCT 39.5  --   --   --   PLT 188  --   --   --   CREATININE 1.22 1.44* 1.58* 1.87*    Estimated Creatinine Clearance: 26.6 mL/min (A) (by C-G formula based on SCr of 1.87 mg/dL (H)).   Medical History: Past Medical History:  Diagnosis Date  . Anemia   . Atrial fibrillation ([place])   . COPD (chronic obstructive pulmonary disease) ([place])   . GERD (gastroesophageal reflux disease)   . Glaucoma   . Hyperlipemia   . Hypertension   . Hypothyroidism   . Irregular heart rhythm   . Left anterior fascicular block   . MGUS (monoclonal gammopathy of unknown significance)   . RBBB     Medications:  Medications Prior to Admission  Medication Sig Dispense Refill Last Dose  . albuterol (PROAIR HFA) 108 (90 BASE) MCG/ACT inhaler Inhale 2 puffs into the lungs every 6 (six) hours as needed.    prn at prn  . dorzolamide-timolol (COSOPT) 22.3-6.8 MG/ML ophthalmic solution Place 1 drop into both eyes 2 (two) times daily.    04/11/2019 at Unknown time  . Fluticasone-Salmeterol (ADVAIR DISKUS) 250-50 MCG/DOSE AEPB Inhale 1 puff into the lungs daily.    04/11/2019 at Unknown time  . furosemide (LASIX) 40 MG tablet Take 1 tablet (40 mg total) by mouth daily. 30 tablet 0 04/11/2019 at 1100  . levothyroxine (SYNTHROID) 75 MCG tablet Take 75 mcg by mouth daily before breakfast.   04/11/2019 at Unknown time  . multivitamin-iron-minerals-folic acid (CENTRUM) chewable tablet Chew 1 tablet by mouth daily.    04/11/2019 at 1100  .  potassium chloride (K-DUR) 10 MEQ tablet Take 10 mEq by mouth daily.    04/11/2019 at 1100  . Travoprost, BAK Free, (TRAVATAN Z) 0.004 % SOLN ophthalmic solution PLACE 1 DROP INTO BOTH EYES ONCE DAILY   04/10/2019 at Unknown time  . warfarin (COUMADIN) 6 MG tablet Take 6.5 mg by mouth daily at 6 PM.    04/10/2019 at 1800   Scheduled:  . apixaban  2.5 mg Oral BID  . atorvastatin  20 mg Oral q1800  . budesonide (PULMICORT) nebulizer solution  0.25 mg Nebulization BID  . carvedilol  3.125 mg Oral BID WC  . docusate sodium  100 mg Oral BID  . dorzolamide-timolol  1 drop Both Eyes BID  . feeding supplement (ENSURE ENLIVE)  237 mL Oral BID BM  . furosemide  40 mg Intravenous Q12H  . ipratropium-albuterol  3 mL Nebulization Q6H  . latanoprost  1 drop Both Eyes QHS  . levothyroxine  88 mcg Oral Q0600  . multivitamin with minerals  1 tablet Oral Daily  . polyethylene glycol  17 g Oral Daily  . sodium chloride flush  3 mL Intravenous Q12H  . sodium zirconium cyclosilicate  10 g Oral TID   Followed by  . [START ON 04/29/2019] sodium zirconium cyclosilicate  10 g Oral Daily   Infusions:  .  sodium chloride 250 mL (04/23/19 1754)   PRN: sodium chloride, acetaminophen, sodium chloride flush Anti-infectives (From admission, onward)   Start     Dose/Rate Route Frequency Ordered Stop   04/24/19 1400  cephALEXin (KEFLEX) capsule 500 mg  Status:  Discontinued     500 mg Oral Every 8 hours 04/24/19 0947 04/25/19 1423   04/21/19 1600  cefTRIAXone (ROCEPHIN) 1 g in sodium chloride 0.9 % 100 mL IVPB  Status:  Discontinued     1 g 200 mL/hr over 30 Minutes Intravenous Every 24 hours 04/21/19 1454 04/24/19 0947      Assessment: Pharmacy consulted to dose apixaban for afib. Pt has been on apixaban 5 mg BID since 04/21/2019. Scr in trending up, and a dose reduction is currently recommended.   Goal of Therapy:  Monitor platelets by anticoagulation protocol: Yes   Plan:  Will reduce apixaban dose to 2.5 mg  BID from 5 mg BID, because pt meets criteria for dose reductions (Pt > 80 yr and Scr > 1.5). If Scr improves < 1.5, will need to increase apixaban dose back to 5 mg BID.   Oswald Hillock, PharmD, BCPS 04/26/2019,11:29 AM

## 2019-04-26 NOTE — TOC Progression Note (Signed)
Transition of Care [facility]) - Progression Note    Patient Details  Name: Knightly Markell MRN: [id number] Date of Birth: Apr 17, 1928  Transition of Care [facility]) CM/SW Contact  Ross Ludwig,  Phone Number: 04/26/2019, 4:19 PM  Clinical Narrative:     CSW received phone call from patient's son [name] to see if patient's other son [name] is able to be approved to visit patient tomorrow since patient has had a decline.  CSW spoke to [facility] and she said to notify the [facility] tomorrow once a time is known for patient's son to arrive.  CSW updated patient's son [name] who is at the bedside and also updated patient's daughter Shirlean Mylar.  CSW was informed that patient's family are considering possibly comfort care, CSW will notify palliative care.  CSW continuing to follow patient's progress throughout discharge planning.  Expected Discharge Plan: [place] Barriers to Discharge: Continued Medical Work up  Expected Discharge Plan and Services Expected Discharge Plan: [place] In-house Referral: Clinical Social Work     Living arrangements for the past 2 months: Single Family Home                                       Social Determinants of Health (SDOH) Interventions    Readmission Risk Interventions Readmission Risk Prevention Plan 04/12/2019  Medication Screening Complete  Transportation Screening Complete  Some recent data might be hidden

## 2019-04-26 NOTE — Progress Notes (Signed)
[place] at [facility]                                                                                                                                                                                  Patient Demographics   Gabriel Delacruz, is a 85 y.o. male, DOB - 1927-09-13, PO:[id number]  Admit date - 04/11/2019   Admitting Physician Christel Mormon, MD  Outpatient Primary MD for the patient is Marinda Elk, MD   LOS - 15  Subjective: Patient more awake but having intermittent episodes of shortness of breath    Review of Systems:   CONSTITUTIONAL: Unable to provide Vitals:   Vitals:   04/26/19 0339 04/26/19 0355 04/26/19 0757 04/26/19 1239  BP: 111/72  108/77   Pulse: 68  68   Resp: (!) 22  19   Temp: 97.7 F (36.5 C)  (!) 97.4 F (36.3 C)   TempSrc:   Oral   SpO2: (!) 53% 100% 100% 98%  Weight:      Height:        Wt Readings from Last 3 Encounters:  04/25/19 80 kg  10/19/18 80.3 kg  07/04/18 77.3 kg    No intake or output data in the 24 hours ending 04/26/19 1330  Physical Exam:   GENERAL: Pleasant-appearing in no apparent distress.  HEAD, EYES, EARS, NOSE AND THROAT: Atraumatic, normocephalic. Extraocular muscles are intact. Pupils equal and reactive to light. Sclerae anicteric. No conjunctival injection. No oro-pharyngeal erythema.  NECK: Supple. There is no jugular venous distention. No bruits, no lymphadenopathy, no thyromegaly.  HEART: Regular rate and rhythm,.  Positive murmurs, no rubs, no clicks.  LUNGS: Clear to auscultation bilaterally. No rales or rhonchi. No wheezes.  ABDOMEN: Soft, flat, nontender, nondistended. Has good bowel sounds. No hepatosplenomegaly appreciated.  EXTREMITIES: No evidence of any cyanosis, clubbing, or peripheral edema.  +2 pedal and radial pulses bilaterally.  NEUROLOGIC: Patient more awake SKIN: Moist and warm with no rashes appreciated.  Psych: Not anxious, depressed LN: No inguinal LN  enlargement    Antibiotics   Anti-infectives (From admission, onward)   Start     Dose/Rate Route Frequency Ordered Stop   04/24/19 1400  cephALEXin (KEFLEX) capsule 500 mg  Status:  Discontinued     500 mg Oral Every 8 hours 04/24/19 0947 04/25/19 1423   04/21/19 1600  cefTRIAXone (ROCEPHIN) 1 g in sodium chloride 0.9 % 100 mL IVPB  Status:  Discontinued     1 g 200 mL/hr over 30 Minutes Intravenous Every 24 hours 04/21/19 1454 04/24/19 0947      Medications   Scheduled Meds: . apixaban  2.5 mg  Oral BID  . atorvastatin  20 mg Oral q1800  . budesonide (PULMICORT) nebulizer solution  0.25 mg Nebulization BID  . carvedilol  3.125 mg Oral BID WC  . docusate sodium  100 mg Oral BID  . dorzolamide-timolol  1 drop Both Eyes BID  . feeding supplement (ENSURE ENLIVE)  237 mL Oral BID BM  . furosemide  40 mg Intravenous Q12H  . ipratropium-albuterol  3 mL Nebulization Q6H  . latanoprost  1 drop Both Eyes QHS  . levothyroxine  88 mcg Oral Q0600  . multivitamin with minerals  1 tablet Oral Daily  . polyethylene glycol  17 g Oral Daily  . sodium chloride flush  3 mL Intravenous Q12H  . sodium zirconium cyclosilicate  10 g Oral TID   Followed by  . [START ON 04/29/2019] sodium zirconium cyclosilicate  10 g Oral Daily   Continuous Infusions: . sodium chloride 250 mL (04/23/19 1754)   PRN Meds:.sodium chloride, acetaminophen, sodium chloride flush   Data Review:   Micro Results Recent Results (from the past 240 hour(s))  Novel Coronavirus, NAA (hospital order; send-out to ref lab)     Status: None   Collection Time: 04/17/19 10:49 AM   Specimen: Nasopharyngeal Swab; Respiratory  Result Value Ref Range Status   SARS-CoV-2, NAA NOT DETECTED NOT DETECTED Final    Comment: (NOTE) This nucleic acid amplification test was developed and its perfomance characteristics determined by [employer]. Nucleic acid amplification tests include PCR and TMA. This test has not been  FDA cleared or approved. This test has been authorized by FDA under an Emergency Use Authorization (EUA). This test is only authorized for the duration of time the declaration that circumstances exist justifying the authorization of the emergency use of in vitro diagnostic tests for detection of SARS-CoV-2 virus and/or diagnosis of COVID-19 infection under section 564(b)(1) of the Act, 21 U.S.C. [id number]) (1), unless the authorization is terminated or revoked sooner. When diagnostic testing is negative, the possibility of a false negative result should be considered in the context of a patient's recent exposures and the presence of clinical signs and symptoms consistent with COVID-19. An individual without symptoms of COVID- 19 and who is not shedding SARS-CoV-2 vir [place] would expect to have a negative (not detected) result in this assay. Performed At: [facility] [address] [place], [place] [id number] Rush Farmer MD [id number]    [place]  Final    Comment: Performed at [facility], [address] [place], [place] [address]  Urine Culture     Status: Abnormal   Collection Time: 04/21/19  1:20 PM   Specimen: Urine, Random  Result Value Ref Range Status   Specimen Description   Final    URINE, RANDOM Performed at [facility], [address] [place], [place] [address]    Special Requests   Final    NONE Performed at [facility], [place], [place] [address]    Culture (A)  Final    >=100,000 COLONIES/mL ESCHERICHIA COLI 50,000 COLONIES/mL ENTEROCOCCUS FAECALIS    Report Status 04/25/2019 FINAL  Final   Organism ID, Bacteria ESCHERICHIA COLI (A)  Final   Organism ID, Bacteria ENTEROCOCCUS FAECALIS (A)  Final      Susceptibility   Escherichia coli - MIC*    AMPICILLIN <=2 SENSITIVE Sensitive     CEFAZOLIN <=4 SENSITIVE Sensitive     CEFTRIAXONE <=1 SENSITIVE Sensitive      CIPROFLOXACIN <=0.25 SENSITIVE  Sensitive     GENTAMICIN <=1 SENSITIVE Sensitive     IMIPENEM <=0.25 SENSITIVE Sensitive     NITROFURANTOIN <=16 SENSITIVE Sensitive     TRIMETH/SULFA <=20 SENSITIVE Sensitive     AMPICILLIN/SULBACTAM <=2 SENSITIVE Sensitive     PIP/TAZO <=4 SENSITIVE Sensitive     Extended ESBL NEGATIVE Sensitive     * >=100,000 COLONIES/mL ESCHERICHIA COLI   Enterococcus faecalis - MIC*    AMPICILLIN <=2 SENSITIVE Sensitive     LEVOFLOXACIN 2 SENSITIVE Sensitive     NITROFURANTOIN <=16 SENSITIVE Sensitive     VANCOMYCIN 2 SENSITIVE Sensitive     * 50,000 COLONIES/mL ENTEROCOCCUS FAECALIS    Radiology Reports Ct Head Wo Contrast  Result Date: 04/20/2019 CLINICAL DATA:  Altered mental status. EXAM: CT HEAD WITHOUT CONTRAST TECHNIQUE: Contiguous axial images were obtained from the base of the skull through the vertex without intravenous contrast. COMPARISON:  None FINDINGS: Brain: Hypoattenuation in the left frontal operculum and involving the anterior insular ribbon is likely remote with some evidence for volume loss occluding ex vacuo dilation of the left lateral ventricle. Moderate diffuse atrophy and white matter disease is present otherwise. Basal ganglia are intact. The remainder of the left insular ribbon and the entire right insular ribbon are intact. The brainstem and cerebellum are within normal limits. No significant extraaxial fluid collection is present. Vascular: Diffuse vascular calcifications are present. There are calcifications in the distal M1 segments bilaterally. Dense calcifications are present in the cavernous internal carotid arteries and at the dural margin of both vertebral arteries. Skull: Calvarium is intact. No focal lytic or blastic lesions are present. Sinuses/Orbits: The paranasal sinuses and mastoid air cells are clear. Bilateral lens replacements are noted. Globes and orbits are otherwise unremarkable. IMPRESSION: 1. Age indeterminate infarct  involving the left frontal operculum and anterior insular ribbon is likely remote or at least early chronic without evidence of volume loss. 2. Atrophy and white matter disease without other focal infarct. 3. Atherosclerosis Electronically Signed   By: San Morelle M.D.   On: 04/20/2019 18:15   US Carotid Bilateral  Result Date: 04/22/2019 CLINICAL DATA:  Dizziness. History of CAD, hypertension, hyperlipidemia and smoking. EXAM: BILATERAL CAROTID DUPLEX ULTRASOUND TECHNIQUE: Pearline Cables scale imaging, color Doppler and duplex ultrasound were performed of bilateral carotid and vertebral arteries in the neck. COMPARISON:  None. FINDINGS: Criteria: Quantification of carotid stenosis is based on velocity parameters that correlate the residual internal carotid diameter with NASCET-based stenosis levels, using the diameter of the distal internal carotid lumen as the denominator for stenosis measurement. The following velocity measurements were obtained: RIGHT ICA: 86/16 cm/sec CCA: [id number] cm/sec SYSTOLIC ICA/CCA RATIO:  1.8 ECA: 24 cm/sec LEFT ICA: 79/18 cm/sec CCA: [id number] cm/sec SYSTOLIC ICA/CCA RATIO:  2.2 ECA: 84 cm/sec RIGHT CAROTID ARTERY: There is a minimal amount of intimal thickening/atherosclerotic plaque seen throughout the right common carotid artery (images 3, 7 and 11). There is a moderate amount of eccentric echogenic partially shadowing plaque within the right carotid bulb (image 15), extending to involve the origin and proximal aspects of the right internal carotid artery (image 23), not resulting in elevated peak systolic velocities within the interrogated course of the right internal carotid artery to suggest a hemodynamically significant stenosis. RIGHT VERTEBRAL ARTERY:  Antegrade flow LEFT CAROTID ARTERY: There is a minimal amount of intimal thickening seen throughout the left common carotid artery (representative image 44). There is a moderate amount of eccentric echogenic partially shadowing plaque  within the left carotid bulb (  images 85 and 89), extending to involve the origin and proximal aspects of the left internal carotid artery (image 55), not resulting in elevated peak systolic velocities within the interrogated course of the left internal carotid artery to suggest a hemodynamically significant stenosis. LEFT VERTEBRAL ARTERY:  Antegrade flow Note is made of a cardiac arrhythmia (representative image 50). IMPRESSION: 1. Moderate amount of bilateral atherosclerotic plaque, right subjectively greater than left, not resulting in a hemodynamically significant stenosis within either internal artery. 2. Incidental made of a cardiac arrhythmia. Further evaluation with ECG monitoring could be performed as indicated Electronically Signed   By: Sandi Mariscal M.D.   On: 04/22/2019 08:25   Dg Chest Port 1 View  Result Date: 04/25/2019 CLINICAL DATA:  Initial evaluation for acute CHF. EXAM: PORTABLE CHEST 1 VIEW COMPARISON:  Prior radiograph from 04/20/2019. FINDINGS: Moderate cardiomegaly, stable. Mediastinal silhouette within normal limits. Lung volumes are reduced. Veiling opacities overlying the bilateral hemidiaphragms most consistent with pleural effusions. Perihilar vascular congestion with mild interstitial prominence without frank pulmonary edema. Superimposed bibasilar opacities likely reflect atelectasis and/or effusions. Infiltrates would be difficult to exclude, and could be considered in the correct clinical setting. No pneumothorax. No acute osseous finding. Degenerative changes noted about the shoulders bilaterally, right greater than left. IMPRESSION: 1. Veiling opacities overlying the bilateral hemidiaphragms, most consistent with pleural effusions. Superimposed bibasilar opacities likely reflect atelectasis and/or effusions. Infiltrates would be difficult to exclude, and could be considered in the correct clinical setting. 2. Cardiomegaly with perihilar vascular congestion and interstitial  prominence without frank pulmonary edema. Electronically Signed   By: Jeannine Boga M.D.   On: 04/25/2019 00:59   Dg Chest Port 1 View  Result Date: 04/20/2019 CLINICAL DATA:  Cough. EXAM: PORTABLE CHEST 1 VIEW COMPARISON:  Radiograph April 11, 2019. FINDINGS: Stable cardiomegaly. No pneumothorax is noted. Atherosclerosis of thoracic aorta is noted. Mild bibasilar atelectasis or infiltrates are noted with small pleural effusions. Bony thorax is unremarkable. IMPRESSION: Mild bibasilar subsegmental atelectasis or infiltrates are noted with associated small pleural effusions. Aortic Atherosclerosis (ICD10-I70.0). Electronically Signed   By: Marijo Conception M.D.   On: 04/20/2019 16:41   Dg Chest Port 1 View  Result Date: 04/11/2019 CLINICAL DATA:  Leg and ankle swelling, low blood pressure EXAM: PORTABLE CHEST 1 VIEW COMPARISON:  10/13/2018 FINDINGS: Gross cardiomegaly. Subtle heterogeneous opacity of the right lung base with a probable small layering pleural effusion. The visualized skeletal structures are unremarkable. IMPRESSION: Gross cardiomegaly. Subtle heterogeneous opacity of the right lung base with a probable small layering pleural effusion, concerning for infection or aspiration. Electronically Signed   By: Eddie Candle M.D.   On: 04/11/2019 16:59     CBC Recent Labs  Lab 04/20/19 0834 04/21/19 0538 04/22/19 0713 04/24/19 0628  WBC 4.6 4.0 4.2 4.7  HGB 12.6* 12.3* 12.5* 13.3  HCT 37.7* 36.6* 36.9* 39.5  PLT 148* 155 170 188  MCV 105.6* 105.8* 106.3* 105.3*  MCH 35.3* 35.5* 36.0* 35.5*  MCHC 33.4 33.6 33.9 33.7  RDW 13.0 13.1 12.9 12.6    Chemistries  Recent Labs  Lab 04/22/19 0713 04/24/19 0628 04/24/19 1304 04/24/19 1827 04/25/19 0705 04/26/19 0429  NA 133* 131*  --  129* 131*  --   K 4.8 6.0* 6.0* 6.0* 5.9* 5.8*  CL 97* 95*  --  94* 95*  --   CO2 29 25  --  22 22  --   GLUCOSE 99 132*  --  130* 108*  --  BUN 27* 46*  --  52* 57*  --   CREATININE 0.87 1.22   --  1.44* 1.58* 1.87*  CALCIUM 8.7* 8.8*  --  9.1 9.2  --    ------------------------------------------------------------------------------------------------------------------ estimated creatinine clearance is 26.6 mL/min (A) (by C-G formula based on SCr of 1.87 mg/dL (H)). ------------------------------------------------------------------------------------------------------------------ No results for input(s): HGBA1C in the last 72 hours. ------------------------------------------------------------------------------------------------------------------ No results for input(s): CHOL, HDL, LDLCALC, TRIG, CHOLHDL, LDLDIRECT in the last 72 hours. ------------------------------------------------------------------------------------------------------------------ No results for input(s): TSH, T4TOTAL, T3FREE, THYROIDAB in the last 72 hours.  Invalid input(s): FREET3 ------------------------------------------------------------------------------------------------------------------ No results for input(s): VITAMINB12, FOLATE, FERRITIN, TIBC, IRON, RETICCTPCT in the last 72 hours.  Coagulation profile Recent Labs  Lab 04/20/19 0834 04/20/19 1252 04/21/19 0538 04/22/19 0713  INR 1.4* 1.4* 1.5* 1.7*    No results for input(s): DDIMER in the last 72 hours.  Cardiac Enzymes No results for input(s): CKMB, TROPONINI, MYOGLOBIN in the last 168 hours.  Invalid input(s): CK ------------------------------------------------------------------------------------------------------------------ Invalid input(s): POCBNP    Assessment & Plan   85 year old male patient with history of chronic atrial fibrillation, COPD, GERD, hyperlipidemia, hypertension, monoclonal gammopathy, hypothyroidism currently under hospitalist service  -New onset systolic CHF Continue diuresis with Lasix nephrology is trying IV Lasix Continue Coreg  -Severe tricuspid and mitral regurg prognosis very poor  Patient having  intermittent episodes of shortness of breath  -Hyperkalemia due to potassium supplements Since will be switched to low kalema   -Chronic atrial fibrillation Continue therapy with Eliquis   -Chronic CVA noted during this hospitalization  -COPD appears stable Home dose inhalers  -DVT prophylaxis started Eliquis.        Code Status Orders  (From admission, onward)         Start     Ordered   04/11/19 2145  Full code  Continuous     04/11/19 2144        Code Status History    Date Active Date Inactive Code Status Order ID Comments User Context   10/13/2018 2302 10/19/2018 2134 Full Code [id number]  Demetrios Loll, MD Inpatient   Advance Care Planning Activity    Advance Directive Documentation     Most Recent Value  Type of Advance Directive  Healthcare Power of Attorney  Pre-existing out of facility DNR order (yellow form or pink MOST form)  -  "MOST" Form in Place?  -           Consults  cardology  DVT Prophylaxis  eliquis  Lab Results  Component Value Date   PLT 188 04/24/2019     Time Spent in minutes   24min Greater than 50% of time spent in care coordination and counseling patient regarding the condition and plan of care.   Dustin Flock M.D on 04/26/2019 at 1:30 PM  Between 7am to 6pm - Pager - [phone]  After 6pm go to [email or web address] - Proofreader  [employer]   Office  [phone]

## 2019-04-27 DIAGNOSIS — R05 Cough: Secondary | ICD-10-CM

## 2019-04-27 DIAGNOSIS — R059 Cough, unspecified: Secondary | ICD-10-CM

## 2019-04-27 LAB — RENAL FUNCTION PANEL
Albumin: 3.6 g/dL (ref 3.5–5.0)
Anion gap: 14 (ref 5–15)
BUN: 80 mg/dL — ABNORMAL HIGH (ref 8–23)
CO2: 23 mmol/L (ref 22–32)
Calcium: 9 mg/dL (ref 8.9–10.3)
Chloride: 96 mmol/L — ABNORMAL LOW (ref 98–111)
Creatinine, Ser: 1.68 mg/dL — ABNORMAL HIGH (ref 0.61–1.24)
GFR calc Af Amer: 41 mL/min — ABNORMAL LOW (ref >60)
GFR calc non Af Amer: 35 mL/min — ABNORMAL LOW (ref >60)
Glucose, Bld: 83 mg/dL (ref 70–99)
Phosphorus: 5.6 mg/dL — ABNORMAL HIGH (ref 2.5–4.6)
Potassium: 5 mmol/L (ref 3.5–5.1)
Sodium: 133 mmol/L — ABNORMAL LOW (ref 135–145)

## 2019-04-27 NOTE — Progress Notes (Addendum)
Report received and care assumed. Pt resting in bed with eyes closed and family at bedside. Call bell in reach. NADN No c/o needs met. Will continue to monitor.   1855 Pt resting in bed with call bell in reach. NADN Needs met.

## 2019-04-27 NOTE — Progress Notes (Signed)
[facility]  ROUNDING NOTE   Subjective:   Two sons at bedside.  Patient is breathing better  K 5 (5.8) Creatinine 1.68 (1.87)  Objective:  Vital signs in last 24 hours:  Temp:  [96.5 F (35.8 C)-97.6 F (36.4 C)] 97.6 F (36.4 C) (09/11 0516) Pulse Rate:  [69-71] 69 (09/11 0516) Resp:  [15-20] 20 (09/11 0516) BP: (102-110)/(66-69) 108/67 (09/11 0516) SpO2:  [97 %-100 %] 97 % (09/11 0738) Weight:  [80.3 kg] 80.3 kg (09/11 0516)  Weight change:  Filed Weights   04/24/19 0509 04/25/19 0416 04/27/19 0516  Weight: 52.2 kg 80 kg 80.3 kg    Intake/Output: I/O last 3 completed shifts: In: -  Out: 450 [Urine:450]   Intake/Output this shift:  No intake/output data recorded.  Physical Exam: General: NAD, laying in bed  Head: Normocephalic, atraumatic. Moist oral mucosal membranes  Eyes: Anicteric, PERRL  Neck: Supple, trachea midline  Lungs:  Bilateral crackles  Heart: Regular rate and rhythm, +murmur  Abdomen:  Soft, nontender,   Extremities: ++ peripheral edema.  Neurologic: Nonfocal, moving all four extremities  Skin: No lesions        Basic Metabolic Panel: Recent Labs  Lab 04/22/19 0713 04/24/19 0628 04/24/19 1304 04/24/19 1827 04/25/19 0705 04/26/19 0429 04/27/19 0623  NA 133* 131*  --  129* 131*  --  133*  K 4.8 6.0* 6.0* 6.0* 5.9* 5.8* 5.0  CL 97* 95*  --  94* 95*  --  96*  CO2 29 25  --  22 22  --  23  GLUCOSE 99 132*  --  130* 108*  --  83  BUN 27* 46*  --  52* 57*  --  80*  CREATININE 0.87 1.22  --  1.44* 1.58* 1.87* 1.68*  CALCIUM 8.7* 8.8*  --  9.1 9.2  --  9.0  PHOS  --   --   --   --   --   --  5.6*    Liver Function Tests: Recent Labs  Lab 04/27/19 0623  ALBUMIN 3.6   No results for input(s): LIPASE, AMYLASE in the last 168 hours. No results for input(s): AMMONIA in the last 168 hours.  CBC: Recent Labs  Lab 04/21/19 0538 04/22/19 0713 04/24/19 0628  WBC 4.0 4.2 4.7  HGB 12.3* 12.5* 13.3  HCT 36.6* 36.9* 39.5   MCV 105.8* 106.3* 105.3*  PLT 155 170 188    Cardiac Enzymes: No results for input(s): CKTOTAL, CKMB, CKMBINDEX, TROPONINI in the last 168 hours.  BNP: Invalid input(s): POCBNP  CBG: Recent Labs  Lab 04/26/19 0508  GLUCAP 91    Microbiology: Results for orders placed or performed during the hospital encounter of 04/11/19  SARS CORONAVIRUS 2 (TAT 6-12 HRS) Nasal Swab Aptima Multi Swab     Status: None   Collection Time: 04/11/19  5:48 PM   Specimen: Aptima Multi Swab; Nasal Swab  Result Value Ref Range Status   SARS Coronavirus 2 NEGATIVE NEGATIVE Final    Comment: (NOTE) SARS-CoV-2 target nucleic acids are NOT DETECTED. The SARS-CoV-2 RNA is generally detectable in upper and lower respiratory specimens during the acute phase of infection. Negative results do not preclude SARS-CoV-2 infection, do not rule out co-infections with other pathogens, and should not be used as the sole basis for treatment or other patient management decisions. Negative results must be combined with clinical observations, patient history, and epidemiological information. The expected result is Negative. Fact Sheet for Patients: SugarRoll.be Fact Sheet  for Healthcare Providers: https://www.woods-mathews.com/ This test is not yet approved or cleared by the [place] and  has been authorized for detection and/or diagnosis of SARS-CoV-2 by FDA under an Emergency Use Authorization (EUA). This EUA will remain  in effect (meaning this test can be used) for the duration of the COVID-19 declaration under Section 56 4(b)(1) of the Act, 21 U.S.C. section 360bbb-3(b)(1), unless the authorization is terminated or revoked sooner. Performed at [facility], [place] [address] [place], [place] [address]   Novel Coronavirus, NAA (hospital order; send-out to ref lab)     Status: None   Collection Time: 04/17/19 10:49 AM   Specimen: Nasopharyngeal Swab;  Respiratory  Result Value Ref Range Status   SARS-CoV-2, NAA NOT DETECTED NOT DETECTED Final    Comment: (NOTE) This nucleic acid amplification test was developed and its perfomance characteristics determined by [employer]. Nucleic acid amplification tests include PCR and TMA. This test has not been FDA cleared or approved. This test has been authorized by FDA under an Emergency Use Authorization (EUA). This test is only authorized for the duration of time the declaration that circumstances exist justifying the authorization of the emergency use of in vitro diagnostic tests for detection of SARS-CoV-2 virus and/or diagnosis of COVID-19 infection under section 564(b)(1) of the Act, 21 U.S.C. [id number]) (1), unless the authorization is terminated or revoked sooner. When diagnostic testing is negative, the possibility of a false negative result should be considered in the context of a patient's recent exposures and the presence of clinical signs and symptoms consistent with COVID-19. An individual without symptoms of COVID- 19 and who is not shedding SARS-CoV-2 vir [place] would expect to have a negative (not detected) result in this assay. Performed At: [facility] [address] [place], [place] [id number] Rush Farmer MD [id number]    Bier  Final    Comment: Performed at [facility], [address] [place], [place] [address]  Urine Culture     Status: Abnormal   Collection Time: 04/21/19  1:20 PM   Specimen: Urine, Random  Result Value Ref Range Status   Specimen Description   Final    URINE, RANDOM Performed at [facility], [address] [place], [place] [address]    Special Requests   Final    NONE Performed at [facility], [place], [place] [address]    Culture (A)  Final    >=100,000 COLONIES/mL ESCHERICHIA COLI 50,000 COLONIES/mL ENTEROCOCCUS FAECALIS    Report Status  04/25/2019 FINAL  Final   Organism ID, Bacteria ESCHERICHIA COLI (A)  Final   Organism ID, Bacteria ENTEROCOCCUS FAECALIS (A)  Final      Susceptibility   Escherichia coli - MIC*    AMPICILLIN <=2 SENSITIVE Sensitive     CEFAZOLIN <=4 SENSITIVE Sensitive     CEFTRIAXONE <=1 SENSITIVE Sensitive     CIPROFLOXACIN <=0.25 SENSITIVE Sensitive     GENTAMICIN <=1 SENSITIVE Sensitive     IMIPENEM <=0.25 SENSITIVE Sensitive     NITROFURANTOIN <=16 SENSITIVE Sensitive     TRIMETH/SULFA <=20 SENSITIVE Sensitive     AMPICILLIN/SULBACTAM <=2 SENSITIVE Sensitive     PIP/TAZO <=4 SENSITIVE Sensitive     Extended ESBL NEGATIVE Sensitive     * >=100,000 COLONIES/mL ESCHERICHIA COLI   Enterococcus faecalis - MIC*    AMPICILLIN <=2 SENSITIVE Sensitive     LEVOFLOXACIN 2 SENSITIVE Sensitive     NITROFURANTOIN <=16 SENSITIVE Sensitive  VANCOMYCIN 2 SENSITIVE Sensitive     * 50,000 COLONIES/mL ENTEROCOCCUS FAECALIS    Coagulation Studies: No results for input(s): LABPROT, INR in the last 72 hours.  Urinalysis: No results for input(s): COLORURINE, LABSPEC, PHURINE, GLUCOSEU, HGBUR, BILIRUBINUR, KETONESUR, PROTEINUR, UROBILINOGEN, NITRITE, LEUKOCYTESUR in the last 72 hours.  Invalid input(s): APPERANCEUR    Imaging: No results found.   Medications:   . sodium chloride 250 mL (04/23/19 1754)   . apixaban  2.5 mg Oral BID  . atorvastatin  20 mg Oral q1800  . budesonide (PULMICORT) nebulizer solution  0.25 mg Nebulization BID  . carvedilol  3.125 mg Oral BID WC  . docusate sodium  100 mg Oral BID  . dorzolamide-timolol  1 drop Both Eyes BID  . feeding supplement (ENSURE ENLIVE)  237 mL Oral BID BM  . furosemide  40 mg Intravenous Q12H  . ipratropium-albuterol  3 mL Nebulization Q6H  . latanoprost  1 drop Both Eyes QHS  . levothyroxine  88 mcg Oral Q0600  . multivitamin with minerals  1 tablet Oral Daily  . polyethylene glycol  17 g Oral Daily  . sodium chloride flush  3 mL Intravenous  Q12H  . sodium zirconium cyclosilicate  10 g Oral TID   Followed by  . [START ON 04/29/2019] sodium zirconium cyclosilicate  10 g Oral Daily   sodium chloride, acetaminophen, sodium chloride flush  Assessment/ Plan:  [name] is a 85 y.o. black male  with hypertension, MGUS, hypothyroidism, COPD, atrial fibrillation, who was admitted to [facility] on 04/11/2019 for acute exacerbation of systolic congestive heart failure.   1. Acute renal failure: baseline creatinine of 0.87 on 9/6. Normal GFR.  2. Hyponatremia 3. Hyperkalemia 4. Acute exacerbation of systolic congestive heart failure EF 25-30% 5. Hypertension  - Potassium binders - furosemide IV - Not a candidate for hemodialysis.    LOS: [place] 9/11/202011:13 AM

## 2019-04-27 NOTE — Progress Notes (Signed)
Physical Therapy Treatment Patient Details Name: Gabriel Delacruz MRN: [id number] DOB: Sep 01, 1927 Today's Date: 04/27/2019    History of Present Illness 85 year old male patient with history of chronic atrial fibrillation, COPD, GERD, hyperlipidemia, hypertension, monoclonal gammopathy, hypothyroidism admitted for hypotension, leg swelling, work up showed new onset congestive heart failure    PT Comments    Chart reviewed.  OK by RN for session.  Potassium within limits today.  2 sons in room and agreed to session.  Pt lethargic during session.  Would open eyes briefly and respond appropriately but quickly falls back to sleep.  Does not open eyes or assist with exercise this session.  BLE PROM as below for comfort and attempts to awaken pt for bed mobility skills.  Pt remains asleep and further mobility deferred at this time.  Will continue as appropriate.   Follow Up Recommendations  SNF     Equipment Recommendations  None recommended by PT    Recommendations for Other Services       Precautions / Restrictions Precautions Precautions: Fall Restrictions Weight Bearing Restrictions: No    Mobility  Bed Mobility               General bed mobility comments: too lethargic to assist.  Transfers                 General transfer comment: deferred due to lethargy  Ambulation/Gait                 Stairs             Wheelchair Mobility    Modified Rankin (Stroke Patients Only)       Balance                                            Cognition Arousal/Alertness: Lethargic Behavior During Therapy: WFL for tasks assessed/performed                                   General Comments: lethargic with appropriate brief answers      Exercises Other Exercises Other Exercises: supine PROM for BLE - ankle pumps, heel slides, ab/add - little to no assistance by pt.    General Comments        Pertinent  Vitals/Pain Pain Assessment: Faces Faces Pain Scale: No hurt    Home Living                      Prior Function            PT Goals (current goals can now be found in the care plan section) Progress towards PT goals: Progressing toward goals    Frequency    Min 2X/week      PT Plan Current plan remains appropriate    Co-evaluation              AM-PAC PT "6 Clicks" Mobility   Outcome Measure  Help needed turning from your back to your side while in a flat bed without using bedrails?: Total Help needed moving from lying on your back to sitting on the side of a flat bed without using bedrails?: Total Help needed moving to and from a bed to a chair (including a wheelchair)?: Total Help needed standing up from a chair using  your arms (e.g., wheelchair or bedside chair)?: Total Help needed to walk in hospital room?: Total Help needed climbing 3-5 steps with a railing? : Total 6 Click Score: 6    End of Session   Activity Tolerance: Patient limited by lethargy Patient left: in bed;with call bell/phone within reach;with family/visitor present;with bed alarm set Nurse Communication: Other (comment)       [id number] PT Time Calculation (min) (ACUTE ONLY): 14 min  Charges:  $Therapeutic Exercise: 8-22 mins                     Chesley Noon, PTA 04/27/19, 11:44 AM

## 2019-04-27 NOTE — Consult Note (Addendum)
ANTICOAGULATION CONSULT NOTE - Initial Consult  Pharmacy Consult for apixaban Indication: atrial fibrillation  No Known Allergies  Patient Measurements: Height: 5\' 6"  (167.6 cm) Weight: 177 lb 0.5 oz (80.3 kg) IBW/kg (Calculated) : 63.8  Vital Signs: Temp: 97.6 F (36.4 C) (09/11 0516) BP: 108/67 (09/11 0516) Pulse Rate: 69 (09/11 0516)  Labs: Recent Labs    04/25/19 0705 04/26/19 0429 04/27/19 [id number]  CREATININE 1.58* 1.87* 1.68*    Estimated Creatinine Clearance: 29.7 mL/min (A) (by C-G formula based on SCr of 1.68 mg/dL (H)).   Medical History: Past Medical History:  Diagnosis Date  . Anemia   . Atrial fibrillation ([place])   . COPD (chronic obstructive pulmonary disease) ([place])   . GERD (gastroesophageal reflux disease)   . Glaucoma   . Hyperlipemia   . Hypertension   . Hypothyroidism   . Irregular heart rhythm   . Left anterior fascicular block   . MGUS (monoclonal gammopathy of unknown significance)   . RBBB     Medications:  Medications Prior to Admission  Medication Sig Dispense Refill Last Dose  . albuterol (PROAIR HFA) 108 (90 BASE) MCG/ACT inhaler Inhale 2 puffs into the lungs every 6 (six) hours as needed.    prn at prn  . dorzolamide-timolol (COSOPT) 22.3-6.8 MG/ML ophthalmic solution Place 1 drop into both eyes 2 (two) times daily.    04/11/2019 at Unknown time  . Fluticasone-Salmeterol (ADVAIR DISKUS) 250-50 MCG/DOSE AEPB Inhale 1 puff into the lungs daily.    04/11/2019 at Unknown time  . furosemide (LASIX) 40 MG tablet Take 1 tablet (40 mg total) by mouth daily. 30 tablet 0 04/11/2019 at 1100  . levothyroxine (SYNTHROID) 75 MCG tablet Take 75 mcg by mouth daily before breakfast.   04/11/2019 at Unknown time  . multivitamin-iron-minerals-folic acid (CENTRUM) chewable tablet Chew 1 tablet by mouth daily.    04/11/2019 at 1100  . potassium chloride (K-DUR) 10 MEQ tablet Take 10 mEq by mouth daily.    04/11/2019 at 1100  . Travoprost, BAK Free, (TRAVATAN Z)  0.004 % SOLN ophthalmic solution PLACE 1 DROP INTO BOTH EYES ONCE DAILY   04/10/2019 at Unknown time  . warfarin (COUMADIN) 6 MG tablet Take 6.5 mg by mouth daily at 6 PM.    04/10/2019 at 1800   Scheduled:  . apixaban  2.5 mg Oral BID  . atorvastatin  20 mg Oral q1800  . budesonide (PULMICORT) nebulizer solution  0.25 mg Nebulization BID  . carvedilol  3.125 mg Oral BID WC  . docusate sodium  100 mg Oral BID  . dorzolamide-timolol  1 drop Both Eyes BID  . feeding supplement (ENSURE ENLIVE)  237 mL Oral BID BM  . furosemide  40 mg Intravenous Q12H  . ipratropium-albuterol  3 mL Nebulization Q6H  . latanoprost  1 drop Both Eyes QHS  . levothyroxine  88 mcg Oral Q0600  . multivitamin with minerals  1 tablet Oral Daily  . polyethylene glycol  17 g Oral Daily  . sodium chloride flush  3 mL Intravenous Q12H  . sodium zirconium cyclosilicate  10 g Oral TID   Followed by  . [START ON 04/29/2019] sodium zirconium cyclosilicate  10 g Oral Daily   Infusions:  . sodium chloride 250 mL (04/23/19 1754)   PRN: sodium chloride, acetaminophen, sodium chloride flush Anti-infectives (From admission, onward)   Start     Dose/Rate Route Frequency Ordered Stop   04/24/19 1400  cephALEXin (KEFLEX) capsule 500 mg  Status:  Discontinued     500 mg Oral Every 8 hours 04/24/19 0947 04/25/19 1423   04/21/19 1600  cefTRIAXone (ROCEPHIN) 1 g in sodium chloride 0.9 % 100 mL IVPB  Status:  Discontinued     1 g 200 mL/hr over 30 Minutes Intravenous Every 24 hours 04/21/19 1454 04/24/19 0947      Assessment: Pharmacy consulted to dose apixaban for afib. Pt has been on apixaban 2.5 mg BID since dose reduced 04/26/2019.   Scr in flux, will monitor daily for need of dose adjustment.  Goal of Therapy:  Monitor platelets by anticoagulation protocol: Yes   Plan:  Will continue apixaban dose at 2.5 mg BID, because pt still meets criteria for dose reductions (Pt > 80 yr and Scr > 1.5).   If Scr stabilizes < 1.5,  will need to increase apixaban dose back to 5 mg BID.   Lu Duffel, PharmD, BCPS Clinical Pharmacist 04/27/2019 1:25 PM

## 2019-04-27 NOTE — TOC Progression Note (Signed)
Transition of Care [facility]) - Progression Note    Patient Details  Name: Gabriel Delacruz MRN: [id number] Date of Birth: [birth year]  Transition of Care [facility] CM/SW Contact  Ross Ludwig, [place] Phone Number: 04/27/2019, 5:34 PM  Clinical Narrative:     CSW spoke to patient's sons who were both at bedside.  CSW informed them that [employer] will start insurance authorization again.  Patient's family were asking about different options if insurance denies patient.  CSW discussed options for hospice, private pay, home health, and paying for private caregivers.  Patient's son's were appreciative of information given, CSW informed them as of right now it will be hard to say what the weekend will bring.  CSW to continue to follow patient's progress throughout discharge planning.   Expected Discharge Plan: [place] Barriers to Discharge: Continued Medical Work up  Expected Discharge Plan and Services Expected Discharge Plan: [place] In-house Referral: Clinical Social Work     Living arrangements for the past 2 months: Single Family Home                                       Social Determinants of Health (SDOH) Interventions    Readmission Risk Interventions Readmission Risk Prevention Plan 04/12/2019  Medication Screening Complete  Transportation Screening Complete  Some recent data might be hidden

## 2019-04-27 NOTE — Care Management Important Message (Signed)
Important Message  Patient Details  Name: Gabriel Delacruz MRN: [id number] Date of Birth: [birth year]   Medicare Important Message Given:  Yes     [name] 04/27/2019, 12:33 PM

## 2019-04-27 NOTE — Progress Notes (Signed)
Palliative: Mr. Musumeci is resting quietly in bed, sleeping well.  His son [name] is at bedside.  We talk about current labs, treatment plan, PT consult and discharge.  At this time [name] tells me that family goals is for Mr. Oh to got to rehab, they are hopeful for improvements.   Conference with bedside nursing and SW related to patient condition, needs, discharge.    It remains to be seen if Mr. Klema will be strong enough for meaningful rehab.   Plan: Family goal continues to be dc to rehab to "give him a try".    26 minutes Quinn Axe, NP Palliative Medicine Team Team Phone # [phone] Greater than 50% of this time was spent counseling and coordinating care related to the above assessment and plan.

## 2019-04-27 NOTE — Progress Notes (Signed)
[place] at [facility]                                                                                                                                                                                  Patient Demographics   Josten Covington, is a 85 y.o. male, DOB - 10/03/1927, [id number]  Admit date - 04/11/2019   Admitting Physician Christel Mormon, MD  Outpatient Primary MD for the patient is Marinda Elk, MD   LOS - 16  Subjective: Patient doing better today 2 sons at bedside    Review of Systems:   CONSTITUTIONAL: Unable to provide due to confusion Vitals:   Vitals:   04/26/19 2105 04/27/19 0250 04/27/19 0516 04/27/19 0738  BP:   108/67   Pulse:   69   Resp:   20   Temp:   97.6 F (36.4 C)   TempSrc:      SpO2: 97% 98% 98% 97%  Weight:   80.3 kg   Height:        Wt Readings from Last 3 Encounters:  04/27/19 80.3 kg  10/19/18 80.3 kg  07/04/18 77.3 kg     Intake/Output Summary (Last 24 hours) at 04/27/2019 1344 Last data filed at 04/27/2019 0700 Gross per 24 hour  Intake -  Output 450 ml  Net -450 ml    Physical Exam:   GENERAL: Pleasant-appearing in no apparent distress.  HEAD, EYES, EARS, NOSE AND THROAT: Atraumatic, normocephalic. Extraocular muscles are intact. Pupils equal and reactive to light. Sclerae anicteric. No conjunctival injection. No oro-pharyngeal erythema.  NECK: Supple. There is no jugular venous distention. No bruits, no lymphadenopathy, no thyromegaly.  HEART: Regular rate and rhythm,.  Positive murmurs, no rubs, no clicks.  LUNGS: Clear to auscultation bilaterally. No rales or rhonchi. No wheezes.  ABDOMEN: Soft, flat, nontender, nondistended. Has good bowel sounds. No hepatosplenomegaly appreciated.  EXTREMITIES: No evidence of any cyanosis, clubbing, or peripheral edema.  +2 pedal and radial pulses bilaterally.  NEUROLOGIC: Patient more awake SKIN: Moist and warm with no rashes appreciated.  Psych: Not  anxious, depressed LN: No inguinal LN enlargement    Antibiotics   Anti-infectives (From admission, onward)   Start     Dose/Rate Route Frequency Ordered Stop   04/24/19 1400  cephALEXin (KEFLEX) capsule 500 mg  Status:  Discontinued     500 mg Oral Every 8 hours 04/24/19 0947 04/25/19 1423   04/21/19 1600  cefTRIAXone (ROCEPHIN) 1 g in sodium chloride 0.9 % 100 mL IVPB  Status:  Discontinued     1 g 200 mL/hr over 30 Minutes Intravenous Every 24 hours 04/21/19 1454 04/24/19 0947  Medications   Scheduled Meds: . apixaban  2.5 mg Oral BID  . atorvastatin  20 mg Oral q1800  . budesonide (PULMICORT) nebulizer solution  0.25 mg Nebulization BID  . carvedilol  3.125 mg Oral BID WC  . docusate sodium  100 mg Oral BID  . dorzolamide-timolol  1 drop Both Eyes BID  . feeding supplement (ENSURE ENLIVE)  237 mL Oral BID BM  . furosemide  40 mg Intravenous Q12H  . ipratropium-albuterol  3 mL Nebulization Q6H  . latanoprost  1 drop Both Eyes QHS  . levothyroxine  88 mcg Oral Q0600  . multivitamin with minerals  1 tablet Oral Daily  . polyethylene glycol  17 g Oral Daily  . sodium chloride flush  3 mL Intravenous Q12H  . sodium zirconium cyclosilicate  10 g Oral TID   Followed by  . [START ON 04/29/2019] sodium zirconium cyclosilicate  10 g Oral Daily   Continuous Infusions: . sodium chloride 250 mL (04/23/19 1754)   PRN Meds:.sodium chloride, acetaminophen, sodium chloride flush   Data Review:   Micro Results Recent Results (from the past 240 hour(s))  Urine Culture     Status: Abnormal   Collection Time: 04/21/19  1:20 PM   Specimen: Urine, Random  Result Value Ref Range Status   Specimen Description   Final    URINE, RANDOM Performed at [facility], [address] [place], [place] [address]    Special Requests   Final    NONE Performed at [facility], [address] [place], [place] [address]    Culture (A)  Final    >=100,000 COLONIES/mL  ESCHERICHIA COLI 50,000 COLONIES/mL ENTEROCOCCUS FAECALIS    Report Status 04/25/2019 FINAL  Final   Organism ID, Bacteria ESCHERICHIA COLI (A)  Final   Organism ID, Bacteria ENTEROCOCCUS FAECALIS (A)  Final      Susceptibility   Escherichia coli - MIC*    AMPICILLIN <=2 SENSITIVE Sensitive     CEFAZOLIN <=4 SENSITIVE Sensitive     CEFTRIAXONE <=1 SENSITIVE Sensitive     CIPROFLOXACIN <=0.25 SENSITIVE Sensitive     GENTAMICIN <=1 SENSITIVE Sensitive     IMIPENEM <=0.25 SENSITIVE Sensitive     NITROFURANTOIN <=16 SENSITIVE Sensitive     TRIMETH/SULFA <=20 SENSITIVE Sensitive     AMPICILLIN/SULBACTAM <=2 SENSITIVE Sensitive     PIP/TAZO <=4 SENSITIVE Sensitive     Extended ESBL NEGATIVE Sensitive     * >=100,000 COLONIES/mL ESCHERICHIA COLI   Enterococcus faecalis - MIC*    AMPICILLIN <=2 SENSITIVE Sensitive     LEVOFLOXACIN 2 SENSITIVE Sensitive     NITROFURANTOIN <=16 SENSITIVE Sensitive     VANCOMYCIN 2 SENSITIVE Sensitive     * 50,000 COLONIES/mL ENTEROCOCCUS FAECALIS    Radiology Reports Ct Head Wo Contrast  Result Date: 04/20/2019 CLINICAL DATA:  Altered mental status. EXAM: CT HEAD WITHOUT CONTRAST TECHNIQUE: Contiguous axial images were obtained from the base of the skull through the vertex without intravenous contrast. COMPARISON:  None FINDINGS: Brain: Hypoattenuation in the left frontal operculum and involving the anterior insular ribbon is likely remote with some evidence for volume loss occluding ex vacuo dilation of the left lateral ventricle. Moderate diffuse atrophy and white matter disease is present otherwise. Basal ganglia are intact. The remainder of the left insular ribbon and the entire right insular ribbon are intact. The brainstem and cerebellum are within normal limits. No significant extraaxial fluid collection is present. Vascular: Diffuse vascular calcifications are present. There  are calcifications in the distal M1 segments bilaterally. Dense calcifications  are present in the cavernous internal carotid arteries and at the dural margin of both vertebral arteries. Skull: Calvarium is intact. No focal lytic or blastic lesions are present. Sinuses/Orbits: The paranasal sinuses and mastoid air cells are clear. Bilateral lens replacements are noted. Globes and orbits are otherwise unremarkable. IMPRESSION: 1. 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 2. Atrophy and white matter disease without other focal infarct. 3. Atherosclerosis Electronically Signed   By: San Morelle M.D.   On: 04/20/2019 18:15   US Carotid Bilateral  Result Date: 04/22/2019 CLINICAL DATA:  Dizziness. History of CAD, hypertension, hyperlipidemia and smoking. EXAM: BILATERAL CAROTID DUPLEX ULTRASOUND TECHNIQUE: Pearline Cables scale imaging, color Doppler and duplex ultrasound were performed of bilateral carotid and vertebral arteries in the neck. COMPARISON:  None. FINDINGS: Criteria: Quantification of carotid stenosis is based on velocity parameters that correlate the residual internal carotid diameter with NASCET-based stenosis levels, using the diameter of the distal internal carotid lumen as the denominator for stenosis measurement. The following velocity measurements were obtained: RIGHT ICA: 86/16 cm/sec CCA: [id number] cm/sec SYSTOLIC ICA/CCA RATIO:  1.8 ECA: 24 cm/sec LEFT ICA: 79/18 cm/sec CCA: [id number] cm/sec SYSTOLIC ICA/CCA RATIO:  2.2 ECA: 84 cm/sec RIGHT CAROTID ARTERY: There is a minimal amount of intimal thickening/atherosclerotic plaque seen throughout the right common carotid artery (images 3, 7 and 11). There is a moderate amount of eccentric echogenic partially shadowing plaque within the right carotid bulb (image 15), extending to involve the origin and proximal aspects of the right internal carotid artery (image 23), not resulting in elevated peak systolic velocities  within the interrogated course of the right internal carotid artery to suggest a hemodynamically significant stenosis. RIGHT VERTEBRAL ARTERY:  Antegrade flow LEFT CAROTID ARTERY: There is a minimal amount of intimal thickening seen throughout the left common carotid artery (representative image 44). There is a moderate amount of eccentric echogenic partially shadowing plaque within the left carotid bulb (images 47 and 48), extending to involve the origin and proximal aspects of the left internal carotid artery (image 55), not resulting in elevated peak systolic velocities within the interrogated course of the left internal carotid artery to suggest a hemodynamically significant stenosis. LEFT VERTEBRAL ARTERY:  Antegrade flow Note is made of a cardiac arrhythmia (representative image 50). IMPRESSION: 1. Moderate amount of bilateral atherosclerotic plaque, right subjectively greater than left, not resulting in a hemodynamically significant stenosis within either internal artery. 2. Incidental made of a cardiac arrhythmia. Further evaluation with ECG monitoring could be performed as indicated Electronically Signed   By: Sandi Mariscal M.D.   On: 04/22/2019 08:25   Dg Chest Port 1 View  Result Date: 04/25/2019 CLINICAL DATA:  Initial evaluation for acute CHF. EXAM: PORTABLE CHEST 1 VIEW COMPARISON:  Prior radiograph from 04/20/2019. FINDINGS: Moderate cardiomegaly, stable. Mediastinal silhouette within normal limits. Lung volumes are reduced. Veiling opacities overlying the bilateral hemidiaphragms most consistent with pleural effusions. Perihilar vascular congestion with mild interstitial prominence without frank pulmonary edema. Superimposed bibasilar opacities likely reflect atelectasis and/or effusions. Infiltrates would be difficult to exclude, and could be considered in the correct clinical setting. No pneumothorax. No acute osseous finding. Degenerative changes noted about the shoulders bilaterally, right  greater than left. IMPRESSION: 1. Veiling opacities overlying the bilateral hemidiaphragms, most consistent with pleural effusions. Superimposed bibasilar opacities likely reflect atelectasis and/or  effusions. Infiltrates would be difficult to exclude, and could be considered in the correct clinical setting. 2. Cardiomegaly with perihilar vascular congestion and interstitial prominence without frank pulmonary edema. Electronically Signed   By: Jeannine Boga M.D.   On: 04/25/2019 00:59   Dg Chest Port 1 View  Result Date: 04/20/2019 CLINICAL DATA:  Cough. EXAM: PORTABLE CHEST 1 VIEW COMPARISON:  Radiograph April 11, 2019. FINDINGS: Stable cardiomegaly. No pneumothorax is noted. Atherosclerosis of thoracic aorta is noted. Mild bibasilar atelectasis or infiltrates are noted with small pleural effusions. Bony thorax is unremarkable. IMPRESSION: Mild bibasilar subsegmental atelectasis or infiltrates are noted with associated small pleural effusions. Aortic Atherosclerosis (ICD10-I70.0). Electronically Signed   By: Marijo Conception M.D.   On: 04/20/2019 16:41   Dg Chest Port 1 View  Result Date: 04/11/2019 CLINICAL DATA:  Leg and ankle swelling, low blood pressure EXAM: PORTABLE CHEST 1 VIEW COMPARISON:  10/13/2018 FINDINGS: Gross cardiomegaly. Subtle heterogeneous opacity of the right lung base with a probable small layering pleural effusion. The visualized skeletal structures are unremarkable. IMPRESSION: Gross cardiomegaly. Subtle heterogeneous opacity of the right lung base with a probable small layering pleural effusion, concerning for infection or aspiration. Electronically Signed   By: Eddie Candle M.D.   On: 04/11/2019 16:59     CBC Recent Labs  Lab 04/21/19 0538 04/22/19 0713 04/24/19 0628  WBC 4.0 4.2 4.7  HGB 12.3* 12.5* 13.3  HCT 36.6* 36.9* 39.5  PLT 155 170 188  MCV 105.8* 106.3* 105.3*  MCH 35.5* 36.0* 35.5*  MCHC 33.6 33.9 33.7  RDW 13.1 12.9 12.6    Chemistries  Recent  Labs  Lab 04/22/19 0713 04/24/19 0628 04/24/19 1304 04/24/19 1827 04/25/19 0705 04/26/19 0429 04/27/19 0623  NA 133* 131*  --  129* 131*  --  133*  K 4.8 6.0* 6.0* 6.0* 5.9* 5.8* 5.0  CL 97* 95*  --  94* 95*  --  96*  CO2 29 25  --  22 22  --  23  GLUCOSE 99 132*  --  130* 108*  --  83  BUN 27* 46*  --  52* 57*  --  80*  CREATININE 0.87 1.22  --  1.44* 1.58* 1.87* 1.68*  CALCIUM 8.7* 8.8*  --  9.1 9.2  --  9.0   ------------------------------------------------------------------------------------------------------------------ estimated creatinine clearance is 29.7 mL/min (A) (by C-G formula based on SCr of 1.68 mg/dL (H)). ------------------------------------------------------------------------------------------------------------------ No results for input(s): HGBA1C in the last 72 hours. ------------------------------------------------------------------------------------------------------------------ No results for input(s): CHOL, HDL, LDLCALC, TRIG, CHOLHDL, LDLDIRECT in the last 72 hours. ------------------------------------------------------------------------------------------------------------------ No results for input(s): TSH, T4TOTAL, T3FREE, THYROIDAB in the last 72 hours.  Invalid input(s): FREET3 ------------------------------------------------------------------------------------------------------------------ No results for input(s): VITAMINB12, FOLATE, FERRITIN, TIBC, IRON, RETICCTPCT in the last 72 hours.  Coagulation profile Recent Labs  Lab 04/21/19 0538 04/22/19 0713  INR 1.5* 1.7*    No results for input(s): DDIMER in the last 72 hours.  Cardiac Enzymes No results for input(s): CKMB, TROPONINI, MYOGLOBIN in the last 168 hours.  Invalid input(s): CK ------------------------------------------------------------------------------------------------------------------ Invalid input(s): POCBNP    Assessment & Plan   85 year old male patient with history of  chronic atrial fibrillation, COPD, GERD, hyperlipidemia, hypertension, monoclonal gammopathy, hypothyroidism currently under hospitalist service  -New onset systolic CHF Continue diuresis with Lasix nephrology is trying IV Lasix Continue Coreg  -Severe tricuspid and mitral regurg prognosis very poor  Patient having intermittent episodes of shortness of breath  -Hyperkalemia due to potassium supplements Since will be  switched to low kalema   -Chronic atrial fibrillation Continue therapy with Eliquis   -Chronic CVA noted during this hospitalization  -COPD appears stable Home dose inhalers  -DVT prophylaxis Continue Eliquis.  -Disposition awaiting  reauthorization from insurance for nursing home placement      Code Status Orders  (From admission, onward)         Start     Ordered   04/11/19 2145  Full code  Continuous     04/11/19 2144        Code Status History    Date Active Date Inactive Code Status Order ID Comments User Context   10/13/2018 2302 10/19/2018 2134 Full Code [id number]  Demetrios Loll, MD Inpatient   Advance Care Planning Activity    Advance Directive Documentation     Most Recent Value  Type of Advance Directive  Healthcare Power of Attorney  Pre-existing out of facility DNR order (yellow form or pink MOST form)  -  "MOST" Form in Place?  -           Consults  cardology  DVT Prophylaxis  eliquis  Lab Results  Component Value Date   PLT 188 04/24/2019     Time Spent in minutes   6min Greater than 50% of time spent in care coordination and counseling patient regarding the condition and plan of care.   Dustin Flock M.D on 04/27/2019 at 1:44 PM  Between 7am to 6pm - Pager - [phone]  After 6pm go to [email or web address] - Proofreader  [employer]   Office  [phone]

## 2019-04-28 DIAGNOSIS — L8990 Pressure ulcer of unspecified site, unspecified stage: Secondary | ICD-10-CM | POA: Insufficient documentation

## 2019-04-28 LAB — BASIC METABOLIC PANEL
Anion gap: 13 (ref 5–15)
BUN: 79 mg/dL — ABNORMAL HIGH (ref 8–23)
CO2: 28 mmol/L (ref 22–32)
Calcium: 8.8 mg/dL — ABNORMAL LOW (ref 8.9–10.3)
Chloride: 96 mmol/L — ABNORMAL LOW (ref 98–111)
Creatinine, Ser: 1.54 mg/dL — ABNORMAL HIGH (ref 0.61–1.24)
GFR calc Af Amer: 46 mL/min — ABNORMAL LOW (ref >60)
GFR calc non Af Amer: 39 mL/min — ABNORMAL LOW (ref >60)
Glucose, Bld: 97 mg/dL (ref 70–99)
Potassium: 4.1 mmol/L (ref 3.5–5.1)
Sodium: 137 mmol/L (ref 135–145)

## 2019-04-28 LAB — CBC
HCT: 38.6 % — ABNORMAL LOW (ref 39.0–52.0)
Hemoglobin: 13.1 g/dL (ref 13.0–17.0)
MCH: 35.2 pg — ABNORMAL HIGH (ref 26.0–34.0)
MCHC: 33.9 g/dL (ref 30.0–36.0)
MCV: 103.8 fL — ABNORMAL HIGH (ref 80.0–100.0)
Platelets: 206 10*3/uL (ref 150–400)
RBC: 3.72 MIL/uL — ABNORMAL LOW (ref 4.22–5.81)
RDW: 13.3 % (ref 11.5–15.5)
WBC: 5.1 10*3/uL (ref 4.0–10.5)
nRBC: 1.4 % — ABNORMAL HIGH (ref 0.0–0.2)

## 2019-04-28 NOTE — Plan of Care (Signed)
Patient is lethargic and withdrawn from communication. Education and communication has been provided to patient's son this shift. Continue to support drinking ensure with small sips. The patient prefers vanilla ensure.

## 2019-04-28 NOTE — TOC Progression Note (Signed)
Transition of Care [facility]) - Progression Note    Patient Details  Name: Gabriel Delacruz MRN: [id number] Date of Birth: 06-Sep-1927  [facility] CM/SW Contact  Ross Ludwig, [place] Phone Number: 04/28/2019, 5:40 PM  Clinical Narrative:    CSW updated patient's son [name] [phone] and daughter [name] [phone] that this CSW will be on PAL next week, and that the other case manager and social worker will continue to follow patient.  CSW informed them that PT was requested to see him over the weekend or on Monday due to therapy note only good for insurance for 48 hours.  Magda Paganini at [employer] restarted insurance auth on Friday awaiting approval or denial.  CSW to continue to follow patient's progress throughout discharge planning.    Expected Discharge Plan: [place] Barriers to Discharge: Continued Medical Work up  Expected Discharge Plan and Services Expected Discharge Plan: [place] In-house Referral: Clinical Social Work     Living arrangements for the past 2 months: Single Family Home                                       Social Determinants of Health (SDOH) Interventions    Readmission Risk Interventions Readmission Risk Prevention Plan 04/12/2019  Medication Screening Complete  Transportation Screening Complete  Some recent data might be hidden

## 2019-04-28 NOTE — Progress Notes (Addendum)
[place] [facility]                                                                                                                                                                                Patient Demographics   Gabriel Delacruz, is a 85 y.o. male, DOB - [birth year], [id number]  Admit date - 04/11/2019   Admitting Physician Christel Mormon, MD  Outpatient Primary MD for the patient is Marinda Elk, MD   LOS - 17  Subjective: Patient states he is feeling okay today.  He denies any chest pain, shortness of breath, or lower extremity edema.  No concerns this morning.  Review of Systems:   CONSTITUTIONAL: Unable to provide due to confusion Vitals:   Vitals:   04/28/19 0153 04/28/19 0345 04/28/19 0347 04/28/19 1008  BP:   105/67 114/60  Pulse:   69 71  Resp:   20   Temp:   98 F (36.7 C)   TempSrc:      SpO2: 97%  97% 97%  Weight:  75.2 kg    Height:        Wt Readings from Last 3 Encounters:  04/28/19 75.2 kg  10/19/18 80.3 kg  07/04/18 77.3 kg     Intake/Output Summary (Last 24 hours) at 04/28/2019 1237 Last data filed at 04/27/2019 1917 Gross per 24 hour  Intake -  Output 0 ml  Net 0 ml    Physical Exam:   GENERAL: Pleasant-appearing in no apparent distress.  HEENT: Atraumatic, normocephalic. Extraocular muscles are intact. Pupils equal and reactive to light. Sclerae anicteric. No conjunctival injection. No oro-pharyngeal erythema.  NECK: Supple. There is no jugular venous distention. No bruits, no lymphadenopathy, no thyromegaly.  HEART: Regular rate and rhythm,.  Positive murmurs, no rubs, no clicks.  LUNGS: + Diminished breath sounds in the lung bases bilaterally. No rales or rhonchi. No wheezes.  ABDOMEN: Soft, flat, nontender, nondistended. Has good bowel sounds. No hepatosplenomegaly appreciated.  EXTREMITIES: No evidence of any cyanosis, clubbing. 2+ pitting edema to the mid-shin bilaterally. NEUROLOGIC: Alert, able to  move all extremities. SKIN: Moist and warm with no rashes appreciated.  Psych: Alert and oriented x 1. Calm.   Antibiotics   Anti-infectives (From admission, onward)   Start     Dose/Rate Route Frequency Ordered Stop   04/24/19 1400  cephALEXin (KEFLEX) capsule 500 mg  Status:  Discontinued     500 mg Oral Every 8 hours 04/24/19 0947 04/25/19 1423   04/21/19 1600  cefTRIAXone (ROCEPHIN) 1 g in sodium chloride 0.9 % 100 mL IVPB  Status:  Discontinued     1 g 200 mL/hr over 30  Minutes Intravenous Every 24 hours 04/21/19 1454 04/24/19 0947      Medications   Scheduled Meds: . apixaban  2.5 mg Oral BID  . atorvastatin  20 mg Oral q1800  . budesonide (PULMICORT) nebulizer solution  0.25 mg Nebulization BID  . carvedilol  3.125 mg Oral BID WC  . docusate sodium  100 mg Oral BID  . dorzolamide-timolol  1 drop Both Eyes BID  . feeding supplement (ENSURE ENLIVE)  237 mL Oral BID BM  . furosemide  40 mg Intravenous Q12H  . ipratropium-albuterol  3 mL Nebulization Q6H  . latanoprost  1 drop Both Eyes QHS  . levothyroxine  88 mcg Oral Q0600  . multivitamin with minerals  1 tablet Oral Daily  . polyethylene glycol  17 g Oral Daily  . sodium chloride flush  3 mL Intravenous Q12H  . sodium zirconium cyclosilicate  10 g Oral TID   Followed by  . [START ON 04/29/2019] sodium zirconium cyclosilicate  10 g Oral Daily   Continuous Infusions: . sodium chloride 250 mL (04/23/19 1754)   PRN Meds:.sodium chloride, acetaminophen, sodium chloride flush   Data Review:   Micro Results Recent Results (from the past 240 hour(s))  Urine Culture     Status: Abnormal   Collection Time: 04/21/19  1:20 PM   Specimen: Urine, Random  Result Value Ref Range Status   Specimen Description   Final    URINE, RANDOM Performed at [facility], [address] [place], [place] [address]    Special Requests   Final    NONE Performed at [facility], [address] [place], [place]  [address]    Culture (A)  Final    >=100,000 COLONIES/mL ESCHERICHIA COLI 50,000 COLONIES/mL ENTEROCOCCUS FAECALIS    Report Status 04/25/2019 FINAL  Final   Organism ID, Bacteria ESCHERICHIA COLI (A)  Final   Organism ID, Bacteria ENTEROCOCCUS FAECALIS (A)  Final      Susceptibility   Escherichia coli - MIC*    AMPICILLIN <=2 SENSITIVE Sensitive     CEFAZOLIN <=4 SENSITIVE Sensitive     CEFTRIAXONE <=1 SENSITIVE Sensitive     CIPROFLOXACIN <=0.25 SENSITIVE Sensitive     GENTAMICIN <=1 SENSITIVE Sensitive     IMIPENEM <=0.25 SENSITIVE Sensitive     NITROFURANTOIN <=16 SENSITIVE Sensitive     TRIMETH/SULFA <=20 SENSITIVE Sensitive     AMPICILLIN/SULBACTAM <=2 SENSITIVE Sensitive     PIP/TAZO <=4 SENSITIVE Sensitive     Extended ESBL NEGATIVE Sensitive     * >=100,000 COLONIES/mL ESCHERICHIA COLI   Enterococcus faecalis - MIC*    AMPICILLIN <=2 SENSITIVE Sensitive     LEVOFLOXACIN 2 SENSITIVE Sensitive     NITROFURANTOIN <=16 SENSITIVE Sensitive     VANCOMYCIN 2 SENSITIVE Sensitive     * 50,000 COLONIES/mL ENTEROCOCCUS FAECALIS    Radiology Reports Ct Head Wo Contrast  Result Date: 04/20/2019 CLINICAL DATA:  Altered mental status. EXAM: CT HEAD WITHOUT CONTRAST TECHNIQUE: Contiguous axial images were obtained from the base of the skull through the vertex without intravenous contrast. COMPARISON:  None FINDINGS: Brain: Hypoattenuation in the left frontal operculum and involving the anterior insular ribbon is likely remote with some evidence for volume loss occluding ex vacuo dilation of the left lateral ventricle. Moderate diffuse atrophy and white matter disease is present otherwise. Basal ganglia are intact. The remainder of the left insular ribbon and the entire right insular ribbon are intact. The brainstem and cerebellum are within normal limits.  No significant extraaxial fluid collection is present. Vascular: Diffuse vascular calcifications are present. There are calcifications in the  distal M1 segments bilaterally. Dense calcifications are present in the cavernous internal carotid arteries and at the dural margin of both vertebral arteries. Skull: Calvarium is intact. No focal lytic or blastic lesions are present. Sinuses/Orbits: The paranasal sinuses and mastoid air cells are clear. Bilateral lens replacements are noted. Globes and orbits are otherwise unremarkable. IMPRESSION: 1. 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 2. Atrophy and white matter disease without other focal infarct. 3. Atherosclerosis Electronically Signed   By: San Morelle M.D.   On: 04/20/2019 18:15   US Carotid Bilateral  Result Date: 04/22/2019 CLINICAL DATA:  Dizziness. History of CAD, hypertension, hyperlipidemia and smoking. EXAM: BILATERAL CAROTID DUPLEX ULTRASOUND TECHNIQUE: Pearline Cables scale imaging, color Doppler and duplex ultrasound were performed of bilateral carotid and vertebral arteries in the neck. COMPARISON:  None. FINDINGS: Criteria: Quantification of carotid stenosis is based on velocity parameters that correlate the residual internal carotid diameter with NASCET-based stenosis levels, using the diameter of the distal internal carotid lumen as the denominator for stenosis measurement. The following velocity measurements were obtained: RIGHT ICA: 86/16 cm/sec CCA: [id number] cm/sec SYSTOLIC ICA/CCA RATIO:  1.8 ECA: 24 cm/sec LEFT ICA: 79/18 cm/sec CCA: [id number] cm/sec SYSTOLIC ICA/CCA RATIO:  2.2 ECA: 84 cm/sec RIGHT CAROTID ARTERY: There is a minimal amount of intimal thickening/atherosclerotic plaque seen throughout the right common carotid artery (images 3, 7 and 11). There is a moderate amount of eccentric echogenic partially shadowing plaque within the right carotid bulb (image 15), extending to involve the origin and proximal aspects of the right internal carotid artery (image  23), not resulting in elevated peak systolic velocities within the interrogated course of the right internal carotid artery to suggest a hemodynamically significant stenosis. RIGHT VERTEBRAL ARTERY:  Antegrade flow LEFT CAROTID ARTERY: There is a minimal amount of intimal thickening seen throughout the left common carotid artery (representative image 44). There is a moderate amount of eccentric echogenic partially shadowing plaque within the left carotid bulb (images 47 and 48), extending to involve the origin and proximal aspects of the left internal carotid artery (image 55), not resulting in elevated peak systolic velocities within the interrogated course of the left internal carotid artery to suggest a hemodynamically significant stenosis. LEFT VERTEBRAL ARTERY:  Antegrade flow Note is made of a cardiac arrhythmia (representative image 50). IMPRESSION: 1. Moderate amount of bilateral atherosclerotic plaque, right subjectively greater than left, not resulting in a hemodynamically significant stenosis within either internal artery. 2. Incidental made of a cardiac arrhythmia. Further evaluation with ECG monitoring could be performed as indicated Electronically Signed   By: Sandi Mariscal M.D.   On: 04/22/2019 08:25   Dg Chest Port 1 View  Result Date: 04/25/2019 CLINICAL DATA:  Initial evaluation for acute CHF. EXAM: PORTABLE CHEST 1 VIEW COMPARISON:  Prior radiograph from 04/20/2019. FINDINGS: Moderate cardiomegaly, stable. Mediastinal silhouette within normal limits. Lung volumes are reduced. Veiling opacities overlying the bilateral hemidiaphragms most consistent with pleural effusions. Perihilar vascular congestion with mild interstitial prominence without frank pulmonary edema. Superimposed bibasilar opacities likely reflect atelectasis and/or effusions. Infiltrates would be difficult to exclude, and could be considered in the correct clinical setting. No pneumothorax. No acute osseous finding. Degenerative  changes noted about the shoulders bilaterally, right greater than left. IMPRESSION: 1. Veiling opacities overlying the  bilateral hemidiaphragms, most consistent with pleural effusions. Superimposed bibasilar opacities likely reflect atelectasis and/or effusions. Infiltrates would be difficult to exclude, and could be considered in the correct clinical setting. 2. Cardiomegaly with perihilar vascular congestion and interstitial prominence without frank pulmonary edema. Electronically Signed   By: Jeannine Boga M.D.   On: 04/25/2019 00:59   Dg Chest Port 1 View  Result Date: 04/20/2019 CLINICAL DATA:  Cough. EXAM: PORTABLE CHEST 1 VIEW COMPARISON:  Radiograph April 11, 2019. FINDINGS: Stable cardiomegaly. No pneumothorax is noted. Atherosclerosis of thoracic aorta is noted. Mild bibasilar atelectasis or infiltrates are noted with small pleural effusions. Bony thorax is unremarkable. IMPRESSION: Mild bibasilar subsegmental atelectasis or infiltrates are noted with associated small pleural effusions. Aortic Atherosclerosis (ICD10-I70.0). Electronically Signed   By: Marijo Conception M.D.   On: 04/20/2019 16:41   Dg Chest Port 1 View  Result Date: 04/11/2019 CLINICAL DATA:  Leg and ankle swelling, low blood pressure EXAM: PORTABLE CHEST 1 VIEW COMPARISON:  10/13/2018 FINDINGS: Gross cardiomegaly. Subtle heterogeneous opacity of the right lung base with a probable small layering pleural effusion. The visualized skeletal structures are unremarkable. IMPRESSION: Gross cardiomegaly. Subtle heterogeneous opacity of the right lung base with a probable small layering pleural effusion, concerning for infection or aspiration. Electronically Signed   By: Eddie Candle M.D.   On: 04/11/2019 16:59     CBC Recent Labs  Lab 04/22/19 0713 04/24/19 0628 04/28/19 0541  WBC 4.2 4.7 5.1  HGB 12.5* 13.3 13.1  HCT 36.9* 39.5 38.6*  PLT 170 188 206  MCV 106.3* 105.3* 103.8*  MCH 36.0* 35.5* 35.2*  MCHC 33.9 33.7  33.9  RDW 12.9 12.6 13.3    Chemistries  Recent Labs  Lab 04/24/19 0628  04/24/19 1827 04/25/19 0705 04/26/19 0429 04/27/19 0623 04/28/19 0541  NA 131*  --  129* 131*  --  133* 137  K 6.0*   < > 6.0* 5.9* 5.8* 5.0 4.1  CL 95*  --  94* 95*  --  96* 96*  CO2 25  --  22 22  --  23 28  GLUCOSE 132*  --  130* 108*  --  83 97  BUN 46*  --  52* 57*  --  80* 79*  CREATININE 1.22  --  1.44* 1.58* 1.87* 1.68* 1.54*  CALCIUM 8.8*  --  9.1 9.2  --  9.0 8.8*   < > = values in this interval not displayed.   ------------------------------------------------------------------------------------------------------------------ estimated creatinine clearance is 29.3 mL/min (A) (by C-G formula based on SCr of 1.54 mg/dL (H)). ------------------------------------------------------------------------------------------------------------------ No results for input(s): HGBA1C in the last 72 hours. ------------------------------------------------------------------------------------------------------------------ No results for input(s): CHOL, HDL, LDLCALC, TRIG, CHOLHDL, LDLDIRECT in the last 72 hours. ------------------------------------------------------------------------------------------------------------------ No results for input(s): TSH, T4TOTAL, T3FREE, THYROIDAB in the last 72 hours.  Invalid input(s): FREET3 ------------------------------------------------------------------------------------------------------------------ No results for input(s): VITAMINB12, FOLATE, FERRITIN, TIBC, IRON, RETICCTPCT in the last 72 hours.  Coagulation profile Recent Labs  Lab 04/22/19 0713  INR 1.7*    No results for input(s): DDIMER in the last 72 hours.  Cardiac Enzymes No results for input(s): CKMB, TROPONINI, MYOGLOBIN in the last 168 hours.  Invalid input(s): CK ------------------------------------------------------------------------------------------------------------------ Invalid input(s):  POCBNP    Assessment & Plan   Acute systolic CHF- new diagnosis.  -ECHO 8/27 with EF 25-30% -Continue IV lasix 40mg  bid -Continue coreg -No ACE due to renal insufficiency -Nephrology following -Palliative care following- family would like for patient to go to SNF -Awaiting  SNF placement  Severe tricuspid and mitral regurgitation- seen on ECHO -Patient is not a candidate for aggressive intervention -Treatment as above  Acute renal failure- may be due to cardiorenal syndrome. Creatinine has been improving. -Avoid nephrotoxic agents -Monitor  Chronic atrial fibrillation- pacemaker in place. -Continue Coreg and Eliquis  Subacute/chronic left frontal subcortical stroke -Seen on CT head this admission -Unable to obtain MRI due to pacemaker -Continue eliquis -PT recommending SNF  COPD- stable, no signs of acute exacerbation -Continue home inhalers  DVT prophylaxis- Eliquis      Code Status Orders  (From admission, onward)         Start     Ordered   04/11/19 2145  Full code  Continuous     04/11/19 2144        Code Status History    Date Active Date Inactive Code Status Order ID Comments User Context   10/13/2018 2302 10/19/2018 2134 Full Code [id number]  Demetrios Loll, MD Inpatient   Advance Care Planning Activity    Advance Directive Documentation     Most Recent Value  Type of Advance Directive  Healthcare Power of Attorney  Pre-existing out of facility DNR order (yellow form or pink MOST form)  -  "MOST" Form in Place?  -     Consults nephrology  DVT Prophylaxis  eliquis  Lab Results  Component Value Date   PLT 206 04/28/2019     Time Spent in minutes   76min Greater than 50% of time spent in care coordination and counseling patient regarding the condition and plan of care.   Berna Spare Cordarryl Monrreal M.D on 04/28/2019 at 12:37 PM  Between 7am to 6pm - Pager - [phone]  After 6pm go to [email or web address] - Proofreader  [employer]   Office   [phone]

## 2019-04-28 NOTE — Progress Notes (Signed)
[facility]  ROUNDING NOTE   Subjective:   Son at bedside.   Patient taking PO. Laying in bed. Alert to self.   Objective:  Vital signs in last 24 hours:  Temp:  [97.6 F (36.4 C)-98 F (36.7 C)] 98 F (36.7 C) (09/12 0347) Pulse Rate:  [69-71] 71 (09/12 1008) Resp:  [16-20] 20 (09/12 0347) BP: (101-114)/(60-73) 114/60 (09/12 1008) SpO2:  [95 %-100 %] 97 % (09/12 1008) Weight:  [75.2 kg] 75.2 kg (09/12 0345)  Weight change: -5.1 kg Filed Weights   04/25/19 0416 04/27/19 0516 04/28/19 0345  Weight: 80 kg 80.3 kg 75.2 kg    Intake/Output: I/O last 3 completed shifts: In: -  Out: 450 [Urine:450]   Intake/Output this shift:  No intake/output data recorded.  Physical Exam: General: NAD, laying in bed  Head: Normocephalic, atraumatic. Moist oral mucosal membranes  Eyes: Anicteric, PERRL  Neck: Supple, trachea midline  Lungs:  Clear bilaterally  Heart: Regular rate and rhythm, +murmur  Abdomen:  Soft, nontender,   Extremities: + peripheral edema.  Neurologic: Alert to self  Skin: No lesions        Basic Metabolic Panel: Recent Labs  Lab 04/24/19 0628  04/24/19 1827 04/25/19 0705 04/26/19 0429 04/27/19 0623 04/28/19 0541  NA 131*  --  129* 131*  --  133* 137  K 6.0*   < > 6.0* 5.9* 5.8* 5.0 4.1  CL 95*  --  94* 95*  --  96* 96*  CO2 25  --  22 22  --  23 28  GLUCOSE 132*  --  130* 108*  --  83 97  BUN 46*  --  52* 57*  --  80* 79*  CREATININE 1.22  --  1.44* 1.58* 1.87* 1.68* 1.54*  CALCIUM 8.8*  --  9.1 9.2  --  9.0 8.8*  PHOS  --   --   --   --   --  5.6*  --    < > = values in this interval not displayed.    Liver Function Tests: Recent Labs  Lab 04/27/19 0623  ALBUMIN 3.6   No results for input(s): LIPASE, AMYLASE in the last 168 hours. No results for input(s): AMMONIA in the last 168 hours.  CBC: Recent Labs  Lab 04/22/19 0713 04/24/19 0628 04/28/19 0541  WBC 4.2 4.7 5.1  HGB 12.5* 13.3 13.1  HCT 36.9* 39.5 38.6*  MCV  106.3* 105.3* 103.8*  PLT 170 188 206    Cardiac Enzymes: No results for input(s): CKTOTAL, CKMB, CKMBINDEX, TROPONINI in the last 168 hours.  BNP: Invalid input(s): POCBNP  CBG: Recent Labs  Lab 04/26/19 0508  GLUCAP 91    Microbiology: Results for orders placed or performed during the hospital encounter of 04/11/19  SARS CORONAVIRUS 2 (TAT 6-12 HRS) Nasal Swab Aptima Multi Swab     Status: None   Collection Time: 04/11/19  5:48 PM   Specimen: Aptima Multi Swab; Nasal Swab  Result Value Ref Range Status   SARS Coronavirus 2 NEGATIVE NEGATIVE Final    Comment: (NOTE) SARS-CoV-2 target nucleic acids are NOT DETECTED. The SARS-CoV-2 RNA is generally detectable in upper and lower respiratory specimens during the acute phase of infection. Negative results do not preclude SARS-CoV-2 infection, do not rule out co-infections with other pathogens, and should not be used as the sole basis for treatment or other patient management decisions. Negative results must be combined with clinical observations, patient history, and epidemiological information. The  expected result is Negative. Fact Sheet for Patients: SugarRoll.be Fact Sheet for Healthcare Providers: https://www.woods-mathews.com/ This test is not yet approved or cleared by the [place] FDA and  has been authorized for detection and/or diagnosis of SARS-CoV-2 by FDA under an Emergency Use Authorization (EUA). This EUA will remain  in effect (meaning this test can be used) for the duration of the COVID-19 declaration under Section 56 4(b)(1) of the Act, 21 U.S.C. section 360bbb-3(b)(1), unless the authorization is terminated or revoked sooner. Performed at [facility], [place] [address] [place], [place] [address]   Novel Coronavirus, NAA (hospital order; send-out to ref lab)     Status: None   Collection Time: 04/17/19 10:49 AM   Specimen: Nasopharyngeal Swab;  Respiratory  Result Value Ref Range Status   SARS-CoV-2, NAA NOT DETECTED NOT DETECTED Final    Comment: (NOTE) This nucleic acid amplification test was developed and its perfomance characteristics determined by [employer]. Nucleic acid amplification tests include PCR and TMA. This test has not been FDA cleared or approved. This test has been authorized by FDA under an Emergency Use Authorization (EUA). This test is only authorized for the duration of time the declaration that circumstances exist justifying the authorization of the emergency use of in vitro diagnostic tests for detection of SARS-CoV-2 virus and/or diagnosis of COVID-19 infection under section 564(b)(1) of the Act, 21 U.S.C. [id number]) (1), unless the authorization is terminated or revoked sooner. When diagnostic testing is negative, the possibility of a false negative result should be considered in the context of a patient's recent exposures and the presence of clinical signs and symptoms consistent with COVID-19. An individual without symptoms of COVID- 19 and who is not shedding SARS-CoV-2 vir [place] would expect to have a negative (not detected) result in this assay. Performed At: [facility] [address] [place], [place] [id number] Rush Farmer MD [id number]    [facility]  Final    Comment: Performed at [facility], [address] [place], [place] [address]  Urine Culture     Status: Abnormal   Collection Time: 04/21/19  1:20 PM   Specimen: Urine, Random  Result Value Ref Range Status   Specimen Description   Final    URINE, RANDOM Performed at [facility], [address] [place], [place] [address]    Special Requests   Final    NONE Performed at [facility], [place],  [address]    Culture (A)  Final    >=100,000 COLONIES/mL ESCHERICHIA COLI 50,000 COLONIES/mL ENTEROCOCCUS FAECALIS    Report Status  04/25/2019 FINAL  Final   Organism ID, Bacteria ESCHERICHIA COLI (A)  Final   Organism ID, Bacteria ENTEROCOCCUS FAECALIS (A)  Final      Susceptibility   Escherichia coli - MIC*    AMPICILLIN <=2 SENSITIVE Sensitive     CEFAZOLIN <=4 SENSITIVE Sensitive     CEFTRIAXONE <=1 SENSITIVE Sensitive     CIPROFLOXACIN <=0.25 SENSITIVE Sensitive     GENTAMICIN <=1 SENSITIVE Sensitive     IMIPENEM <=0.25 SENSITIVE Sensitive     NITROFURANTOIN <=16 SENSITIVE Sensitive     TRIMETH/SULFA <=20 SENSITIVE Sensitive     AMPICILLIN/SULBACTAM <=2 SENSITIVE Sensitive     PIP/TAZO <=4 SENSITIVE Sensitive     Extended ESBL NEGATIVE Sensitive     * >=100,000 COLONIES/mL ESCHERICHIA COLI   Enterococcus faecalis - MIC*    AMPICILLIN <=2 SENSITIVE Sensitive     LEVOFLOXACIN  2 SENSITIVE Sensitive     NITROFURANTOIN <=16 SENSITIVE Sensitive     VANCOMYCIN 2 SENSITIVE Sensitive     * 50,000 COLONIES/mL ENTEROCOCCUS FAECALIS    Coagulation Studies: No results for input(s): LABPROT, INR in the last 72 hours.  Urinalysis: No results for input(s): COLORURINE, LABSPEC, PHURINE, GLUCOSEU, HGBUR, BILIRUBINUR, KETONESUR, PROTEINUR, UROBILINOGEN, NITRITE, LEUKOCYTESUR in the last 72 hours.  Invalid input(s): APPERANCEUR    Imaging: No results found.   Medications:   . sodium chloride 250 mL (04/23/19 1754)   . apixaban  2.5 mg Oral BID  . atorvastatin  20 mg Oral q1800  . budesonide (PULMICORT) nebulizer solution  0.25 mg Nebulization BID  . carvedilol  3.125 mg Oral BID WC  . docusate sodium  100 mg Oral BID  . dorzolamide-timolol  1 drop Both Eyes BID  . feeding supplement (ENSURE ENLIVE)  237 mL Oral BID BM  . furosemide  40 mg Intravenous Q12H  . ipratropium-albuterol  3 mL Nebulization Q6H  . latanoprost  1 drop Both Eyes QHS  . levothyroxine  88 mcg Oral Q0600  . multivitamin with minerals  1 tablet Oral Daily  . polyethylene glycol  17 g Oral Daily  . sodium chloride flush  3 mL Intravenous  Q12H  . sodium zirconium cyclosilicate  10 g Oral TID   Followed by  . [START ON 04/29/2019] sodium zirconium cyclosilicate  10 g Oral Daily   sodium chloride, acetaminophen, sodium chloride flush  Assessment/ Plan:  Gabriel Delacruz is a 85 y.o. black male  with hypertension, MGUS, hypothyroidism, COPD, atrial fibrillation, who was admitted to [facility] on 04/11/2019 for acute exacerbation of systolic congestive heart failure.   1. Acute renal failure: baseline creatinine of 0.87 on 9/6. Normal GFR.  2. Hyponatremia 3. Hyperkalemia 4. Acute exacerbation of systolic congestive heart failure EF 25-30% 5. Hypertension  - Potassium binders - furosemide IV   LOS: 17 Tysin Salada 9/12/202010:28 AM

## 2019-04-28 NOTE — Progress Notes (Signed)
I updated patient's son, [name], via the phone. All questions answered.  Hyman Bible, MD

## 2019-04-28 NOTE — Plan of Care (Signed)
  Problem: Education: Goal: Knowledge of General Education information will improve Description: Including pain rating scale, medication(s)/side effects and non-pharmacologic comfort measures Outcome: Progressing   Problem: Clinical Measurements: Goal: Ability to maintain clinical measurements within normal limits will improve Outcome: Progressing Goal: Respiratory complications will improve Outcome: Progressing   Problem: Education: Goal: Ability to demonstrate management of disease process will improve Outcome: Progressing

## 2019-04-28 NOTE — Progress Notes (Signed)
Notify Dr. Brett Albino about patient still lethargic/somnolent, drifting back to sleep during conversation. Unable to take p.o meds until more alert, will hold coreg since BP is soft 96/63 per hold parameter. MD made aware.

## 2019-04-29 LAB — BASIC METABOLIC PANEL
Anion gap: 12 (ref 5–15)
BUN: 67 mg/dL — ABNORMAL HIGH (ref 8–23)
CO2: 32 mmol/L (ref 22–32)
Calcium: 8.8 mg/dL — ABNORMAL LOW (ref 8.9–10.3)
Chloride: 96 mmol/L — ABNORMAL LOW (ref 98–111)
Creatinine, Ser: 1.38 mg/dL — ABNORMAL HIGH (ref 0.61–1.24)
GFR calc Af Amer: 52 mL/min — ABNORMAL LOW (ref >60)
GFR calc non Af Amer: 45 mL/min — ABNORMAL LOW (ref >60)
Glucose, Bld: 94 mg/dL (ref 70–99)
Potassium: 3.3 mmol/L — ABNORMAL LOW (ref 3.5–5.1)
Sodium: 140 mmol/L (ref 135–145)

## 2019-04-29 MED ORDER — POTASSIUM CHLORIDE CRYS ER 20 MEQ PO TBCR: 40.0000 meq | EXTENDED_RELEASE_TABLET | Freq: Once | ORAL | Status: DC

## 2019-04-29 MED ORDER — ENOXAPARIN SODIUM 80 MG/0.8ML ~~LOC~~ SOLN
70.0000 mg | Freq: Two times a day (BID) | SUBCUTANEOUS | Status: DC
  Administered 2019-04-29 – 2019-05-01 (×4): 70 mg via SUBCUTANEOUS
  Filled 2019-04-29 (×4): qty 0.8

## 2019-04-29 MED ORDER — POTASSIUM CHLORIDE 10 MEQ/100ML IV SOLN
10.0000 meq | INTRAVENOUS | Status: AC
  Administered 2019-04-29 (×2): 10 meq via INTRAVENOUS
  Filled 2019-04-29 (×2): qty 100

## 2019-04-29 NOTE — Plan of Care (Signed)
  Problem: Education: Goal: Knowledge of General Education information will improve Description: Including pain rating scale, medication(s)/side effects and non-pharmacologic comfort measures Outcome: Progressing   Problem: Clinical Measurements: Goal: Respiratory complications will improve Outcome: Progressing Goal: Cardiovascular complication will be avoided Outcome: Progressing   

## 2019-04-29 NOTE — Progress Notes (Addendum)
[place] at [facility]                                                                                                                                                                                Patient Demographics   Gabriel Delacruz, is a 85 y.o. male, DOB - [birth year], [id number]  Admit date - 04/11/2019   Admitting Physician Christel Mormon, MD  Outpatient Primary MD for the patient is Marinda Elk, MD   LOS - 18  Subjective: Patient has no concerns this morning.  He denies any shortness of breath or chest pain.  Per sons at bedside, patient continues to improve every day.  He is a little bit more alert today.  Review of Systems:   CONSTITUTIONAL: Unable to provide due to confusion Vitals:   Vitals:   04/29/19 0500 04/29/19 0511 04/29/19 0802 04/29/19 0828  BP:  117/67 110/63   Pulse:  71 70 70  Resp:      Temp:  97.6 F (36.4 C) 97.6 F (36.4 C)   TempSrc:  Oral Oral   SpO2:  100%  100%  Weight: 72.8 kg     Height:        Wt Readings from Last 3 Encounters:  04/29/19 72.8 kg  10/19/18 80.3 kg  07/04/18 77.3 kg     Intake/Output Summary (Last 24 hours) at 04/29/2019 1316 Last data filed at 04/29/2019 0844 Gross per 24 hour  Intake 3 ml  Output 0 ml  Net 3 ml    Physical Exam:   GENERAL: Pleasant-appearing in no apparent distress.  Sleepy, but easily arousable. HEENT: Atraumatic, normocephalic. Extraocular muscles are intact. Pupils equal and reactive to light. Sclerae anicteric. No conjunctival injection. No oro-pharyngeal erythema.  NECK: Supple. There is no jugular venous distention. No bruits, no lymphadenopathy, no thyromegaly.  HEART: Regular rate and rhythm,.  Positive murmurs, no rubs, no clicks.  LUNGS: + Diminished breath sounds in the lung bases bilaterally. No rales or rhonchi. No wheezes.  ABDOMEN: Soft, flat, nontender, nondistended. Has good bowel sounds. No hepatosplenomegaly appreciated.  EXTREMITIES: No  evidence of any cyanosis, clubbing. 2+ pitting edema to the low shin bilaterally, improved from yesterday. NEUROLOGIC: Alert, able to move all extremities. SKIN: Moist and warm with no rashes appreciated.  Psych: Alert and oriented x 1. Calm.   Antibiotics   Anti-infectives (From admission, onward)   Start     Dose/Rate Route Frequency Ordered Stop   04/24/19 1400  cephALEXin (KEFLEX) capsule 500 mg  Status:  Discontinued     500 mg Oral Every 8 hours 04/24/19 0947 04/25/19 1423   04/21/19 1600  cefTRIAXone (ROCEPHIN) 1  g in sodium chloride 0.9 % 100 mL IVPB  Status:  Discontinued     1 g 200 mL/hr over 30 Minutes Intravenous Every 24 hours 04/21/19 1454 04/24/19 0947      Medications   Scheduled Meds: . atorvastatin  20 mg Oral q1800  . budesonide (PULMICORT) nebulizer solution  0.25 mg Nebulization BID  . carvedilol  3.125 mg Oral BID WC  . docusate sodium  100 mg Oral BID  . dorzolamide-timolol  1 drop Both Eyes BID  . enoxaparin (LOVENOX) injection  70 mg Subcutaneous BID  . feeding supplement (ENSURE ENLIVE)  237 mL Oral BID BM  . furosemide  40 mg Intravenous Q12H  . ipratropium-albuterol  3 mL Nebulization Q6H  . latanoprost  1 drop Both Eyes QHS  . levothyroxine  88 mcg Oral Q0600  . multivitamin with minerals  1 tablet Oral Daily  . polyethylene glycol  17 g Oral Daily  . sodium chloride flush  3 mL Intravenous Q12H   Continuous Infusions: . sodium chloride 250 mL (04/23/19 1754)   PRN Meds:.sodium chloride, acetaminophen, sodium chloride flush   Data Review:   Micro Results Recent Results (from the past 240 hour(s))  Urine Culture     Status: Abnormal   Collection Time: 04/21/19  1:20 PM   Specimen: Urine, Random  Result Value Ref Range Status   Specimen Description   Final    URINE, RANDOM Performed at [facility], [address] [place], [place] [id number]    Special Requests   Final    NONE Performed at [facility], [address] [place], [place] [address]    Culture (A)  Final    >=100,000 COLONIES/mL ESCHERICHIA COLI 50,000 COLONIES/mL ENTEROCOCCUS FAECALIS    Report Status 04/25/2019 FINAL  Final   Organism ID, Bacteria ESCHERICHIA COLI (A)  Final   Organism ID, Bacteria ENTEROCOCCUS FAECALIS (A)  Final      Susceptibility   Escherichia coli - MIC*    AMPICILLIN <=2 SENSITIVE Sensitive     CEFAZOLIN <=4 SENSITIVE Sensitive     CEFTRIAXONE <=1 SENSITIVE Sensitive     CIPROFLOXACIN <=0.25 SENSITIVE Sensitive     GENTAMICIN <=1 SENSITIVE Sensitive     IMIPENEM <=0.25 SENSITIVE Sensitive     NITROFURANTOIN <=16 SENSITIVE Sensitive     TRIMETH/SULFA <=20 SENSITIVE Sensitive     AMPICILLIN/SULBACTAM <=2 SENSITIVE Sensitive     PIP/TAZO <=4 SENSITIVE Sensitive     Extended ESBL NEGATIVE Sensitive     * >=100,000 COLONIES/mL ESCHERICHIA COLI   Enterococcus faecalis - MIC*    AMPICILLIN <=2 SENSITIVE Sensitive     LEVOFLOXACIN 2 SENSITIVE Sensitive     NITROFURANTOIN <=16 SENSITIVE Sensitive     VANCOMYCIN 2 SENSITIVE Sensitive     * 50,000 COLONIES/mL ENTEROCOCCUS FAECALIS    Radiology Reports Ct Head Wo Contrast  Result Date: 04/20/2019 CLINICAL DATA:  Altered mental status. EXAM: CT HEAD WITHOUT CONTRAST TECHNIQUE: Contiguous axial images were obtained from the base of the skull through the vertex without intravenous contrast. COMPARISON:  None FINDINGS: Brain: Hypoattenuation in the left frontal operculum and involving the anterior insular ribbon is likely remote with some evidence for volume loss occluding ex vacuo dilation of the left lateral ventricle. Moderate diffuse atrophy and white matter disease is present otherwise. Basal ganglia are intact. The remainder of the left insular ribbon and the entire right insular ribbon are intact. The brainstem and cerebellum are within normal limits. No significant  extraaxial fluid collection is present. Vascular: Diffuse vascular calcifications are present. There  are calcifications in the distal M1 segments bilaterally. Dense calcifications are present in the cavernous internal carotid arteries and at the dural margin of both vertebral arteries. Skull: Calvarium is intact. No focal lytic or blastic lesions are present. Sinuses/Orbits: The paranasal sinuses and mastoid air cells are clear. Bilateral lens replacements are noted. Globes and orbits are otherwise unremarkable. IMPRESSION: 1. 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 2. Atrophy and white matter disease without other focal infarct. 3. Atherosclerosis Electronically Signed   By: San Morelle M.D.   On: 04/20/2019 18:15   US Carotid Bilateral  Result Date: 04/22/2019 CLINICAL DATA:  Dizziness. History of CAD, hypertension, hyperlipidemia and smoking. EXAM: BILATERAL CAROTID DUPLEX ULTRASOUND TECHNIQUE: Pearline Cables scale imaging, color Doppler and duplex ultrasound were performed of bilateral carotid and vertebral arteries in the neck. COMPARISON:  None. FINDINGS: Criteria: Quantification of carotid stenosis is based on velocity parameters that correlate the residual internal carotid diameter with NASCET-based stenosis levels, using the diameter of the distal internal carotid lumen as the denominator for stenosis measurement. The following velocity measurements were obtained: RIGHT ICA: 86/16 cm/sec CCA: [id number] cm/sec SYSTOLIC ICA/CCA RATIO:  1.8 ECA: 24 cm/sec LEFT ICA: 79/18 cm/sec CCA: [id number] cm/sec SYSTOLIC ICA/CCA RATIO:  2.2 ECA: 84 cm/sec RIGHT CAROTID ARTERY: There is a minimal amount of intimal thickening/atherosclerotic plaque seen throughout the right common carotid artery (images 3, 7 and 11). There is a moderate amount of eccentric echogenic partially shadowing plaque within the right carotid bulb (image 15), extending to involve the origin and proximal aspects of the right  internal carotid artery (image 23), not resulting in elevated peak systolic velocities within the interrogated course of the right internal carotid artery to suggest a hemodynamically significant stenosis. RIGHT VERTEBRAL ARTERY:  Antegrade flow LEFT CAROTID ARTERY: There is a minimal amount of intimal thickening seen throughout the left common carotid artery (representative image 44). There is a moderate amount of eccentric echogenic partially shadowing plaque within the left carotid bulb (images 47 and 48), extending to involve the origin and proximal aspects of the left internal carotid artery (image 55), not resulting in elevated peak systolic velocities within the interrogated course of the left internal carotid artery to suggest a hemodynamically significant stenosis. LEFT VERTEBRAL ARTERY:  Antegrade flow Note is made of a cardiac arrhythmia (representative image 50). IMPRESSION: 1. Moderate amount of bilateral atherosclerotic plaque, right subjectively greater than left, not resulting in a hemodynamically significant stenosis within either internal artery. 2. Incidental made of a cardiac arrhythmia. Further evaluation with ECG monitoring could be performed as indicated Electronically Signed   By: Sandi Mariscal M.D.   On: 04/22/2019 08:25   Dg Chest Port 1 View  Result Date: 04/25/2019 CLINICAL DATA:  Initial evaluation for acute CHF. EXAM: PORTABLE CHEST 1 VIEW COMPARISON:  Prior radiograph from 04/20/2019. FINDINGS: Moderate cardiomegaly, stable. Mediastinal silhouette within normal limits. Lung volumes are reduced. Veiling opacities overlying the bilateral hemidiaphragms most consistent with pleural effusions. Perihilar vascular congestion with mild interstitial prominence without frank pulmonary edema. Superimposed bibasilar opacities likely reflect atelectasis and/or effusions. Infiltrates would be difficult to exclude, and could be considered in the correct clinical setting. No pneumothorax. No acute  osseous finding. Degenerative changes noted about the shoulders bilaterally, right greater than left. IMPRESSION: 1. Veiling opacities overlying the bilateral hemidiaphragms,  most consistent with pleural effusions. Superimposed bibasilar opacities likely reflect atelectasis and/or effusions. Infiltrates would be difficult to exclude, and could be considered in the correct clinical setting. 2. Cardiomegaly with perihilar vascular congestion and interstitial prominence without frank pulmonary edema. Electronically Signed   By: Jeannine Boga M.D.   On: 04/25/2019 00:59   Dg Chest Port 1 View  Result Date: 04/20/2019 CLINICAL DATA:  Cough. EXAM: PORTABLE CHEST 1 VIEW COMPARISON:  Radiograph April 11, 2019. FINDINGS: Stable cardiomegaly. No pneumothorax is noted. Atherosclerosis of thoracic aorta is noted. Mild bibasilar atelectasis or infiltrates are noted with small pleural effusions. Bony thorax is unremarkable. IMPRESSION: Mild bibasilar subsegmental atelectasis or infiltrates are noted with associated small pleural effusions. Aortic Atherosclerosis (ICD10-I70.0). Electronically Signed   By: Marijo Conception M.D.   On: 04/20/2019 16:41   Dg Chest Port 1 View  Result Date: 04/11/2019 CLINICAL DATA:  Leg and ankle swelling, low blood pressure EXAM: PORTABLE CHEST 1 VIEW COMPARISON:  10/13/2018 FINDINGS: Gross cardiomegaly. Subtle heterogeneous opacity of the right lung base with a probable small layering pleural effusion. The visualized skeletal structures are unremarkable. IMPRESSION: Gross cardiomegaly. Subtle heterogeneous opacity of the right lung base with a probable small layering pleural effusion, concerning for infection or aspiration. Electronically Signed   By: Eddie Candle M.D.   On: 04/11/2019 16:59     CBC Recent Labs  Lab 04/24/19 0628 04/28/19 0541  WBC 4.7 5.1  HGB 13.3 13.1  HCT 39.5 38.6*  PLT 188 206  MCV 105.3* 103.8*  MCH 35.5* 35.2*  MCHC 33.7 33.9  RDW 12.6 13.3     Chemistries  Recent Labs  Lab 04/24/19 1827 04/25/19 0705 04/26/19 0429 04/27/19 0623 04/28/19 0541 04/29/19 0551  NA 129* 131*  --  133* 137 140  K 6.0* 5.9* 5.8* 5.0 4.1 3.3*  CL 94* 95*  --  96* 96* 96*  CO2 22 22  --  23 28 32  GLUCOSE 130* 108*  --  83 97 94  BUN 52* 57*  --  80* 79* 67*  CREATININE 1.44* 1.58* 1.87* 1.68* 1.54* 1.38*  CALCIUM 9.1 9.2  --  9.0 8.8* 8.8*   ------------------------------------------------------------------------------------------------------------------ estimated creatinine clearance is 32.7 mL/min (A) (by C-G formula based on SCr of 1.38 mg/dL (H)). ------------------------------------------------------------------------------------------------------------------ No results for input(s): HGBA1C in the last 72 hours. ------------------------------------------------------------------------------------------------------------------ No results for input(s): CHOL, HDL, LDLCALC, TRIG, CHOLHDL, LDLDIRECT in the last 72 hours. ------------------------------------------------------------------------------------------------------------------ No results for input(s): TSH, T4TOTAL, T3FREE, THYROIDAB in the last 72 hours.  Invalid input(s): FREET3 ------------------------------------------------------------------------------------------------------------------ No results for input(s): VITAMINB12, FOLATE, FERRITIN, TIBC, IRON, RETICCTPCT in the last 72 hours.  Coagulation profile No results for input(s): INR, PROTIME in the last 168 hours.  No results for input(s): DDIMER in the last 72 hours.  Cardiac Enzymes No results for input(s): CKMB, TROPONINI, MYOGLOBIN in the last 168 hours.  Invalid input(s): CK ------------------------------------------------------------------------------------------------------------------ Invalid input(s): POCBNP    Assessment & Plan   Acute systolic CHF- new diagnosis. LE edema is improving. -ECHO 8/27 with EF  25-30% -Continue IV lasix 40mg  bid -Continue coreg -No ACE due to renal insufficiency -Nephrology following -Palliative care following- family would like for patient to go to SNF -Awaiting SNF placement  Severe tricuspid and mitral regurgitation- seen on ECHO -Patient is not a candidate for aggressive intervention -Treatment as above  Acute renal failure- may be due to cardiorenal syndrome. Creatinine has been improving. -Avoid nephrotoxic agents -Monitor  Hypokalemia- due to lasix -Replete  and recheck  Chronic atrial fibrillation- pacemaker in place. -Continue Coreg and Eliquis  Subacute/chronic left frontal subcortical stroke -Seen on CT head this admission -Unable to obtain MRI due to pacemaker -Continue eliquis -PT recommending SNF  COPD- stable, no signs of acute exacerbation -Continue home inhalers  DVT prophylaxis- Eliquis  Sons at bedside, updated on the plan.      Code Status Orders  (From admission, onward)         Start     Ordered   04/11/19 2145  Full code  Continuous     04/11/19 2144        Code Status History    Date Active Date Inactive Code Status Order ID Comments User Context   10/13/2018 2302 10/19/2018 2134 Full Code [id number]  Demetrios Loll, MD Inpatient   Advance Care Planning Activity    Advance Directive Documentation     Most Recent Value  Type of Advance Directive  Healthcare Power of [place]  Pre-existing out of facility DNR order (yellow form or pink MOST form)  -  "MOST" Form in Place?  -     Consults nephrology  DVT Prophylaxis  eliquis  Lab Results  Component Value Date   PLT 206 04/28/2019     Time Spent in minutes   62min Greater than 50% of time spent in care coordination and counseling patient regarding the condition and plan of care.   Berna Spare Arline Ketter M.D on 04/29/2019 at 1:16 PM  Between 7am to 6pm - Pager - [phone]  After 6pm go to [email or web address] - Proofreader  [employer]   Office   [phone]

## 2019-04-29 NOTE — Consult Note (Addendum)
[place] for apixaban Indication: atrial fibrillation  No Known Allergies  Patient Measurements: Height: 5\' 6"  (167.6 cm) Weight: 160 lb 6.4 oz (72.8 kg) IBW/kg (Calculated) : 63.8  Vital Signs: Temp: 97.6 F (36.4 C) (09/13 0802) Temp Source: Oral (09/13 0802) BP: 110/63 (09/13 0802) Pulse Rate: 70 (09/13 0828)  Labs: Recent Labs    04/27/19 0623 04/28/19 0541 04/29/19 0551  HGB  --  13.1  --   HCT  --  38.6*  --   PLT  --  206  --   CREATININE 1.68* 1.54* 1.38*    Estimated Creatinine Clearance: 32.7 mL/min (A) (by C-G formula based on SCr of 1.38 mg/dL (H)).   Medical History: Past Medical History:  Diagnosis Date  . Anemia   . Atrial fibrillation ([place])   . COPD (chronic obstructive pulmonary disease) ([place])   . GERD (gastroesophageal reflux disease)   . Glaucoma   . Hyperlipemia   . Hypertension   . Hypothyroidism   . Irregular heart rhythm   . Left anterior fascicular block   . MGUS (monoclonal gammopathy of unknown significance)   . RBBB     Medications:  Medications Prior to Admission  Medication Sig Dispense Refill Last Dose  . albuterol (PROAIR HFA) 108 (90 BASE) MCG/ACT inhaler Inhale 2 puffs into the lungs every 6 (six) hours as needed.    prn at prn  . dorzolamide-timolol (COSOPT) 22.3-6.8 MG/ML ophthalmic solution Place 1 drop into both eyes 2 (two) times daily.    04/11/2019 at Unknown time  . Fluticasone-Salmeterol (ADVAIR DISKUS) 250-50 MCG/DOSE AEPB Inhale 1 puff into the lungs daily.    04/11/2019 at Unknown time  . furosemide (LASIX) 40 MG tablet Take 1 tablet (40 mg total) by mouth daily. 30 tablet 0 04/11/2019 at 1100  . levothyroxine (SYNTHROID) 75 MCG tablet Take 75 mcg by mouth daily before breakfast.   04/11/2019 at Unknown time  . multivitamin-iron-minerals-folic acid (CENTRUM) chewable tablet Chew 1 tablet by mouth daily.    04/11/2019 at 1100  . potassium chloride (K-DUR) 10 MEQ tablet Take 10 mEq  by mouth daily.    04/11/2019 at 1100  . Travoprost, BAK Free, (TRAVATAN Z) 0.004 % SOLN ophthalmic solution PLACE 1 DROP INTO BOTH EYES ONCE DAILY   04/10/2019 at Unknown time  . warfarin (COUMADIN) 6 MG tablet Take 6.5 mg by mouth daily at 6 PM.    04/10/2019 at 1800   Scheduled:  . apixaban  2.5 mg Oral BID  . atorvastatin  20 mg Oral q1800  . budesonide (PULMICORT) nebulizer solution  0.25 mg Nebulization BID  . carvedilol  3.125 mg Oral BID WC  . docusate sodium  100 mg Oral BID  . dorzolamide-timolol  1 drop Both Eyes BID  . feeding supplement (ENSURE ENLIVE)  237 mL Oral BID BM  . furosemide  40 mg Intravenous Q12H  . ipratropium-albuterol  3 mL Nebulization Q6H  . latanoprost  1 drop Both Eyes QHS  . levothyroxine  88 mcg Oral Q0600  . multivitamin with minerals  1 tablet Oral Daily  . polyethylene glycol  17 g Oral Daily  . sodium chloride flush  3 mL Intravenous Q12H   Infusions:  . sodium chloride 250 mL (04/23/19 1754)  . potassium chloride 10 mEq (04/29/19 0947)   PRN: sodium chloride, acetaminophen, sodium chloride flush Anti-infectives (From admission, onward)   Start     Dose/Rate Route Frequency Ordered Stop   04/24/19  1400  cephALEXin (KEFLEX) capsule 500 mg  Status:  Discontinued     500 mg Oral Every 8 hours 04/24/19 0947 04/25/19 1423   04/21/19 1600  cefTRIAXone (ROCEPHIN) 1 g in sodium chloride 0.9 % 100 mL IVPB  Status:  Discontinued     1 g 200 mL/hr over 30 Minutes Intravenous Every 24 hours 04/21/19 1454 04/24/19 0947      Assessment: Pharmacy consulted to dose apixaban for afib. Pt has been on apixaban 2.5 mg BID since dose reduced 04/26/2019.   Scr in flux, will monitor daily for need of dose adjustment.  Goal of Therapy:  Monitor platelets by anticoagulation protocol: Yes   Plan:  Patient is lethargic and unable to take oral medications. Per conversation with MD, will transition patient to Enoxaparin 1mg /kg Q12hr until patient able to tolerate  oral medication.   Pharmacy will continue to monitor and adjust per consult.   Simpson,Michael L, RPh 04/29/2019 10:26 AM

## 2019-04-30 LAB — BASIC METABOLIC PANEL
Anion gap: 10 (ref 5–15)
BUN: 52 mg/dL — ABNORMAL HIGH (ref 8–23)
CO2: 34 mmol/L — ABNORMAL HIGH (ref 22–32)
Calcium: 8.6 mg/dL — ABNORMAL LOW (ref 8.9–10.3)
Chloride: 98 mmol/L (ref 98–111)
Creatinine, Ser: 1.04 mg/dL (ref 0.61–1.24)
GFR calc Af Amer: >60 mL/min (ref >60)
GFR calc non Af Amer: >60 mL/min (ref >60)
Glucose, Bld: 78 mg/dL (ref 70–99)
Potassium: 3.2 mmol/L — ABNORMAL LOW (ref 3.5–5.1)
Sodium: 142 mmol/L (ref 135–145)

## 2019-04-30 LAB — MAGNESIUM: Magnesium: 2.5 mg/dL — ABNORMAL HIGH (ref 1.7–2.4)

## 2019-04-30 MED ORDER — POTASSIUM CHLORIDE 10 MEQ/100ML IV SOLN
10.0000 meq | INTRAVENOUS | Status: AC
  Administered 2019-04-30 (×3): 10 meq via INTRAVENOUS
  Filled 2019-04-30 (×5): qty 100

## 2019-04-30 NOTE — Care Management Important Message (Signed)
Important Message  Patient Details  Name: Gabriel Delacruz MRN: [id number] Date of Birth: 1928-08-12   Medicare Important Message Given:  Yes     [name] 04/30/2019, 11:34 AM

## 2019-04-30 NOTE — Progress Notes (Addendum)
[place] [facility]                                                                                                                                                                                Patient Demographics   Gabriel Delacruz, is a 85 y.o. male, DOB - 1928/01/07, [id number]  Admit date - 04/11/2019   Admitting Physician Christel Mormon, MD  Outpatient Primary MD for the patient is Marinda Elk, MD   LOS - 19  Subjective: Patient continues to be sleepy for some the day.  He is easily arousable and when he wakes up, he answers questions appropriately.  He has no concerns this morning.  Sons feel like he is slowly improving.  Patient's 2 sons state that he was very alert around lunchtime and ate a very good lunch.  He was awake and talking with them.  Review of Systems:   CONSTITUTIONAL: Unable to provide due to confusion Vitals:   Vitals:   04/29/19 2058 04/30/19 0340 04/30/19 0824 04/30/19 0831  BP:  (!) 97/56 104/70   Pulse:  72 76   Resp:  16 19   Temp:  97.7 F (36.5 C) 98.3 F (36.8 C)   TempSrc:   Oral   SpO2: 100% 94% 92% 92%  Weight:  72.2 kg    Height:        Wt Readings from Last 3 Encounters:  04/30/19 72.2 kg  10/19/18 80.3 kg  07/04/18 77.3 kg     Intake/Output Summary (Last 24 hours) at 04/30/2019 1358 Last data filed at 04/29/2019 1421 Gross per 24 hour  Intake -  Output 0 ml  Net 0 ml    Physical Exam:   GENERAL: Pleasant-appearing in no apparent distress.  Sleepy, but easily arousable. HEENT: Atraumatic, normocephalic. Extraocular muscles are intact. Pupils equal and reactive to light. Sclerae anicteric. No conjunctival injection. No oro-pharyngeal erythema.  NECK: Supple. There is no jugular venous distention. No bruits, no lymphadenopathy, no thyromegaly.  HEART: Regular rate and rhythm,.  Positive murmurs, no rubs, no clicks.  LUNGS: + Diminished breath sounds in the lung bases bilaterally. No rales or  rhonchi. No wheezes.  ABDOMEN: Soft, flat, nontender, nondistended. Has good bowel sounds. No hepatosplenomegaly appreciated.  EXTREMITIES: No evidence of any cyanosis, clubbing. 2+ pitting edema to the low shin bilaterally. NEUROLOGIC: Alert, able to move all extremities. SKIN: Moist and warm with no rashes appreciated.  Psych: Alert and oriented x 1. Calm.   Antibiotics   Anti-infectives (From admission, onward)   Start     Dose/Rate Route Frequency Ordered Stop   04/24/19 1400  cephALEXin (KEFLEX) capsule 500 mg  Status:  Discontinued     500 mg Oral Every 8 hours 04/24/19 0947 04/25/19 1423   04/21/19 1600  cefTRIAXone (ROCEPHIN) 1 g in sodium chloride 0.9 % 100 mL IVPB  Status:  Discontinued     1 g 200 mL/hr over 30 Minutes Intravenous Every 24 hours 04/21/19 1454 04/24/19 0947      Medications   Scheduled Meds: . atorvastatin  20 mg Oral q1800  . budesonide (PULMICORT) nebulizer solution  0.25 mg Nebulization BID  . carvedilol  3.125 mg Oral BID WC  . docusate sodium  100 mg Oral BID  . dorzolamide-timolol  1 drop Both Eyes BID  . enoxaparin (LOVENOX) injection  70 mg Subcutaneous BID  . feeding supplement (ENSURE ENLIVE)  237 mL Oral BID BM  . furosemide  40 mg Intravenous Q12H  . ipratropium-albuterol  3 mL Nebulization Q6H  . latanoprost  1 drop Both Eyes QHS  . levothyroxine  88 mcg Oral Q0600  . multivitamin with minerals  1 tablet Oral Daily  . polyethylene glycol  17 g Oral Daily  . sodium chloride flush  3 mL Intravenous Q12H   Continuous Infusions: . sodium chloride 250 mL (04/30/19 0934)   PRN Meds:.sodium chloride, acetaminophen, sodium chloride flush   Data Review:   Micro Results Recent Results (from the past 240 hour(s))  Urine Culture     Status: Abnormal   Collection Time: 04/21/19  1:20 PM   Specimen: Urine, Random  Result Value Ref Range Status   Specimen Description   Final    URINE, RANDOM Performed at [facility], [address] [place], [place] [address]    Special Requests   Final    NONE Performed at [facility], [address] [place], [place] [address]    Culture (A)  Final    >=100,000 COLONIES/mL ESCHERICHIA COLI 50,000 COLONIES/mL ENTEROCOCCUS FAECALIS    Report Status 04/25/2019 FINAL  Final   Organism ID, Bacteria ESCHERICHIA COLI (A)  Final   Organism ID, Bacteria ENTEROCOCCUS FAECALIS (A)  Final      Susceptibility   Escherichia coli - MIC*    AMPICILLIN <=2 SENSITIVE Sensitive     CEFAZOLIN <=4 SENSITIVE Sensitive     CEFTRIAXONE <=1 SENSITIVE Sensitive     CIPROFLOXACIN <=0.25 SENSITIVE Sensitive     GENTAMICIN <=1 SENSITIVE Sensitive     IMIPENEM <=0.25 SENSITIVE Sensitive     NITROFURANTOIN <=16 SENSITIVE Sensitive     TRIMETH/SULFA <=20 SENSITIVE Sensitive     AMPICILLIN/SULBACTAM <=2 SENSITIVE Sensitive     PIP/TAZO <=4 SENSITIVE Sensitive     Extended ESBL NEGATIVE Sensitive     * >=100,000 COLONIES/mL ESCHERICHIA COLI   Enterococcus faecalis - MIC*    AMPICILLIN <=2 SENSITIVE Sensitive     LEVOFLOXACIN 2 SENSITIVE Sensitive     NITROFURANTOIN <=16 SENSITIVE Sensitive     VANCOMYCIN 2 SENSITIVE Sensitive     * 50,000 COLONIES/mL ENTEROCOCCUS FAECALIS    Radiology Reports Ct Head Wo Contrast  Result Date: 04/20/2019 CLINICAL DATA:  Altered mental status. EXAM: CT HEAD WITHOUT CONTRAST TECHNIQUE: Contiguous axial images were obtained from the base of the skull through the vertex without intravenous contrast. COMPARISON:  None FINDINGS: Brain: Hypoattenuation in the left frontal operculum and involving the anterior insular ribbon is likely remote with some evidence for volume loss occluding ex vacuo dilation of the left lateral ventricle. Moderate diffuse atrophy and white matter disease is present otherwise. Basal ganglia are intact.  The remainder of the left insular ribbon and the entire right insular ribbon are intact. The brainstem and cerebellum are within normal  limits. No significant extraaxial fluid collection is present. Vascular: Diffuse vascular calcifications are present. There are calcifications in the distal M1 segments bilaterally. Dense calcifications are present in the cavernous internal carotid arteries and at the dural margin of both vertebral arteries. Skull: Calvarium is intact. No focal lytic or blastic lesions are present. Sinuses/Orbits: The paranasal sinuses and mastoid air cells are clear. Bilateral lens replacements are noted. Globes and orbits are otherwise unremarkable. IMPRESSION: 1. 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 2. Atrophy and white matter disease without other focal infarct. 3. Atherosclerosis Electronically Signed   By: San Morelle M.D.   On: 04/20/2019 18:15   US Carotid Bilateral  Result Date: 04/22/2019 CLINICAL DATA:  Dizziness. History of CAD, hypertension, hyperlipidemia and smoking. EXAM: BILATERAL CAROTID DUPLEX ULTRASOUND TECHNIQUE: Pearline Cables scale imaging, color Doppler and duplex ultrasound were performed of bilateral carotid and vertebral arteries in the neck. COMPARISON:  None. FINDINGS: Criteria: Quantification of carotid stenosis is based on velocity parameters that correlate the residual internal carotid diameter with NASCET-based stenosis levels, using the diameter of the distal internal carotid lumen as the denominator for stenosis measurement. The following velocity measurements were obtained: RIGHT ICA: 86/16 cm/sec CCA: [id number] cm/sec SYSTOLIC ICA/CCA RATIO:  1.8 ECA: 24 cm/sec LEFT ICA: 79/18 cm/sec CCA: [id number] cm/sec SYSTOLIC ICA/CCA RATIO:  2.2 ECA: 84 cm/sec RIGHT CAROTID ARTERY: There is a minimal amount of intimal thickening/atherosclerotic plaque seen throughout the right common carotid artery (images 3, 7 and 11). There is a moderate amount of eccentric echogenic partially  shadowing plaque within the right carotid bulb (image 15), extending to involve the origin and proximal aspects of the right internal carotid artery (image 23), not resulting in elevated peak systolic velocities within the interrogated course of the right internal carotid artery to suggest a hemodynamically significant stenosis. RIGHT VERTEBRAL ARTERY:  Antegrade flow LEFT CAROTID ARTERY: There is a minimal amount of intimal thickening seen throughout the left common carotid artery (representative image 44). There is a moderate amount of eccentric echogenic partially shadowing plaque within the left carotid bulb (images 47 and 48), extending to involve the origin and proximal aspects of the left internal carotid artery (image 55), not resulting in elevated peak systolic velocities within the interrogated course of the left internal carotid artery to suggest a hemodynamically significant stenosis. LEFT VERTEBRAL ARTERY:  Antegrade flow Note is made of a cardiac arrhythmia (representative image 50). IMPRESSION: 1. Moderate amount of bilateral atherosclerotic plaque, right subjectively greater than left, not resulting in a hemodynamically significant stenosis within either internal artery. 2. Incidental made of a cardiac arrhythmia. Further evaluation with ECG monitoring could be performed as indicated Electronically Signed   By: Sandi Mariscal M.D.   On: 04/22/2019 08:25   Dg Chest Port 1 View  Result Date: 04/25/2019 CLINICAL DATA:  Initial evaluation for acute CHF. EXAM: PORTABLE CHEST 1 VIEW COMPARISON:  Prior radiograph from 04/20/2019. FINDINGS: Moderate cardiomegaly, stable. Mediastinal silhouette within normal limits. Lung volumes are reduced. Veiling opacities overlying the bilateral hemidiaphragms most consistent with pleural effusions. Perihilar vascular congestion with mild interstitial prominence without frank pulmonary edema. Superimposed bibasilar opacities likely reflect atelectasis and/or effusions.  Infiltrates would be difficult to exclude, and could be considered in the correct clinical setting.  No pneumothorax. No acute osseous finding. Degenerative changes noted about the shoulders bilaterally, right greater than left. IMPRESSION: 1. Veiling opacities overlying the bilateral hemidiaphragms, most consistent with pleural effusions. Superimposed bibasilar opacities likely reflect atelectasis and/or effusions. Infiltrates would be difficult to exclude, and could be considered in the correct clinical setting. 2. Cardiomegaly with perihilar vascular congestion and interstitial prominence without frank pulmonary edema. Electronically Signed   By: Jeannine Boga M.D.   On: 04/25/2019 00:59   Dg Chest Port 1 View  Result Date: 04/20/2019 CLINICAL DATA:  Cough. EXAM: PORTABLE CHEST 1 VIEW COMPARISON:  Radiograph April 11, 2019. FINDINGS: Stable cardiomegaly. No pneumothorax is noted. Atherosclerosis of thoracic aorta is noted. Mild bibasilar atelectasis or infiltrates are noted with small pleural effusions. Bony thorax is unremarkable. IMPRESSION: Mild bibasilar subsegmental atelectasis or infiltrates are noted with associated small pleural effusions. Aortic Atherosclerosis (ICD10-I70.0). Electronically Signed   By: Marijo Conception M.D.   On: 04/20/2019 16:41   Dg Chest Port 1 View  Result Date: 04/11/2019 CLINICAL DATA:  Leg and ankle swelling, low blood pressure EXAM: PORTABLE CHEST 1 VIEW COMPARISON:  10/13/2018 FINDINGS: Gross cardiomegaly. Subtle heterogeneous opacity of the right lung base with a probable small layering pleural effusion. The visualized skeletal structures are unremarkable. IMPRESSION: Gross cardiomegaly. Subtle heterogeneous opacity of the right lung base with a probable small layering pleural effusion, concerning for infection or aspiration. Electronically Signed   By: Eddie Candle M.D.   On: 04/11/2019 16:59     CBC Recent Labs  Lab 04/24/19 0628 04/28/19 0541  WBC 4.7  5.1  HGB 13.3 13.1  HCT 39.5 38.6*  PLT 188 206  MCV 105.3* 103.8*  MCH 35.5* 35.2*  MCHC 33.7 33.9  RDW 12.6 13.3    Chemistries  Recent Labs  Lab 04/25/19 0705 04/26/19 0429 04/27/19 0623 04/28/19 0541 04/29/19 0551 04/30/19 0409  NA 131*  --  133* 137 140 142  K 5.9* 5.8* 5.0 4.1 3.3* 3.2*  CL 95*  --  96* 96* 96* 98  CO2 22  --  23 28 32 34*  GLUCOSE 108*  --  83 97 94 78  BUN 57*  --  80* 79* 67* 52*  CREATININE 1.58* 1.87* 1.68* 1.54* 1.38* 1.04  CALCIUM 9.2  --  9.0 8.8* 8.8* 8.6*  MG  --   --   --   --   --  2.5*   ------------------------------------------------------------------------------------------------------------------ estimated creatinine clearance is 43.5 mL/min (by C-G formula based on SCr of 1.04 mg/dL). ------------------------------------------------------------------------------------------------------------------ No results for input(s): HGBA1C in the last 72 hours. ------------------------------------------------------------------------------------------------------------------ No results for input(s): CHOL, HDL, LDLCALC, TRIG, CHOLHDL, LDLDIRECT in the last 72 hours. ------------------------------------------------------------------------------------------------------------------ No results for input(s): TSH, T4TOTAL, T3FREE, THYROIDAB in the last 72 hours.  Invalid input(s): FREET3 ------------------------------------------------------------------------------------------------------------------ No results for input(s): VITAMINB12, FOLATE, FERRITIN, TIBC, IRON, RETICCTPCT in the last 72 hours.  Coagulation profile No results for input(s): INR, PROTIME in the last 168 hours.  No results for input(s): DDIMER in the last 72 hours.  Cardiac Enzymes No results for input(s): CKMB, TROPONINI, MYOGLOBIN in the last 168 hours.  Invalid input(s):  CK ------------------------------------------------------------------------------------------------------------------ Invalid input(s): POCBNP    Assessment & Plan   Acute systolic CHF- new diagnosis. LE edema is improving. -ECHO 8/27 with EF 25-30% -Continue IV lasix 40mg  bid -Continue coreg -No ACE due to renal insufficiency -Nephrology following -Palliative care following- family would like for patient to go to SNF -Awaiting SNF placement  Severe tricuspid  and mitral regurgitation- seen on ECHO -Patient is not a candidate for aggressive intervention -Treatment as above  Acute renal failure- may be due to cardiorenal syndrome. Resolved. -Avoid nephrotoxic agents -Monitor  Hypokalemia- due to lasix -Replete and recheck  Chronic atrial fibrillation- pacemaker in place. -Continue Coreg and Eliquis  Subacute/chronic left frontal subcortical stroke -Seen on CT head this admission -Unable to obtain MRI due to pacemaker -Continue eliquis -PT recommending SNF  COPD- stable, no signs of acute exacerbation -Continue home inhalers  DVT prophylaxis- Eliquis  Sons at bedside, updated on the plan.      Code Status Orders  (From admission, onward)         Start     Ordered   04/11/19 2145  Full code  Continuous     04/11/19 2144        Code Status History    Date Active Date Inactive Code Status Order ID Comments User Context   10/13/2018 2302 10/19/2018 2134 Full Code [id number]  Demetrios Loll, MD Inpatient   Advance Care Planning Activity    Advance Directive Documentation     Most Recent Value  Type of Advance Directive  Healthcare Power of [place]  Pre-existing out of facility DNR order (yellow form or pink MOST form)  -  "MOST" Form in Place?  -     Consults nephrology  DVT Prophylaxis  eliquis  Lab Results  Component Value Date   PLT 206 04/28/2019     Time Spent in minutes   65min Greater than 50% of time spent in care coordination and  counseling patient regarding the condition and plan of care.   Berna Spare Romelle Reiley M.D on 04/30/2019 at 1:58 PM  Between 7am to 6pm - Pager - [phone]  After 6pm go to [email or web address] - Proofreader  [employer]   Office  [phone]

## 2019-04-30 NOTE — Progress Notes (Signed)
Physical Therapy Treatment Patient Details Name: Gabriel Delacruz MRN: [id number] DOB: 1928-03-25 Today's Date: 04/30/2019    History of Present Illness 85 year old male patient with history of chronic atrial fibrillation, COPD, GERD, hyperlipidemia, hypertension, monoclonal gammopathy, hypothyroidism admitted for hypotension, leg swelling, work up showed new onset congestive heart failure    PT Comments    Pt agreeable to PT; pt does require intermittent either re awakening or gaining attention to tend to task at hand. Pt responds very well to sons. Supine to sit with Mod A and Min of another with pt demonstrating good effort especially with mobilizing LEs toward edge of bed; Mod A for trunk. Pt able to scoot B hips with cues toward edge of bed for balanced sitting and maintains seated posture with Min Guard, but need for occasional A to regain balance. Pt participates in seated balance statically and dynamically with in LE exercises. STS with rolling walker from elevated bed performed twice with Mod A and Min to Mod of another; heavy lean to R with mild correction with cues. Pt able to tolerate stand time for approximately 20 seconds before requiring sit. Pt demonstrates great initiative with stand. Sit to supine with Mod A and Min A of another; Max A of 2 to reposition upward in bed. Pt reports feeling good with change in position and work. Continue PT to progress strength, endurance, balance to improve all functional mobility and decrease level of assist.    Follow Up Recommendations  SNF     Equipment Recommendations  None recommended by PT    Recommendations for Other Services       Precautions / Restrictions Precautions Precautions: Fall Restrictions Weight Bearing Restrictions: No    Mobility  Bed Mobility Overal bed mobility: Needs Assistance Bed Mobility: Supine to Sit;Sit to Supine     Supine to sit: Mod assist Sit to supine: Mod assist   General bed mobility  comments: Min A of second for supine and sit; pt overall gives good effort in mobilizing LEs in out of bed with verbal and tactile cues. Increased assist for trunk. Once sitting able to sit statically without assist. Min guard to Min A intermittently with dynamic sit balance  Transfers Overall transfer level: Needs assistance Equipment used: Rolling walker (2 wheeled) Transfers: Sit to/from Stand Sit to Stand: Mod assist;From elevated surface;+2 physical assistance         General transfer comment: Performed twice with up to 20 second stand. Heavy lean to R with mild correction with cueing. Pt demonstrates excellent initiation especially on second stand. Difficulty attaining full uppright stand particularly with L knee extension and B hip extensionl.   Ambulation/Gait                 Stairs             Wheelchair Mobility    Modified Rankin (Stroke Patients Only)       Balance Overall balance assessment: Needs assistance Sitting-balance support: Bilateral upper extremity supported;Feet supported Sitting balance-Leahy Scale: Fair Sitting balance - Comments: Mild R lean and intermittent posterior with LE movement   Standing balance support: Bilateral upper extremity supported Standing balance-Leahy Scale: Poor Standing balance comment: R lateral lean; good effort for upright posture                            Cognition Arousal/Alertness: Lethargic(able to awaken for treatment; responds well to son's ) Behavior During Therapy: [facility] for  tasks assessed/performed Overall Cognitive Status: Within Functional Limits for tasks assessed                                        Exercises General Exercises - Lower Extremity Long Arc Quad: AROM;Both;5 reps(2 sets; greater range on R versus L) Hip Flexion/Marching: Strengthening;Both;10 reps(additional set of 5 reps) Other Exercises Other Exercises: static/dynamic sitting balance    General  Comments        Pertinent Vitals/Pain Pain Assessment: No/denies pain    Home Living                      Prior Function            PT Goals (current goals can now be found in the care plan section) Progress towards PT goals: Progressing toward goals    Frequency    Min 2X/week      PT Plan Current plan remains appropriate    Co-evaluation              AM-PAC PT "6 Clicks" Mobility   Outcome Measure  Help needed turning from your back to your side while in a flat bed without using bedrails?: A Lot Help needed moving from lying on your back to sitting on the side of a flat bed without using bedrails?: A Lot Help needed moving to and from a bed to a chair (including a wheelchair)?: Total Help needed standing up from a chair using your arms (e.g., wheelchair or bedside chair)?: A Lot Help needed to walk in hospital room?: Total Help needed climbing 3-5 steps with a railing? : Total 6 Click Score: 9    End of Session Equipment Utilized During Treatment: Gait belt Activity Tolerance: Patient tolerated treatment well Patient left: in bed;with family/visitor present;with call bell/phone within reach;Other (comment)(family will have bed set when leaving)   PT Visit Diagnosis: Other abnormalities of gait and mobility (R26.89);Muscle weakness (generalized) (M62.81);Difficulty in walking, not elsewhere classified (R26.2)     Time: [id number] PT Time Calculation (min) (ACUTE ONLY): 44 min  Charges:  $Therapeutic Exercise: 8-22 mins $Therapeutic Activity: 8-22 mins $Neuromuscular Re-education: 8-22 mins                      Larae Grooms, PTA 04/30/2019, 5:03 PM

## 2019-05-01 ENCOUNTER — Ambulatory Visit: Payer: Medicare HMO | Admitting: Family

## 2019-05-01 LAB — CBC
HCT: 42 % (ref 39.0–52.0)
Hemoglobin: 13.9 g/dL (ref 13.0–17.0)
MCH: 35.6 pg — ABNORMAL HIGH (ref 26.0–34.0)
MCHC: 33.1 g/dL (ref 30.0–36.0)
MCV: 107.7 fL — ABNORMAL HIGH (ref 80.0–100.0)
Platelets: 199 10*3/uL (ref 150–400)
RBC: 3.9 MIL/uL — ABNORMAL LOW (ref 4.22–5.81)
RDW: 13.6 % (ref 11.5–15.5)
WBC: 4.6 10*3/uL (ref 4.0–10.5)
nRBC: 0 % (ref 0.0–0.2)

## 2019-05-01 MED ORDER — APIXABAN 5 MG PO TABS
5.0000 mg | ORAL_TABLET | Freq: Two times a day (BID) | ORAL | Status: DC
  Administered 2019-05-01 – 2019-05-04 (×6): 5 mg via ORAL
  Filled 2019-05-01 (×6): qty 1

## 2019-05-01 MED ORDER — FUROSEMIDE 40 MG PO TABS
40.0000 mg | ORAL_TABLET | Freq: Every day | ORAL | Status: DC
  Administered 2019-05-02 – 2019-05-04 (×2): 40 mg via ORAL
  Filled 2019-05-01 (×3): qty 1

## 2019-05-01 NOTE — TOC Progression Note (Addendum)
Transition of Care [facility]) - Progression Note    Patient Details  Name: Coast Francia MRN: [id number] Date of Birth: 05-01-1928  Transition of [facility]) CM/SW Contact  Shade Flood, LCSW Phone Number: 05/01/2019, 10:30 AM  Clinical Narrative:     Per MD, pt medically stable for dc. Spoke with Magda Paganini at [facility]. Per Magda Paganini, she had not re-submitted authorization request to [facility] yet. PT note for late yesterday is available and she will submit it with the auth request. Will update MD when authorization is obtained so pt can be discharged.  Expected Discharge Plan: [place] Barriers to Discharge: Continued Medical Work up  Expected Discharge Plan and Services Expected Discharge Plan: [place] In-house Referral: Clinical Social Work     Living arrangements for the past 2 months: Single Family Home                                       Social Determinants of Health (SDOH) Interventions    Readmission Risk Interventions Readmission Risk Prevention Plan 04/12/2019  Medication Screening Complete  Transportation Screening Complete  Some recent data might be hidden

## 2019-05-01 NOTE — Progress Notes (Signed)
MD notified of RN holding coreg for SBP<110. I will continue to assess.

## 2019-05-01 NOTE — Progress Notes (Signed)
Level of consciousness and systolic blood pressure has been consistent for days. Patient is fatigued but was awaken while son was here feeding him.

## 2019-05-01 NOTE — Progress Notes (Signed)
Patient has been confused for multiple days and MAP is >65. Systolic blood pressure runs between 90's and 100's.

## 2019-05-01 NOTE — Progress Notes (Signed)
[place] at [facility]                                                                                                                                                                                Patient Demographics   Gabriel Delacruz, is a 85 y.o. male, DOB - [birth year], [id number]  Admit date - 04/11/2019   Admitting Physician Christel Mormon, MD  Outpatient Primary MD for the patient is Marinda Elk, MD   LOS - 20  Subjective: Patient is doing much better this morning.  He is awake, alert, and talking.  Sons are very pleased with his progress.  Apparently, patient was able to stand x2 yesterday with physical therapy.  Patient sons are looking forward to him being able to leave the hospital.  Review of Systems:   CONSTITUTIONAL:  Review of Systems  Constitutional: Negative for chills and fever.  HENT: Negative for congestion and sore throat.   Eyes: Negative for blurred vision and double vision.  Respiratory: Negative for cough and shortness of breath.   Cardiovascular: Negative for chest pain and palpitations.  Gastrointestinal: Negative for nausea and vomiting.  Genitourinary: Negative for dysuria and urgency.  Musculoskeletal: Negative for back pain and neck pain.  Neurological: Negative for dizziness and headaches.  Psychiatric/Behavioral: Negative for depression. The patient is not nervous/anxious.    Vitals:   Vitals:   05/01/19 0358 05/01/19 0423 05/01/19 0815 05/01/19 1319  BP: 99/66 97/78 94/67    Pulse: 78 73 77   Resp: 20 20 18    Temp: (!) 96.7 F (35.9 C) 97.7 F (36.5 C) (!) 97.5 F (36.4 C)   TempSrc: Axillary Oral Oral   SpO2: 91% 96%  96%  Weight: 70.6 kg     Height:        Wt Readings from Last 3 Encounters:  05/01/19 70.6 kg  10/19/18 80.3 kg  07/04/18 77.3 kg    No intake or output data in the 24 hours ending 05/01/19 1525  Physical Exam:   GENERAL: Pleasant-appearing in no apparent distress.  Awake and  alert. HEENT: Atraumatic, normocephalic. Extraocular muscles are intact. Pupils equal and reactive to light. Sclerae anicteric. No conjunctival injection. No oro-pharyngeal erythema.  NECK: Supple. There is no jugular venous distention. No bruits, no lymphadenopathy, no thyromegaly.  HEART: Regular rate and rhythm,.  Positive murmurs, no rubs, no clicks.  LUNGS: + Diminished breath sounds in the lung bases bilaterally. No rales or rhonchi. No wheezes.  ABDOMEN: Soft, flat, nontender, nondistended. Has good bowel sounds. No hepatosplenomegaly appreciated.  EXTREMITIES: No evidence of any cyanosis, clubbing.  Trace pitting edema bilaterally. NEUROLOGIC: Alert, able to move all extremities.  Talking. SKIN: Moist and warm with no rashes appreciated.  Psych: Alert and oriented x 1. Calm.   Antibiotics   Anti-infectives (From admission, onward)   Start     Dose/Rate Route Frequency Ordered Stop   04/24/19 1400  cephALEXin (KEFLEX) capsule 500 mg  Status:  Discontinued     500 mg Oral Every 8 hours 04/24/19 0947 04/25/19 1423   04/21/19 1600  cefTRIAXone (ROCEPHIN) 1 g in sodium chloride 0.9 % 100 mL IVPB  Status:  Discontinued     1 g 200 mL/hr over 30 Minutes Intravenous Every 24 hours 04/21/19 1454 04/24/19 0947      Medications   Scheduled Meds: . apixaban  5 mg Oral BID  . atorvastatin  20 mg Oral q1800  . budesonide (PULMICORT) nebulizer solution  0.25 mg Nebulization BID  . carvedilol  3.125 mg Oral BID WC  . docusate sodium  100 mg Oral BID  . dorzolamide-timolol  1 drop Both Eyes BID  . feeding supplement (ENSURE ENLIVE)  237 mL Oral BID BM  . furosemide  40 mg Intravenous Q12H  . ipratropium-albuterol  3 mL Nebulization Q6H  . latanoprost  1 drop Both Eyes QHS  . levothyroxine  88 mcg Oral Q0600  . multivitamin with minerals  1 tablet Oral Daily  . polyethylene glycol  17 g Oral Daily  . sodium chloride flush  3 mL Intravenous Q12H   Continuous Infusions: . sodium  chloride 250 mL (04/30/19 0934)   PRN Meds:.sodium chloride, acetaminophen, sodium chloride flush   Data Review:   Micro Results No results found for this or any previous visit (from the past 240 hour(s)).  Radiology Reports Ct Head Wo Contrast  Result Date: 04/20/2019 CLINICAL DATA:  Altered mental status. EXAM: CT HEAD WITHOUT CONTRAST TECHNIQUE: Contiguous axial images were obtained from the base of the skull through the vertex without intravenous contrast. COMPARISON:  None FINDINGS: Brain: Hypoattenuation in the left frontal operculum and involving the anterior insular ribbon is likely remote with some evidence for volume loss occluding ex vacuo dilation of the left lateral ventricle. Moderate diffuse atrophy and white matter disease is present otherwise. Basal ganglia are intact. The remainder of the left insular ribbon and the entire right insular ribbon are intact. The brainstem and cerebellum are within normal limits. No significant extraaxial fluid collection is present. Vascular: Diffuse vascular calcifications are present. There are calcifications in the distal M1 segments bilaterally. Dense calcifications are present in the cavernous internal carotid arteries and at the dural margin of both vertebral arteries. Skull: Calvarium is intact. No focal lytic or blastic lesions are present. Sinuses/Orbits: The paranasal sinuses and mastoid air cells are clear. Bilateral lens replacements are noted. Globes and orbits are otherwise unremarkable. IMPRESSION: 1. 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 2. Atrophy and white matter disease without other focal infarct. 3. Atherosclerosis Electronically Signed   By: San Morelle M.D.   On: 04/20/2019 18:15   US Carotid Bilateral  Result Date: 04/22/2019 CLINICAL DATA:  Dizziness. History of CAD, hypertension,  hyperlipidemia and smoking. EXAM: BILATERAL CAROTID DUPLEX ULTRASOUND TECHNIQUE: Pearline Cables scale imaging, color Doppler and duplex ultrasound were performed of bilateral carotid and vertebral arteries in the neck. COMPARISON:  None. FINDINGS: Criteria: Quantification of carotid stenosis is based on velocity parameters that correlate the residual internal carotid diameter with NASCET-based stenosis levels, using the diameter of  the distal internal carotid lumen as the denominator for stenosis measurement. The following velocity measurements were obtained: RIGHT ICA: 86/16 cm/sec CCA: [id number] cm/sec SYSTOLIC ICA/CCA RATIO:  1.8 ECA: 24 cm/sec LEFT ICA: 79/18 cm/sec CCA: [id number] cm/sec SYSTOLIC ICA/CCA RATIO:  2.2 ECA: 84 cm/sec RIGHT CAROTID ARTERY: There is a minimal amount of intimal thickening/atherosclerotic plaque seen throughout the right common carotid artery (images 3, 7 and 11). There is a moderate amount of eccentric echogenic partially shadowing plaque within the right carotid bulb (image 15), extending to involve the origin and proximal aspects of the right internal carotid artery (image 23), not resulting in elevated peak systolic velocities within the interrogated course of the right internal carotid artery to suggest a hemodynamically significant stenosis. RIGHT VERTEBRAL ARTERY:  Antegrade flow LEFT CAROTID ARTERY: There is a minimal amount of intimal thickening seen throughout the left common carotid artery (representative image 44). There is a moderate amount of eccentric echogenic partially shadowing plaque within the left carotid bulb (images 47 and 48), extending to involve the origin and proximal aspects of the left internal carotid artery (image 55), not resulting in elevated peak systolic velocities within the interrogated course of the left internal carotid artery to suggest a hemodynamically significant stenosis. LEFT VERTEBRAL ARTERY:  Antegrade flow Note is made of a cardiac arrhythmia  (representative image 50). IMPRESSION: 1. Moderate amount of bilateral atherosclerotic plaque, right subjectively greater than left, not resulting in a hemodynamically significant stenosis within either internal artery. 2. Incidental made of a cardiac arrhythmia. Further evaluation with ECG monitoring could be performed as indicated Electronically Signed   By: Sandi Mariscal M.D.   On: 04/22/2019 08:25   Dg Chest Port 1 View  Result Date: 04/25/2019 CLINICAL DATA:  Initial evaluation for acute CHF. EXAM: PORTABLE CHEST 1 VIEW COMPARISON:  Prior radiograph from 04/20/2019. FINDINGS: Moderate cardiomegaly, stable. Mediastinal silhouette within normal limits. Lung volumes are reduced. Veiling opacities overlying the bilateral hemidiaphragms most consistent with pleural effusions. Perihilar vascular congestion with mild interstitial prominence without frank pulmonary edema. Superimposed bibasilar opacities likely reflect atelectasis and/or effusions. Infiltrates would be difficult to exclude, and could be considered in the correct clinical setting. No pneumothorax. No acute osseous finding. Degenerative changes noted about the shoulders bilaterally, right greater than left. IMPRESSION: 1. Veiling opacities overlying the bilateral hemidiaphragms, most consistent with pleural effusions. Superimposed bibasilar opacities likely reflect atelectasis and/or effusions. Infiltrates would be difficult to exclude, and could be considered in the correct clinical setting. 2. Cardiomegaly with perihilar vascular congestion and interstitial prominence without frank pulmonary edema. Electronically Signed   By: Jeannine Boga M.D.   On: 04/25/2019 00:59   Dg Chest Port 1 View  Result Date: 04/20/2019 CLINICAL DATA:  Cough. EXAM: PORTABLE CHEST 1 VIEW COMPARISON:  Radiograph April 11, 2019. FINDINGS: Stable cardiomegaly. No pneumothorax is noted. Atherosclerosis of thoracic aorta is noted. Mild bibasilar atelectasis or  infiltrates are noted with small pleural effusions. Bony thorax is unremarkable. IMPRESSION: Mild bibasilar subsegmental atelectasis or infiltrates are noted with associated small pleural effusions. Aortic Atherosclerosis (ICD10-I70.0). Electronically Signed   By: Marijo Conception M.D.   On: 04/20/2019 16:41   Dg Chest Port 1 View  Result Date: 04/11/2019 CLINICAL DATA:  Leg and ankle swelling, low blood pressure EXAM: PORTABLE CHEST 1 VIEW COMPARISON:  10/13/2018 FINDINGS: Gross cardiomegaly. Subtle heterogeneous opacity of the right lung base with a probable small layering pleural effusion. The visualized skeletal structures are unremarkable. IMPRESSION: Gross cardiomegaly. Subtle heterogeneous opacity  of the right lung base with a probable small layering pleural effusion, concerning for infection or aspiration. Electronically Signed   By: Eddie Candle M.D.   On: 04/11/2019 16:59     CBC Recent Labs  Lab 04/28/19 0541 05/01/19 0512  WBC 5.1 4.6  HGB 13.1 13.9  HCT 38.6* 42.0  PLT 206 199  MCV 103.8* 107.7*  MCH 35.2* 35.6*  MCHC 33.9 33.1  RDW 13.3 13.6    Chemistries  Recent Labs  Lab 04/25/19 0705 04/26/19 0429 04/27/19 0623 04/28/19 0541 04/29/19 0551 04/30/19 0409  NA 131*  --  133* 137 140 142  K 5.9* 5.8* 5.0 4.1 3.3* 3.2*  CL 95*  --  96* 96* 96* 98  CO2 22  --  23 28 32 34*  GLUCOSE 108*  --  83 97 94 78  BUN 57*  --  80* 79* 67* 52*  CREATININE 1.58* 1.87* 1.68* 1.54* 1.38* 1.04  CALCIUM 9.2  --  9.0 8.8* 8.8* 8.6*  MG  --   --   --   --   --  2.5*   ------------------------------------------------------------------------------------------------------------------ estimated creatinine clearance is 43.5 mL/min (by C-G formula based on SCr of 1.04 mg/dL). ------------------------------------------------------------------------------------------------------------------ No results for input(s): HGBA1C in the last 72  hours. ------------------------------------------------------------------------------------------------------------------ No results for input(s): CHOL, HDL, LDLCALC, TRIG, CHOLHDL, LDLDIRECT in the last 72 hours. ------------------------------------------------------------------------------------------------------------------ No results for input(s): TSH, T4TOTAL, T3FREE, THYROIDAB in the last 72 hours.  Invalid input(s): FREET3 ------------------------------------------------------------------------------------------------------------------ No results for input(s): VITAMINB12, FOLATE, FERRITIN, TIBC, IRON, RETICCTPCT in the last 72 hours.  Coagulation profile No results for input(s): INR, PROTIME in the last 168 hours.  No results for input(s): DDIMER in the last 72 hours.  Cardiac Enzymes No results for input(s): CKMB, TROPONINI, MYOGLOBIN in the last 168 hours.  Invalid input(s): CK ------------------------------------------------------------------------------------------------------------------ Invalid input(s): POCBNP    Assessment & Plan   Acute systolic CHF- new diagnosis. LE edema has almost completely resolved. -ECHO 8/27 with EF 25-30% -Stop IV Lasix and switch to p.o. Lasix 40 mg daily starting tomorrow -Continue coreg -No ACE due to renal insufficiency -Nephrology following -Awaiting insurance authorization for SNF placement  Severe tricuspid and mitral regurgitation- seen on ECHO -Patient is not a candidate for aggressive intervention -Treatment as above  Hypokalemia- due to lasix -Replete and recheck  Chronic atrial fibrillation- pacemaker in place. -Continue Coreg and Eliquis  Subacute/chronic left frontal subcortical stroke -Seen on CT head this admission -Unable to obtain MRI due to pacemaker -Continue eliquis -PT recommending SNF  COPD- stable, no signs of acute exacerbation -Continue home inhalers  DVT prophylaxis- Eliquis  Sons at  bedside, updated on the plan.  Patient is medically stable for discharge.  Awaiting insurance authorization for SNF.      Code Status Orders  (From admission, onward)         Start     Ordered   04/11/19 2145  Full code  Continuous     04/11/19 2144        Code Status History    Date Active Date Inactive Code Status Order ID Comments User Context   10/13/2018 2302 10/19/2018 2134 Full Code [id number]  Demetrios Loll, MD Inpatient   Advance Care Planning Activity    Advance Directive Documentation     Most Recent Value  Type of Advance Directive  Healthcare Power of Attorney  Pre-existing out of facility DNR order (yellow form or pink MOST form)  -  "MOST" Form in Place?  -  Consults nephrology  DVT Prophylaxis  eliquis  Lab Results  Component Value Date   PLT 199 05/01/2019     Time Spent in minutes   60min Greater than 50% of time spent in care coordination and counseling patient regarding the condition and plan of care.   Berna Spare  M.D on 05/01/2019 at 3:25 PM  Between 7am to 6pm - Pager - [phone]  After 6pm go to [email or web address] - Proofreader  [employer]   Office  [phone]

## 2019-05-01 NOTE — Progress Notes (Signed)
Nutrition Follow Up Note   DOCUMENTATION CODES:   Not applicable  INTERVENTION:   Ensure Enlive po BID, each supplement provides 350 kcal and 20 grams of protein.  MVI daily   NUTRITION DIAGNOSIS:   Inadequate oral intake related to decreased appetite as evidenced by per patient/family report.  GOAL:   Patient will meet greater than or equal to 90% of their needs  -progressing   MONITOR:   PO intake, Supplement acceptance, Labs, Weight trends, I & O's  ASSESSMENT:   85 year old male with PMHx of glaucoma, HTN, A-fib, GERD, hypothyroidism, COPD admitted with new onset CHF.   Pt with improved appetite and oral intake; pt eating 25-95% of meals and drinking Ensure. Pt down ~15lbs since admit. Pt currently waiting on SNF placement.   Medications reviewed and include: colace, lasix, synthroid, MVI, protonix, miralax  Labs reviewed: K 3.2(L), Mg 2.5(H) MCV 107.7(H), MCH 35.6(H)  Diet Order:   Diet Order            Diet renal with fluid restriction Fluid restriction: 1200 mL Fluid; Room service appropriate? Yes; Fluid consistency: Thin  Diet effective now             EDUCATION NEEDS:   Not appropriate for education at this time  Skin:  Skin Assessment: Reviewed RN Assessment  Last BM:  9/11- type 6  Height:   Ht Readings from Last 1 Encounters:  04/11/19 5\' 6"  (1.676 m)   Weight:   Wt Readings from Last 1 Encounters:  05/01/19 70.6 kg   Ideal Body Weight:  64.5 kg  BMI:  Body mass index is 25.13 kg/m.  Estimated Nutritional Needs:   Kcal:  1700-1900  Protein:  85-95 grams  Fluid:  1.8 L/day  Koleen Distance MS, RD, LDN Pager #- [phone] Office#- [phone] After Hours Pager: [phone]

## 2019-05-02 LAB — BASIC METABOLIC PANEL
Anion gap: 8 (ref 5–15)
BUN: 39 mg/dL — ABNORMAL HIGH (ref 8–23)
CO2: 36 mmol/L — ABNORMAL HIGH (ref 22–32)
Calcium: 8.5 mg/dL — ABNORMAL LOW (ref 8.9–10.3)
Chloride: 95 mmol/L — ABNORMAL LOW (ref 98–111)
Creatinine, Ser: 0.95 mg/dL (ref 0.61–1.24)
GFR calc Af Amer: >60 mL/min (ref >60)
GFR calc non Af Amer: >60 mL/min (ref >60)
Glucose, Bld: 111 mg/dL — ABNORMAL HIGH (ref 70–99)
Potassium: 3.7 mmol/L (ref 3.5–5.1)
Sodium: 139 mmol/L (ref 135–145)

## 2019-05-02 NOTE — Plan of Care (Signed)
  Problem: Clinical Measurements: Goal: Will remain free from infection Outcome: Progressing Goal: Respiratory complications will improve Outcome: Progressing Goal: Cardiovascular complication will be avoided Outcome: Progressing   Problem: Education: Goal: Ability to demonstrate management of disease process will improve Outcome: Not Progressing Note: Discuss education with family, leave handouts for family/caregiver to review Goal: Ability to verbalize understanding of medication therapies will improve Outcome: Not Progressing   Problem: Education: Goal: Ability to demonstrate management of disease process will improve Outcome: Not Progressing Note: Discuss education with family, leave handouts for family/caregiver to review Goal: Ability to verbalize understanding of medication therapies will improve Outcome: Not Progressing

## 2019-05-02 NOTE — Plan of Care (Signed)
  Problem: Clinical Measurements: Goal: Will remain free from infection Outcome: Progressing Goal: Respiratory complications will improve Outcome: Progressing Goal: Cardiovascular complication will be avoided Outcome: Progressing   Problem: Education: Goal: Ability to demonstrate management of disease process will improve Outcome: Not Progressing Note: Discuss education with family, leave handouts for family/caregiver to review Goal: Ability to verbalize understanding of medication therapies will improve Outcome: Not Progressing

## 2019-05-02 NOTE — Progress Notes (Signed)
Timed, dated, and initialed a new sacral foam pad for patient. Chris, NT states he will put it on when he gives patient a bath prior to end of shift. Will update on flowsheet. Wenda Low [facility]

## 2019-05-02 NOTE — TOC Progression Note (Signed)
Transition of Care [facility]) - Progression Note    Patient Details  Name: Gabriel Delacruz MRN: [id number] Date of Birth: 08-03-1928  Transition of Care [facility]) CM/SW Contact  Elza Rafter, RN Phone Number: 05/02/2019, 2:47 PM  Clinical Narrative:   Updated son, Bill at bedside that patient is medically ready to discharge to [facility]; we are just waiting on insurance authorization.  He verbalizes understanding.    Expected Discharge Plan: [place] Barriers to Discharge: Continued Medical Work up  Expected Discharge Plan and Services Expected Discharge Plan: [place] In-house Referral: Clinical Social Work     Living arrangements for the past 2 months: Single Family Home                                       Social Determinants of Health (SDOH) Interventions    Readmission Risk Interventions Readmission Risk Prevention Plan 04/12/2019  Medication Screening Complete  Transportation Screening Complete  Some recent data might be hidden

## 2019-05-02 NOTE — Progress Notes (Signed)
Physical Therapy Treatment Patient Details Name: Gabriel Delacruz MRN: [id number] DOB: 01/20/1928 Today's Date: 05/02/2019    History of Present Illness 85 year old male patient with history of chronic atrial fibrillation, COPD, GERD, hyperlipidemia, hypertension, monoclonal gammopathy, hypothyroidism admitted for hypotension, leg swelling, work up showed new onset congestive heart failure    PT Comments    Pt agreeable to PT; denies pain. Pt demonstrating excellent progress this session. Pt alert and engaged well throughout session. Pt demonstrates bed mobility in/out of bed with Min A of 1. Sitting balance static and dynamic good with no LOB and tolerates Min to Mod perturbances without UE support. Pt demonstrates STS 4 times from mildly elevated surface (less so than last session) with Mod A improving to Min A during latter 2 stands. Continues with heavy lean to R; however, able to correct with cues much better toward midline. Stand tolerance between 20 and 40 seconds. Pt participates in LE exercises in both supine and seated position. Pt enjoyed sitting edge of bed and tolerates for extended period of time today. Continue PT to progress strength, endurance, and sitting and standing balance to improve all functional mobility.   Follow Up Recommendations  SNF     Equipment Recommendations  None recommended by PT    Recommendations for Other Services       Precautions / Restrictions Precautions Precautions: Fall Restrictions Weight Bearing Restrictions: No    Mobility  Bed Mobility Overal bed mobility: Needs Assistance Bed Mobility: Supine to Sit;Sit to Supine     Supine to sit: Min assist Sit to supine: Min assist   General bed mobility comments: Much improved today; Min A for trunk only  Transfers Overall transfer level: Needs assistance Equipment used: Rolling walker (2 wheeled) Transfers: Sit to/from Stand Sit to Stand: Mod assist;Min assist;From elevated surface          General transfer comment: Stood 4x with latter 2 requiring only Min A to stand and min to mod to work toward midline  Ambulation/Gait             General Gait Details: unable at this time   Marine scientist Rankin (Stroke Patients Only)       Balance Overall balance assessment: Needs assistance Sitting-balance support: Feet supported Sitting balance-Leahy Scale: Good Sitting balance - Comments: Tolerates Min to Mod perturbances without UE support in all directions   Standing balance support: Bilateral upper extremity supported Standing balance-Leahy Scale: Poor Standing balance comment: improving from Mod to Min support                            Cognition Arousal/Alertness: Awake/alert Behavior During Therapy: WFL for tasks assessed/performed Overall Cognitive Status: Within Functional Limits for tasks assessed                                        Exercises General Exercises - Lower Extremity Ankle Circles/Pumps: AROM;Both;20 reps;Supine Quad Sets: Strengthening;Both;10 reps Short Arc Quad: AROM;Both;20 reps Hip ABduction/ADduction: AAROM;Both;20 reps(assist to elevate LE from bed) Straight Leg Raises: AAROM;Both;10 reps Hip Flexion/Marching: AROM;AAROM;Both;10 reps(self assist LE as needed) Other Exercises Other Exercises: dynamic seated balance with arm lifts, reach and perturbances    General Comments        Pertinent Vitals/Pain Pain  Assessment: No/denies pain    Home Living                      Prior Function            PT Goals (current goals can now be found in the care plan section) Progress towards PT goals: Progressing toward goals    Frequency    Min 2X/week      PT Plan Current plan remains appropriate    Co-evaluation              AM-PAC PT "6 Clicks" Mobility   Outcome Measure  Help needed turning from your back to your side  while in a flat bed without using bedrails?: A Little Help needed moving from lying on your back to sitting on the side of a flat bed without using bedrails?: A Little Help needed moving to and from a bed to a chair (including a wheelchair)?: A Lot Help needed standing up from a chair using your arms (e.g., wheelchair or bedside chair)?: A Little Help needed to walk in hospital room?: Total Help needed climbing 3-5 steps with a railing? : Total 6 Click Score: 13    End of Session Equipment Utilized During Treatment: Gait belt Activity Tolerance: Patient tolerated treatment well Patient left: in bed;with call bell/phone within reach;with bed alarm set;with family/visitor present   PT Visit Diagnosis: Other abnormalities of gait and mobility (R26.89);Muscle weakness (generalized) (M62.81);Difficulty in walking, not elsewhere classified (R26.2)     Time: [id number] PT Time Calculation (min) (ACUTE ONLY): 45 min  Charges:  $Therapeutic Exercise: 8-22 mins $Therapeutic Activity: 8-22 mins $Neuromuscular Re-education: 8-22 mins                      Larae Grooms, PTA 05/02/2019, 4:23 PM

## 2019-05-02 NOTE — Plan of Care (Signed)
  Problem: Education: Goal: Knowledge of General Education information will improve Description: Including pain rating scale, medication(s)/side effects and non-pharmacologic comfort measures Outcome: Not Progressing Note: Patient is still drowsy and lethargic, and only speaks when spoken to. Will continue to monitor neurological status. Awaiting insurance authorization to transfer to [facility]. Wenda Low [facility]

## 2019-05-02 NOTE — Progress Notes (Signed)
[place] at [facility]                                                                                                                                                                                Patient Demographics   Gabriel Delacruz, is a 85 y.o. male, DOB - 07-12-1928, [id number]  Admit date - 04/11/2019   Admitting Physician Christel Mormon, MD  Outpatient Primary MD for the patient is Marinda Elk, MD   LOS - 21  Subjective: Patient states he is feeling fine today.  No family present at bedside.  Per RN, patient remains drowsy for most of the day.  Review of Systems:   CONSTITUTIONAL:  Review of Systems  Constitutional: Negative for chills and fever.  HENT: Negative for congestion and sore throat.   Eyes: Negative for blurred vision and double vision.  Respiratory: Negative for cough and shortness of breath.   Cardiovascular: Negative for chest pain and palpitations.  Gastrointestinal: Negative for nausea and vomiting.  Genitourinary: Negative for dysuria and urgency.  Musculoskeletal: Negative for back pain and neck pain.  Neurological: Negative for dizziness and headaches.  Psychiatric/Behavioral: Negative for depression. The patient is not nervous/anxious.    Vitals:   Vitals:   05/01/19 2041 05/02/19 0512 05/02/19 0723 05/02/19 0745  BP: 97/61 95/63 99/67    Pulse: 70 77 68   Resp:  18 19   Temp: 98.2 F (36.8 C) 97.7 F (36.5 C) 98.1 F (36.7 C)   TempSrc: Oral Oral    SpO2:  92% 92% 96%  Weight:  72.2 kg    Height:        Wt Readings from Last 3 Encounters:  05/02/19 72.2 kg  10/19/18 80.3 kg  07/04/18 77.3 kg     Intake/Output Summary (Last 24 hours) at 05/02/2019 1249 Last data filed at 05/02/2019 [id number] Gross per 24 hour  Intake -  Output 475 ml  Net -475 ml    Physical Exam:   GENERAL: Tired appearing, in no apparent distress.  Awake and alert. HEENT: Atraumatic, normocephalic. Extraocular muscles are intact.  Pupils equal and reactive to light. Sclerae anicteric. No conjunctival injection. No oro-pharyngeal erythema.  NECK: Supple. There is no jugular venous distention. No bruits, no lymphadenopathy, no thyromegaly.  HEART: Regular rate and rhythm,.  Positive murmurs, no rubs, no clicks.  LUNGS: + Diminished breath sounds in the lung bases bilaterally. No rales or rhonchi. No wheezes.  ABDOMEN: Soft, flat, nontender, nondistended. Has good bowel sounds. No hepatosplenomegaly appreciated.  EXTREMITIES: No evidence of any cyanosis, clubbing.  Trace pitting edema bilaterally. NEUROLOGIC: Alert, able to move all extremities. Able to murmur some words.  SKIN: Moist and warm with no rashes appreciated.  Psych: Alert and oriented x 1. Calm.   Antibiotics   Anti-infectives (From admission, onward)   Start     Dose/Rate Route Frequency Ordered Stop   04/24/19 1400  cephALEXin (KEFLEX) capsule 500 mg  Status:  Discontinued     500 mg Oral Every 8 hours 04/24/19 0947 04/25/19 1423   04/21/19 1600  cefTRIAXone (ROCEPHIN) 1 g in sodium chloride 0.9 % 100 mL IVPB  Status:  Discontinued     1 g 200 mL/hr over 30 Minutes Intravenous Every 24 hours 04/21/19 1454 04/24/19 0947      Medications   Scheduled Meds: . apixaban  5 mg Oral BID  . atorvastatin  20 mg Oral q1800  . budesonide (PULMICORT) nebulizer solution  0.25 mg Nebulization BID  . carvedilol  3.125 mg Oral BID WC  . docusate sodium  100 mg Oral BID  . dorzolamide-timolol  1 drop Both Eyes BID  . feeding supplement (ENSURE ENLIVE)  237 mL Oral BID BM  . furosemide  40 mg Oral Daily  . ipratropium-albuterol  3 mL Nebulization Q6H  . latanoprost  1 drop Both Eyes QHS  . levothyroxine  88 mcg Oral Q0600  . multivitamin with minerals  1 tablet Oral Daily  . polyethylene glycol  17 g Oral Daily  . sodium chloride flush  3 mL Intravenous Q12H   Continuous Infusions: . sodium chloride 250 mL (04/30/19 0934)   PRN Meds:.sodium chloride,  acetaminophen, sodium chloride flush   Data Review:   Micro Results No results found for this or any previous visit (from the past 240 hour(s)).  Radiology Reports Ct Head Wo Contrast  Result Date: 04/20/2019 CLINICAL DATA:  Altered mental status. EXAM: CT HEAD WITHOUT CONTRAST TECHNIQUE: Contiguous axial images were obtained from the base of the skull through the vertex without intravenous contrast. COMPARISON:  None FINDINGS: Brain: Hypoattenuation in the left frontal operculum and involving the anterior insular ribbon is likely remote with some evidence for volume loss occluding ex vacuo dilation of the left lateral ventricle. Moderate diffuse atrophy and white matter disease is present otherwise. Basal ganglia are intact. The remainder of the left insular ribbon and the entire right insular ribbon are intact. The brainstem and cerebellum are within normal limits. No significant extraaxial fluid collection is present. Vascular: Diffuse vascular calcifications are present. There are calcifications in the distal M1 segments bilaterally. Dense calcifications are present in the cavernous internal carotid arteries and at the dural margin of both vertebral arteries. Skull: Calvarium is intact. No focal lytic or blastic lesions are present. Sinuses/Orbits: The paranasal sinuses and mastoid air cells are clear. Bilateral lens replacements are noted. Globes and orbits are otherwise unremarkable. IMPRESSION: 1. 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 2. Atrophy and white matter disease without other focal infarct. 3. Atherosclerosis Electronically Signed   By: San Morelle M.D.   On: 04/20/2019 18:15   US Carotid Bilateral  Result Date: 04/22/2019 CLINICAL DATA:  Dizziness. History of CAD, hypertension, hyperlipidemia and smoking. EXAM: BILATERAL CAROTID DUPLEX ULTRASOUND  TECHNIQUE: Pearline Cables scale imaging, color Doppler and duplex ultrasound were performed of bilateral carotid and vertebral arteries in the neck. COMPARISON:  None. FINDINGS: Criteria: Quantification of carotid stenosis is based on velocity parameters that correlate the residual internal carotid diameter with NASCET-based stenosis levels, using the diameter of the  distal internal carotid lumen as the denominator for stenosis measurement. The following velocity measurements were obtained: RIGHT ICA: 86/16 cm/sec CCA: [id number] cm/sec SYSTOLIC ICA/CCA RATIO:  1.8 ECA: 24 cm/sec LEFT ICA: 79/18 cm/sec CCA: [id number] cm/sec SYSTOLIC ICA/CCA RATIO:  2.2 ECA: 84 cm/sec RIGHT CAROTID ARTERY: There is a minimal amount of intimal thickening/atherosclerotic plaque seen throughout the right common carotid artery (images 3, 7 and 11). There is a moderate amount of eccentric echogenic partially shadowing plaque within the right carotid bulb (image 15), extending to involve the origin and proximal aspects of the right internal carotid artery (image 23), not resulting in elevated peak systolic velocities within the interrogated course of the right internal carotid artery to suggest a hemodynamically significant stenosis. RIGHT VERTEBRAL ARTERY:  Antegrade flow LEFT CAROTID ARTERY: There is a minimal amount of intimal thickening seen throughout the left common carotid artery (representative image 44). There is a moderate amount of eccentric echogenic partially shadowing plaque within the left carotid bulb (images 47 and 48), extending to involve the origin and proximal aspects of the left internal carotid artery (image 55), not resulting in elevated peak systolic velocities within the interrogated course of the left internal carotid artery to suggest a hemodynamically significant stenosis. LEFT VERTEBRAL ARTERY:  Antegrade flow Note is made of a cardiac arrhythmia (representative image 50). IMPRESSION: 1. Moderate amount of bilateral  atherosclerotic plaque, right subjectively greater than left, not resulting in a hemodynamically significant stenosis within either internal artery. 2. Incidental made of a cardiac arrhythmia. Further evaluation with ECG monitoring could be performed as indicated Electronically Signed   By: Sandi Mariscal M.D.   On: 04/22/2019 08:25   Dg Chest Port 1 View  Result Date: 04/25/2019 CLINICAL DATA:  Initial evaluation for acute CHF. EXAM: PORTABLE CHEST 1 VIEW COMPARISON:  Prior radiograph from 04/20/2019. FINDINGS: Moderate cardiomegaly, stable. Mediastinal silhouette within normal limits. Lung volumes are reduced. Veiling opacities overlying the bilateral hemidiaphragms most consistent with pleural effusions. Perihilar vascular congestion with mild interstitial prominence without frank pulmonary edema. Superimposed bibasilar opacities likely reflect atelectasis and/or effusions. Infiltrates would be difficult to exclude, and could be considered in the correct clinical setting. No pneumothorax. No acute osseous finding. Degenerative changes noted about the shoulders bilaterally, right greater than left. IMPRESSION: 1. Veiling opacities overlying the bilateral hemidiaphragms, most consistent with pleural effusions. Superimposed bibasilar opacities likely reflect atelectasis and/or effusions. Infiltrates would be difficult to exclude, and could be considered in the correct clinical setting. 2. Cardiomegaly with perihilar vascular congestion and interstitial prominence without frank pulmonary edema. Electronically Signed   By: Jeannine Boga M.D.   On: 04/25/2019 00:59   Dg Chest Port 1 View  Result Date: 04/20/2019 CLINICAL DATA:  Cough. EXAM: PORTABLE CHEST 1 VIEW COMPARISON:  Radiograph April 11, 2019. FINDINGS: Stable cardiomegaly. No pneumothorax is noted. Atherosclerosis of thoracic aorta is noted. Mild bibasilar atelectasis or infiltrates are noted with small pleural effusions. Bony thorax is  unremarkable. IMPRESSION: Mild bibasilar subsegmental atelectasis or infiltrates are noted with associated small pleural effusions. Aortic Atherosclerosis (ICD10-I70.0). Electronically Signed   By: Marijo Conception M.D.   On: 04/20/2019 16:41   Dg Chest Port 1 View  Result Date: 04/11/2019 CLINICAL DATA:  Leg and ankle swelling, low blood pressure EXAM: PORTABLE CHEST 1 VIEW COMPARISON:  10/13/2018 FINDINGS: Gross cardiomegaly. Subtle heterogeneous opacity of the right lung base with a probable small layering pleural effusion. The visualized skeletal structures are unremarkable. IMPRESSION: Gross cardiomegaly. Subtle heterogeneous opacity of  the right lung base with a probable small layering pleural effusion, concerning for infection or aspiration. Electronically Signed   By: Eddie Candle M.D.   On: 04/11/2019 16:59     CBC Recent Labs  Lab 04/28/19 0541 05/01/19 0512  WBC 5.1 4.6  HGB 13.1 13.9  HCT 38.6* 42.0  PLT 206 199  MCV 103.8* 107.7*  MCH 35.2* 35.6*  MCHC 33.9 33.1  RDW 13.3 13.6    Chemistries  Recent Labs  Lab 04/27/19 0623 04/28/19 0541 04/29/19 0551 04/30/19 0409 05/02/19 0502  NA 133* 137 140 142 139  K 5.0 4.1 3.3* 3.2* 3.7  CL 96* 96* 96* 98 95*  CO2 23 28 32 34* 36*  GLUCOSE 83 97 94 78 111*  BUN 80* 79* 67* 52* 39*  CREATININE 1.68* 1.54* 1.38* 1.04 0.95  CALCIUM 9.0 8.8* 8.8* 8.6* 8.5*  MG  --   --   --  2.5*  --    ------------------------------------------------------------------------------------------------------------------ estimated creatinine clearance is 47.6 mL/min (by C-G formula based on SCr of 0.95 mg/dL). ------------------------------------------------------------------------------------------------------------------ No results for input(s): HGBA1C in the last 72 hours. ------------------------------------------------------------------------------------------------------------------ No results for input(s): CHOL, HDL, LDLCALC, TRIG,  CHOLHDL, LDLDIRECT in the last 72 hours. ------------------------------------------------------------------------------------------------------------------ No results for input(s): TSH, T4TOTAL, T3FREE, THYROIDAB in the last 72 hours.  Invalid input(s): FREET3 ------------------------------------------------------------------------------------------------------------------ No results for input(s): VITAMINB12, FOLATE, FERRITIN, TIBC, IRON, RETICCTPCT in the last 72 hours.  Coagulation profile No results for input(s): INR, PROTIME in the last 168 hours.  No results for input(s): DDIMER in the last 72 hours.  Cardiac Enzymes No results for input(s): CKMB, TROPONINI, MYOGLOBIN in the last 168 hours.  Invalid input(s): CK ------------------------------------------------------------------------------------------------------------------ Invalid input(s): POCBNP    Assessment & Plan   Acute systolic CHF- new diagnosis. LE edema has almost completely resolved. -ECHO 8/27 with EF 25-30% -Continue Lasix 40 mg p.o. daily -Continue coreg -No ACE due to renal insufficiency -Awaiting insurance authorization for SNF placement  Severe tricuspid and mitral regurgitation- seen on ECHO -Patient is not a candidate for aggressive intervention -Treatment as above  Hypokalemia- due to lasix -Replete and recheck  Chronic atrial fibrillation- pacemaker in place. -Continue Coreg and Eliquis  Subacute/chronic left frontal subcortical stroke -Seen on CT head this admission -Unable to obtain MRI due to pacemaker -Continue eliquis -PT recommending SNF  COPD- stable, no signs of acute exacerbation -Continue home inhalers  DVT prophylaxis- Eliquis  Patient is medically stable for discharge.  Awaiting insurance authorization for SNF.      Code Status Orders  (From admission, onward)         Start     Ordered   04/11/19 2145  Full code  Continuous     04/11/19 2144        Code  Status History    Date Active Date Inactive Code Status Order ID Comments User Context   10/13/2018 2302 10/19/2018 2134 Full Code [id number]  Demetrios Loll, MD Inpatient   Advance Care Planning Activity    Advance Directive Documentation     Most Recent Value  Type of Advance Directive  Healthcare Power of [place]  Pre-existing out of facility DNR order (yellow form or pink MOST form)  -  "MOST" Form in Place?  -     Consults nephrology  DVT Prophylaxis  eliquis  Lab Results  Component Value Date   PLT 199 05/01/2019     Time Spent in minutes   44min Greater than 50% of time spent in  care coordination and counseling patient regarding the condition and plan of care.   Berna Spare Itai Barbian M.D on 05/02/2019 at 12:49 PM  Between 7am to 6pm - Pager - [phone]  After 6pm go to [email or web address] - Proofreader  [employer]   Office  [phone]

## 2019-05-02 NOTE — Progress Notes (Signed)
Patient's MEWS appears to be elevated at 2 d/t systolic B.P. under 123XX123 and his LOC, both of which are not acute / concerning. Will continue to monitor. Wenda Low [facility]

## 2019-05-02 NOTE — Plan of Care (Signed)
  Problem: Clinical Measurements: Goal: Will remain free from infection Outcome: Progressing Goal: Diagnostic test results will improve Outcome: Progressing Goal: Respiratory complications will improve Outcome: Progressing Goal: Cardiovascular complication will be avoided Outcome: Progressing   Problem: Health Behavior/Discharge Planning: Goal: Ability to manage health-related needs will improve Outcome: Not Progressing   Problem: Clinical Measurements: Goal: Ability to maintain clinical measurements within normal limits will improve Outcome: Not Progressing   Problem: Activity: Goal: Risk for activity intolerance will decrease Outcome: Not Progressing Note: Patient is fatigued. Patient is unable to stay awake between care.

## 2019-05-03 MED ORDER — BISACODYL 5 MG PO TBEC
5.0000 mg | DELAYED_RELEASE_TABLET | Freq: Once | ORAL | Status: AC
  Administered 2019-05-03: 5 mg via ORAL
  Filled 2019-05-03: qty 1

## 2019-05-03 MED ORDER — IPRATROPIUM-ALBUTEROL 0.5-2.5 (3) MG/3ML IN SOLN: 3.0000 mL | Freq: Four times a day (QID) | RESPIRATORY_TRACT | Status: DC | PRN

## 2019-05-03 MED ORDER — IPRATROPIUM-ALBUTEROL 0.5-2.5 (3) MG/3ML IN SOLN
3.0000 mL | Freq: Three times a day (TID) | RESPIRATORY_TRACT | Status: DC
  Administered 2019-05-03 – 2019-05-04 (×4): 3 mL via RESPIRATORY_TRACT
  Filled 2019-05-03 (×4): qty 3

## 2019-05-03 MED ORDER — BISACODYL 5 MG PO TBEC
5.0000 mg | DELAYED_RELEASE_TABLET | Freq: Every day | ORAL | Status: DC | PRN
  Administered 2019-05-04: 5 mg via ORAL
  Filled 2019-05-03: qty 1

## 2019-05-03 NOTE — Progress Notes (Signed)
[place] at [facility]                                                                                                                                                                                Patient Demographics   Gabriel Delacruz, is a 85 y.o. male, DOB - 12-15-1927, [id number]  Admit date - 04/11/2019   Admitting Physician Christel Mormon, MD  Outpatient Primary MD for the patient is Marinda Elk, MD   LOS - 22  Subjective: Continues to do well today.  He has no concerns.  He continues having good urine output.  No chest pain or shortness of breath.  Review of Systems:   CONSTITUTIONAL:  Review of Systems  Constitutional: Negative for chills and fever.  HENT: Negative for congestion and sore throat.   Eyes: Negative for blurred vision and double vision.  Respiratory: Negative for cough and shortness of breath.   Cardiovascular: Negative for chest pain and palpitations.  Gastrointestinal: Negative for nausea and vomiting.  Genitourinary: Negative for dysuria and urgency.  Musculoskeletal: Negative for back pain and neck pain.  Neurological: Negative for dizziness and headaches.  Psychiatric/Behavioral: Negative for depression. The patient is not nervous/anxious.    Vitals:   Vitals:   05/02/19 2024 05/03/19 0128 05/03/19 0604 05/03/19 0749  BP:   96/67 (!) 97/59  Pulse:   72 72  Resp:    16  Temp:   97.6 F (36.4 C) 98.4 F (36.9 C)  TempSrc:   Oral   SpO2: 93% 98% 98% 97%  Weight:      Height:        Wt Readings from Last 3 Encounters:  05/02/19 72.2 kg  10/19/18 80.3 kg  07/04/18 77.3 kg     Intake/Output Summary (Last 24 hours) at 05/03/2019 1416 Last data filed at 05/03/2019 1403 Gross per 24 hour  Intake 180 ml  Output 0 ml  Net 180 ml    Physical Exam:   GENERAL: Tired appearing, in no apparent distress.  Awake and alert. HEENT: Atraumatic, normocephalic. Extraocular muscles are intact. Pupils equal and reactive  to light. Sclerae anicteric. No conjunctival injection. No oro-pharyngeal erythema.  NECK: Supple. There is no jugular venous distention. No bruits, no lymphadenopathy, no thyromegaly.  HEART: Regular rate and rhythm, no rubs, no clicks. II/VI systolic murmur LUNGS: + Diminished breath sounds in the lung bases bilaterally. No rales or rhonchi. No wheezes.  ABDOMEN: Soft, flat, nontender, nondistended. Has good bowel sounds. No hepatosplenomegaly appreciated.  EXTREMITIES: No evidence of any cyanosis, clubbing.  Trace pitting edema bilaterally. NEUROLOGIC: Alert, able to move all extremities. Able to murmur some words. SKIN: Moist  and warm with no rashes appreciated.  Psych: Alert and oriented x 1. Calm.   Antibiotics   Anti-infectives (From admission, onward)   Start     Dose/Rate Route Frequency Ordered Stop   04/24/19 1400  cephALEXin (KEFLEX) capsule 500 mg  Status:  Discontinued     500 mg Oral Every 8 hours 04/24/19 0947 04/25/19 1423   04/21/19 1600  cefTRIAXone (ROCEPHIN) 1 g in sodium chloride 0.9 % 100 mL IVPB  Status:  Discontinued     1 g 200 mL/hr over 30 Minutes Intravenous Every 24 hours 04/21/19 1454 04/24/19 0947      Medications   Scheduled Meds: . apixaban  5 mg Oral BID  . atorvastatin  20 mg Oral q1800  . budesonide (PULMICORT) nebulizer solution  0.25 mg Nebulization BID  . carvedilol  3.125 mg Oral BID WC  . docusate sodium  100 mg Oral BID  . dorzolamide-timolol  1 drop Both Eyes BID  . feeding supplement (ENSURE ENLIVE)  237 mL Oral BID BM  . furosemide  40 mg Oral Daily  . ipratropium-albuterol  3 mL Nebulization TID  . latanoprost  1 drop Both Eyes QHS  . levothyroxine  88 mcg Oral Q0600  . multivitamin with minerals  1 tablet Oral Daily  . polyethylene glycol  17 g Oral Daily  . sodium chloride flush  3 mL Intravenous Q12H   Continuous Infusions: . sodium chloride 250 mL (04/30/19 0934)   PRN Meds:.sodium chloride, acetaminophen, [START ON  05/04/2019] bisacodyl, ipratropium-albuterol, sodium chloride flush   Data Review:   Micro Results No results found for this or any previous visit (from the past 240 hour(s)).  Radiology Reports Ct Head Wo Contrast  Result Date: 04/20/2019 CLINICAL DATA:  Altered mental status. EXAM: CT HEAD WITHOUT CONTRAST TECHNIQUE: Contiguous axial images were obtained from the base of the skull through the vertex without intravenous contrast. COMPARISON:  None FINDINGS: Brain: Hypoattenuation in the left frontal operculum and involving the anterior insular ribbon is likely remote with some evidence for volume loss occluding ex vacuo dilation of the left lateral ventricle. Moderate diffuse atrophy and white matter disease is present otherwise. Basal ganglia are intact. The remainder of the left insular ribbon and the entire right insular ribbon are intact. The brainstem and cerebellum are within normal limits. No significant extraaxial fluid collection is present. Vascular: Diffuse vascular calcifications are present. There are calcifications in the distal M1 segments bilaterally. Dense calcifications are present in the cavernous internal carotid arteries and at the dural margin of both vertebral arteries. Skull: Calvarium is intact. No focal lytic or blastic lesions are present. Sinuses/Orbits: The paranasal sinuses and mastoid air cells are clear. Bilateral lens replacements are noted. Globes and orbits are otherwise unremarkable. IMPRESSION: 1. 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 2. Atrophy and white matter disease without other focal infarct. 3. Atherosclerosis Electronically Signed   By: San Morelle M.D.   On: 04/20/2019 18:15   US Carotid Bilateral  Result Date: 04/22/2019 CLINICAL DATA:  Dizziness. History of CAD, hypertension, hyperlipidemia and smoking. EXAM: BILATERAL  CAROTID DUPLEX ULTRASOUND TECHNIQUE: Pearline Cables scale imaging, color Doppler and duplex ultrasound were performed of bilateral carotid and vertebral arteries in the neck. COMPARISON:  None. FINDINGS: Criteria: Quantification of carotid stenosis is based on velocity parameters that correlate the residual internal carotid diameter with NASCET-based stenosis levels, using the  diameter of the distal internal carotid lumen as the denominator for stenosis measurement. The following velocity measurements were obtained: RIGHT ICA: 86/16 cm/sec CCA: [id number] cm/sec SYSTOLIC ICA/CCA RATIO:  1.8 ECA: 24 cm/sec LEFT ICA: 79/18 cm/sec CCA: [id number] cm/sec SYSTOLIC ICA/CCA RATIO:  2.2 ECA: 84 cm/sec RIGHT CAROTID ARTERY: There is a minimal amount of intimal thickening/atherosclerotic plaque seen throughout the right common carotid artery (images 3, 7 and 11). There is a moderate amount of eccentric echogenic partially shadowing plaque within the right carotid bulb (image 15), extending to involve the origin and proximal aspects of the right internal carotid artery (image 23), not resulting in elevated peak systolic velocities within the interrogated course of the right internal carotid artery to suggest a hemodynamically significant stenosis. RIGHT VERTEBRAL ARTERY:  Antegrade flow LEFT CAROTID ARTERY: There is a minimal amount of intimal thickening seen throughout the left common carotid artery (representative image 44). There is a moderate amount of eccentric echogenic partially shadowing plaque within the left carotid bulb (images 47 and 48), extending to involve the origin and proximal aspects of the left internal carotid artery (image 55), not resulting in elevated peak systolic velocities within the interrogated course of the left internal carotid artery to suggest a hemodynamically significant stenosis. LEFT VERTEBRAL ARTERY:  Antegrade flow Note is made of a cardiac arrhythmia (representative image 50). IMPRESSION: 1. Moderate amount  of bilateral atherosclerotic plaque, right subjectively greater than left, not resulting in a hemodynamically significant stenosis within either internal artery. 2. Incidental made of a cardiac arrhythmia. Further evaluation with ECG monitoring could be performed as indicated Electronically Signed   By: Sandi Mariscal M.D.   On: 04/22/2019 08:25   Dg Chest Port 1 View  Result Date: 04/25/2019 CLINICAL DATA:  Initial evaluation for acute CHF. EXAM: PORTABLE CHEST 1 VIEW COMPARISON:  Prior radiograph from 04/20/2019. FINDINGS: Moderate cardiomegaly, stable. Mediastinal silhouette within normal limits. Lung volumes are reduced. Veiling opacities overlying the bilateral hemidiaphragms most consistent with pleural effusions. Perihilar vascular congestion with mild interstitial prominence without frank pulmonary edema. Superimposed bibasilar opacities likely reflect atelectasis and/or effusions. Infiltrates would be difficult to exclude, and could be considered in the correct clinical setting. No pneumothorax. No acute osseous finding. Degenerative changes noted about the shoulders bilaterally, right greater than left. IMPRESSION: 1. Veiling opacities overlying the bilateral hemidiaphragms, most consistent with pleural effusions. Superimposed bibasilar opacities likely reflect atelectasis and/or effusions. Infiltrates would be difficult to exclude, and could be considered in the correct clinical setting. 2. Cardiomegaly with perihilar vascular congestion and interstitial prominence without frank pulmonary edema. Electronically Signed   By: Jeannine Boga M.D.   On: 04/25/2019 00:59   Dg Chest Port 1 View  Result Date: 04/20/2019 CLINICAL DATA:  Cough. EXAM: PORTABLE CHEST 1 VIEW COMPARISON:  Radiograph April 11, 2019. FINDINGS: Stable cardiomegaly. No pneumothorax is noted. Atherosclerosis of thoracic aorta is noted. Mild bibasilar atelectasis or infiltrates are noted with small pleural effusions. Bony thorax is  unremarkable. IMPRESSION: Mild bibasilar subsegmental atelectasis or infiltrates are noted with associated small pleural effusions. Aortic Atherosclerosis (ICD10-I70.0). Electronically Signed   By: Marijo Conception M.D.   On: 04/20/2019 16:41   Dg Chest Port 1 View  Result Date: 04/11/2019 CLINICAL DATA:  Leg and ankle swelling, low blood pressure EXAM: PORTABLE CHEST 1 VIEW COMPARISON:  10/13/2018 FINDINGS: Gross cardiomegaly. Subtle heterogeneous opacity of the right lung base with a probable small layering pleural effusion. The visualized skeletal structures are unremarkable. IMPRESSION: Gross cardiomegaly. Subtle  heterogeneous opacity of the right lung base with a probable small layering pleural effusion, concerning for infection or aspiration. Electronically Signed   By: Eddie Candle M.D.   On: 04/11/2019 16:59     CBC Recent Labs  Lab 04/28/19 0541 05/01/19 0512  WBC 5.1 4.6  HGB 13.1 13.9  HCT 38.6* 42.0  PLT 206 199  MCV 103.8* 107.7*  MCH 35.2* 35.6*  MCHC 33.9 33.1  RDW 13.3 13.6    Chemistries  Recent Labs  Lab 04/27/19 0623 04/28/19 0541 04/29/19 0551 04/30/19 0409 05/02/19 0502  NA 133* 137 140 142 139  K 5.0 4.1 3.3* 3.2* 3.7  CL 96* 96* 96* 98 95*  CO2 23 28 32 34* 36*  GLUCOSE 83 97 94 78 111*  BUN 80* 79* 67* 52* 39*  CREATININE 1.68* 1.54* 1.38* 1.04 0.95  CALCIUM 9.0 8.8* 8.8* 8.6* 8.5*  MG  --   --   --  2.5*  --    ------------------------------------------------------------------------------------------------------------------ estimated creatinine clearance is 47.6 mL/min (by C-G formula based on SCr of 0.95 mg/dL). ------------------------------------------------------------------------------------------------------------------ No results for input(s): HGBA1C in the last 72 hours. ------------------------------------------------------------------------------------------------------------------ No results for input(s): CHOL, HDL, LDLCALC, TRIG,  CHOLHDL, LDLDIRECT in the last 72 hours. ------------------------------------------------------------------------------------------------------------------ No results for input(s): TSH, T4TOTAL, T3FREE, THYROIDAB in the last 72 hours.  Invalid input(s): FREET3 ------------------------------------------------------------------------------------------------------------------ No results for input(s): VITAMINB12, FOLATE, FERRITIN, TIBC, IRON, RETICCTPCT in the last 72 hours.  Coagulation profile No results for input(s): INR, PROTIME in the last 168 hours.  No results for input(s): DDIMER in the last 72 hours.  Cardiac Enzymes No results for input(s): CKMB, TROPONINI, MYOGLOBIN in the last 168 hours.  Invalid input(s): CK ------------------------------------------------------------------------------------------------------------------ Invalid input(s): POCBNP    Assessment & Plan   Acute systolic CHF- new diagnosis.  And appears euvolemic. -ECHO 8/27 with EF 25-30% -Continue Lasix 40 mg p.o. daily -Continue coreg -No ACE due to renal insufficiency -Awaiting insurance authorization for SNF placement  Severe tricuspid and mitral regurgitation- seen on ECHO -Patient is not a candidate for aggressive intervention -Treatment as above  Hypokalemia- due to lasix -Replete and recheck  Chronic atrial fibrillation- pacemaker in place. -Continue Coreg and Eliquis  Subacute/chronic left frontal subcortical stroke -Seen on CT head this admission -Unable to obtain MRI due to pacemaker -Continue eliquis -PT recommending SNF  COPD- stable, no signs of acute exacerbation -Continue home inhalers  DVT prophylaxis- Eliquis  Patient is medically stable for discharge.  Awaiting insurance authorization for SNF.      Code Status Orders  (From admission, onward)         Start     Ordered   04/11/19 2145  Full code  Continuous     04/11/19 2144        Code Status History     Date Active Date Inactive Code Status Order ID Comments User Context   10/13/2018 2302 10/19/2018 2134 Full Code [id number]  Demetrios Loll, MD Inpatient   Advance Care Planning Activity    Advance Directive Documentation     Most Recent Value  Type of Advance Directive  Healthcare Power of [place]  Pre-existing out of facility DNR order (yellow form or pink MOST form)  -  "MOST" Form in Place?  -     Consults nephrology  DVT Prophylaxis  eliquis  Lab Results  Component Value Date   PLT 199 05/01/2019     Time Spent in minutes   62min Greater than 50% of time spent  in care coordination and counseling patient regarding the condition and plan of care.   Berna Spare  M.D on 05/03/2019 at 2:16 PM  Between 7am to 6pm - Pager - [phone]  After 6pm go to [email or web address] - Proofreader  [employer]   Office  [phone]

## 2019-05-03 NOTE — Care Management Important Message (Signed)
Important Message  Patient Details  Name: Gabriel Delacruz MRN: [id number] Date of Birth: [birth year]   Medicare Important Message Given:  Yes     [name] 05/03/2019, 1:25 PM

## 2019-05-04 DIAGNOSIS — I5021 Acute systolic (congestive) heart failure: Secondary | ICD-10-CM | POA: Diagnosis not present

## 2019-05-04 DIAGNOSIS — E860 Dehydration: Secondary | ICD-10-CM | POA: Diagnosis present

## 2019-05-04 DIAGNOSIS — H409 Unspecified glaucoma: Secondary | ICD-10-CM | POA: Diagnosis present

## 2019-05-04 DIAGNOSIS — J42 Unspecified chronic bronchitis: Secondary | ICD-10-CM | POA: Diagnosis not present

## 2019-05-04 DIAGNOSIS — E162 Hypoglycemia, unspecified: Secondary | ICD-10-CM | POA: Diagnosis not present

## 2019-05-04 DIAGNOSIS — R404 Transient alteration of awareness: Secondary | ICD-10-CM | POA: Diagnosis not present

## 2019-05-04 DIAGNOSIS — R531 Weakness: Secondary | ICD-10-CM | POA: Diagnosis not present

## 2019-05-04 DIAGNOSIS — Z95810 Presence of automatic (implantable) cardiac defibrillator: Secondary | ICD-10-CM | POA: Diagnosis not present

## 2019-05-04 DIAGNOSIS — Z8249 Family history of ischemic heart disease and other diseases of the circulatory system: Secondary | ICD-10-CM | POA: Diagnosis not present

## 2019-05-04 DIAGNOSIS — Z87891 Personal history of nicotine dependence: Secondary | ICD-10-CM | POA: Diagnosis not present

## 2019-05-04 DIAGNOSIS — Z9981 Dependence on supplemental oxygen: Secondary | ICD-10-CM | POA: Diagnosis not present

## 2019-05-04 DIAGNOSIS — L89152 Pressure ulcer of sacral region, stage 2: Secondary | ICD-10-CM | POA: Diagnosis not present

## 2019-05-04 DIAGNOSIS — I779 Disorder of arteries and arterioles, unspecified: Secondary | ICD-10-CM | POA: Diagnosis not present

## 2019-05-04 DIAGNOSIS — Z950 Presence of cardiac pacemaker: Secondary | ICD-10-CM | POA: Diagnosis not present

## 2019-05-04 DIAGNOSIS — E785 Hyperlipidemia, unspecified: Secondary | ICD-10-CM | POA: Diagnosis present

## 2019-05-04 DIAGNOSIS — R0689 Other abnormalities of breathing: Secondary | ICD-10-CM | POA: Diagnosis not present

## 2019-05-04 DIAGNOSIS — Z66 Do not resuscitate: Secondary | ICD-10-CM | POA: Diagnosis not present

## 2019-05-04 DIAGNOSIS — E0590 Thyrotoxicosis, unspecified without thyrotoxic crisis or storm: Secondary | ICD-10-CM | POA: Diagnosis not present

## 2019-05-04 DIAGNOSIS — I480 Paroxysmal atrial fibrillation: Secondary | ICD-10-CM | POA: Diagnosis not present

## 2019-05-04 DIAGNOSIS — R609 Edema, unspecified: Secondary | ICD-10-CM | POA: Diagnosis not present

## 2019-05-04 DIAGNOSIS — J449 Chronic obstructive pulmonary disease, unspecified: Secondary | ICD-10-CM | POA: Diagnosis not present

## 2019-05-04 DIAGNOSIS — I890 Lymphedema, not elsewhere classified: Secondary | ICD-10-CM | POA: Diagnosis present

## 2019-05-04 DIAGNOSIS — Z7189 Other specified counseling: Secondary | ICD-10-CM | POA: Diagnosis not present

## 2019-05-04 DIAGNOSIS — I4821 Permanent atrial fibrillation: Secondary | ICD-10-CM | POA: Diagnosis not present

## 2019-05-04 DIAGNOSIS — N390 Urinary tract infection, site not specified: Secondary | ICD-10-CM | POA: Diagnosis not present

## 2019-05-04 DIAGNOSIS — M1A9XX0 Chronic gout, unspecified, without tophus (tophi): Secondary | ICD-10-CM | POA: Diagnosis present

## 2019-05-04 DIAGNOSIS — N170 Acute kidney failure with tubular necrosis: Secondary | ICD-10-CM | POA: Diagnosis not present

## 2019-05-04 DIAGNOSIS — Z515 Encounter for palliative care: Secondary | ICD-10-CM | POA: Diagnosis not present

## 2019-05-04 DIAGNOSIS — Z7401 Bed confinement status: Secondary | ICD-10-CM | POA: Diagnosis not present

## 2019-05-04 DIAGNOSIS — J9691 Respiratory failure, unspecified with hypoxia: Secondary | ICD-10-CM | POA: Diagnosis not present

## 2019-05-04 DIAGNOSIS — I5022 Chronic systolic (congestive) heart failure: Secondary | ICD-10-CM | POA: Diagnosis not present

## 2019-05-04 DIAGNOSIS — M2550 Pain in unspecified joint: Secondary | ICD-10-CM | POA: Diagnosis not present

## 2019-05-04 DIAGNOSIS — R918 Other nonspecific abnormal finding of lung field: Secondary | ICD-10-CM | POA: Diagnosis not present

## 2019-05-04 DIAGNOSIS — I5023 Acute on chronic systolic (congestive) heart failure: Secondary | ICD-10-CM | POA: Diagnosis not present

## 2019-05-04 DIAGNOSIS — R062 Wheezing: Secondary | ICD-10-CM | POA: Diagnosis not present

## 2019-05-04 DIAGNOSIS — N179 Acute kidney failure, unspecified: Secondary | ICD-10-CM | POA: Diagnosis not present

## 2019-05-04 DIAGNOSIS — R5381 Other malaise: Secondary | ICD-10-CM | POA: Diagnosis not present

## 2019-05-04 DIAGNOSIS — R069 Unspecified abnormalities of breathing: Secondary | ICD-10-CM | POA: Diagnosis not present

## 2019-05-04 DIAGNOSIS — I10 Essential (primary) hypertension: Secondary | ICD-10-CM | POA: Diagnosis not present

## 2019-05-04 DIAGNOSIS — Z79890 Hormone replacement therapy (postmenopausal): Secondary | ICD-10-CM | POA: Diagnosis not present

## 2019-05-04 DIAGNOSIS — Z20828 Contact with and (suspected) exposure to other viral communicable diseases: Secondary | ICD-10-CM | POA: Diagnosis not present

## 2019-05-04 DIAGNOSIS — I110 Hypertensive heart disease with heart failure: Secondary | ICD-10-CM | POA: Diagnosis not present

## 2019-05-04 DIAGNOSIS — Z79899 Other long term (current) drug therapy: Secondary | ICD-10-CM | POA: Diagnosis not present

## 2019-05-04 DIAGNOSIS — I340 Nonrheumatic mitral (valve) insufficiency: Secondary | ICD-10-CM | POA: Diagnosis not present

## 2019-05-04 DIAGNOSIS — R319 Hematuria, unspecified: Secondary | ICD-10-CM | POA: Diagnosis not present

## 2019-05-04 DIAGNOSIS — E039 Hypothyroidism, unspecified: Secondary | ICD-10-CM | POA: Diagnosis not present

## 2019-05-04 DIAGNOSIS — G9341 Metabolic encephalopathy: Secondary | ICD-10-CM | POA: Diagnosis not present

## 2019-05-04 DIAGNOSIS — Z7951 Long term (current) use of inhaled steroids: Secondary | ICD-10-CM | POA: Diagnosis not present

## 2019-05-04 DIAGNOSIS — Z7901 Long term (current) use of anticoagulants: Secondary | ICD-10-CM | POA: Diagnosis not present

## 2019-05-04 DIAGNOSIS — D649 Anemia, unspecified: Secondary | ICD-10-CM | POA: Diagnosis not present

## 2019-05-04 DIAGNOSIS — I482 Chronic atrial fibrillation, unspecified: Secondary | ICD-10-CM | POA: Diagnosis not present

## 2019-05-04 LAB — CBC
HCT: 42.3 % (ref 39.0–52.0)
Hemoglobin: 13.9 g/dL (ref 13.0–17.0)
MCH: 35 pg — ABNORMAL HIGH (ref 26.0–34.0)
MCHC: 32.9 g/dL (ref 30.0–36.0)
MCV: 106.5 fL — ABNORMAL HIGH (ref 80.0–100.0)
Platelets: 175 10*3/uL (ref 150–400)
RBC: 3.97 MIL/uL — ABNORMAL LOW (ref 4.22–5.81)
RDW: 13.2 % (ref 11.5–15.5)
WBC: 4.7 10*3/uL (ref 4.0–10.5)
nRBC: 0 % (ref 0.0–0.2)

## 2019-05-04 LAB — SARS CORONAVIRUS 2 BY RT PCR (HOSPITAL ORDER, PERFORMED IN ~~LOC~~ HOSPITAL LAB): SARS Coronavirus 2: NEGATIVE

## 2019-05-04 MED ORDER — APIXABAN 5 MG PO TABS: 5.0000 mg | ORAL_TABLET | Freq: Two times a day (BID) | ORAL | 0 refills | Status: DC

## 2019-05-04 MED ORDER — DOCUSATE SODIUM 100 MG PO CAPS: 100.0000 mg | ORAL_CAPSULE | Freq: Two times a day (BID) | ORAL | 0 refills | Status: DC

## 2019-05-04 MED ORDER — LEVOTHYROXINE SODIUM 88 MCG PO TABS: 88.0000 ug | ORAL_TABLET | Freq: Every day | ORAL | 0 refills | Status: DC

## 2019-05-04 MED ORDER — CARVEDILOL 3.125 MG PO TABS: 3.1250 mg | ORAL_TABLET | Freq: Two times a day (BID) | ORAL | 0 refills | Status: DC

## 2019-05-04 MED ORDER — ENSURE ENLIVE PO LIQD: 237.0000 mL | Freq: Two times a day (BID) | ORAL | 12 refills | Status: DC

## 2019-05-04 MED ORDER — ATORVASTATIN CALCIUM 20 MG PO TABS: 20.0000 mg | ORAL_TABLET | Freq: Every day | ORAL | 0 refills | Status: DC

## 2019-05-04 NOTE — TOC Transition Note (Signed)
Transition of Care [facility]) - CM/SW Discharge Note   Patient Details  Name: Gabriel Delacruz MRN: [id number] Date of Birth: 06-04-1928  [facility] of [facility] CM/SW Contact:  Weston Anna, LCSW Phone Number: 05/04/2019, 10:26 AM   Clinical Narrative:     Patient is set to discharge to [facility] today. Son, [name], notified of discharge plans and is agreeable to discharge. Patient will need transportation via EMS. Please call report to [phone].   Final next level of care: Skilled Nursing Facility Barriers to Discharge: No Barriers Identified   Patient Goals and CMS Choice Patient states their goals for this hospitalization and ongoing recovery are:: Return home CMS Medicare.gov Compare Post Acute Care list provided to:: Patient Represenative (must comment) Choice offered to / list presented to : Adult Children  Discharge Placement              Patient [facility] bed at: [facility] Patient to be transferred to facility by: [facility] Name of family member notified: Bill Patient and family notified of of transfer: 05/04/19  Discharge Plan and Services In-house Referral: Clinical Social Work              DME Arranged: N/A         HH Arranged: NA          Social Determinants of Health (SDOH) Interventions     Readmission Risk Interventions Readmission Risk Prevention Plan 04/12/2019  Medication Screening Complete  Transportation Screening Complete  Some recent data might be hidden

## 2019-05-04 NOTE — Progress Notes (Signed)
Per [employer] pt needs to be tested for covid, Dr. Brett Albino notified, will put order in for rapid covid test, swab sent to lab. Awaiting results.

## 2019-05-04 NOTE — Discharge Summary (Signed)
[facility] NAME: Gabriel Delacruz    MR#:  [id number]  DATE OF BIRTH:  [birth year]  DATE OF ADMISSION:  04/11/2019   ADMITTING PHYSICIAN: Christel Mormon, MD  DATE OF DISCHARGE: 05/04/19  PRIMARY CARE PHYSICIAN: Marinda Elk, MD   ADMISSION DIAGNOSIS:  Leg edema [R60.0] SOB (shortness of breath) [R06.02] Leg swelling [M79.89] DISCHARGE DIAGNOSIS:  Active Problems:   Acute exacerbation of CHF (congestive heart failure) ([place])   Goals of care, counseling/discussion   Palliative care by specialist   DNR (do not resuscitate) discussion   Cough   Pressure injury of skin  SECONDARY DIAGNOSIS:   Past Medical History:  Diagnosis Date  . Anemia   . Atrial fibrillation ([place])   . COPD (chronic obstructive pulmonary disease) ([place])   . GERD (gastroesophageal reflux disease)   . Glaucoma   . Hyperlipemia   . Hypertension   . Hypothyroidism   . Irregular heart rhythm   . Left anterior fascicular block   . MGUS (monoclonal gammopathy of unknown significance)   . RBBB    HOSPITAL COURSE:   Gabriel Delacruz is an 85 year old male who presented to the ED with worsening lower extremity edmea. In the ED, BNP was elevated and CXR showed a right pleural effusion.   Acute systolic CHF- new diagnosis. Volume status has improved and patient is euvoluemic. -ECHO 8/27 with EF 25-30% -Continued Lasix 40 mg p.o. daily -Continued coreg -No ACE due to renal insufficiency -PT recommended SNF -Needs to follow-up with cardiologist in 1-2 weeks  Severe tricuspid and mitral regurgitation- seen on ECHO -Patient is not a candidate for aggressive intervention -Treatment as above  Chronic atrial fibrillation- pacemaker in place. -Continued Coreg and Eliquis  Subacute/chronic left frontal subcortical stroke -Seen on CT head this admission -Unable to obtain MRI due to pacemaker -Continued eliquis -PT recommending SNF  COPD- stable, no signs of  acute exacerbation -Continued home inhalers  Hypothyroidism- uncontrolled. TSH was high and T3/T4 were low this admission. -Home synthroid dose was increased from 31mcg daily to 32mcg daily -Needs thyroid labs rechecked as an outpatient  DISCHARGE CONDITIONS:  Chronic systolic CHF Severe tricuspid and mitral regurgitation Chronic atrial fibrillation Subacute/chronic left frontal subcortical stroke COPD Hypothyroidism CONSULTS OBTAINED:  Treatment Team:  Leotis Pain, MD DRUG ALLERGIES:  No Known Allergies DISCHARGE MEDICATIONS:   Allergies as of 05/04/2019   No Known Allergies     Medication List    STOP taking these medications   warfarin 6 MG tablet Commonly known as: COUMADIN     TAKE these medications   Advair Diskus 250-50 MCG/DOSE Aepb Generic drug: Fluticasone-Salmeterol Inhale 1 puff into the lungs daily.   apixaban 5 MG Tabs tablet Commonly known as: ELIQUIS Take 1 tablet (5 mg total) by mouth 2 (two) times daily.   atorvastatin 20 MG tablet Commonly known as: LIPITOR Take 1 tablet (20 mg total) by mouth daily at 6 PM.   carvedilol 3.125 MG tablet Commonly known as: COREG Take 1 tablet (3.125 mg total) by mouth 2 (two) times daily with a meal.   docusate sodium 100 MG capsule Commonly known as: COLACE Take 1 capsule (100 mg total) by mouth 2 (two) times daily.   dorzolamide-timolol 22.3-6.8 MG/ML ophthalmic solution Commonly known as: COSOPT Place 1 drop into both eyes 2 (two) times daily.   feeding supplement (ENSURE ENLIVE) Liqd Take 237 mLs by mouth 2 (two) times daily between meals.  furosemide 40 MG tablet Commonly known as: LASIX Take 1 tablet (40 mg total) by mouth daily.   levothyroxine 75 MCG tablet Commonly known as: SYNTHROID Take 75 mcg by mouth daily before breakfast.   multivitamin-iron-minerals-folic acid chewable tablet Chew 1 tablet by mouth daily.   potassium chloride 10 MEQ tablet Commonly known as: K-DUR Take 10  mEq by mouth daily.   ProAir HFA 108 (90 Base) MCG/ACT inhaler Generic drug: albuterol Inhale 2 puffs into the lungs every 6 (six) hours as needed.   Travatan Z 0.004 % Soln ophthalmic solution Generic drug: Travoprost (BAK Free) PLACE 1 DROP INTO BOTH EYES ONCE DAILY        DISCHARGE INSTRUCTIONS:  1. F/u with PCP in 5 days 2. F/u with cardiologist in 1-2 weeks DIET:  Cardiac diet- needs assistance with feeds DISCHARGE CONDITION:  Stable ACTIVITY:  Activity as tolerated OXYGEN:  Home Oxygen: Yes.    Oxygen Delivery: 2 liters/min via Patient connected to nasal cannula oxygen DISCHARGE LOCATION:  nursing home   If you experience worsening of your admission symptoms, develop shortness of breath, life threatening emergency, suicidal or homicidal thoughts you must seek medical attention immediately by calling 911 or calling your MD immediately  if symptoms less severe.  You Must read complete instructions/literature along with all the possible adverse reactions/side effects for all the Medicines you take and that have been prescribed to you. Take any new Medicines after you have completely understood and accpet all the possible adverse reactions/side effects.   Please note  You were cared for by a hospitalist during your hospital stay. If you have any questions about your discharge medications or the care you received while you were in the hospital after you are discharged, you can call the unit and asked to speak with the hospitalist on call if the hospitalist that took care of you is not available. Once you are discharged, your primary care physician will handle any further medical issues. Please note that NO REFILLS for any discharge medications will be authorized once you are discharged, as it is imperative that you return to your primary care physician (or establish a relationship with a primary care physician if you do not have one) for your aftercare needs so that they can  reassess your need for medications and monitor your lab values.    On the day of Discharge:  VITAL SIGNS:  Blood pressure 103/64, pulse 72, temperature 97.7 F (36.5 C), temperature source Oral, resp. rate 18, height 5\' 6"  (1.676 m), weight 73.1 kg, SpO2 95 %. PHYSICAL EXAMINATION:  GENERAL:  85 y.o.-year-old patient lying in the bed with no acute distress. Sitting up in bed, eating breakfast. EYES: Pupils equal, round, reactive to light and accommodation. No scleral icterus. Extraocular muscles intact.  HEENT: Head atraumatic, normocephalic. Oropharynx and nasopharynx clear.  NECK:  Supple, no jugular venous distention. No thyroid enlargement, no tenderness.  LUNGS: Normal breath sounds bilaterally, no wheezing, rales,rhonchi or crepitation. No use of accessory muscles of respiration.  CARDIOVASCULAR: RRR, S1, S2 normal. No murmurs, rubs, or gallops.  ABDOMEN: Soft, non-tender, non-distended. Bowel sounds present. No organomegaly or mass.  EXTREMITIES: No pedal edema, cyanosis, or clubbing.  NEUROLOGIC: Cranial nerves II through XII are intact. +global weakness. Sensation intact. Gait not checked.  PSYCHIATRIC: The patient is alert and oriented x 2.  SKIN: No obvious rash, lesion, or ulcer.  DATA REVIEW:   CBC Recent Labs  Lab 05/01/19 0512  WBC 4.6  HGB 13.9  HCT 42.0  PLT 199    Chemistries  Recent Labs  Lab 04/30/19 0409 05/02/19 0502  NA 142 139  K 3.2* 3.7  CL 98 95*  CO2 34* 36*  GLUCOSE 78 111*  BUN 52* 39*  CREATININE 1.04 0.95  CALCIUM 8.6* 8.5*  MG 2.5*  --      Microbiology Results  Results for orders placed or performed during the hospital encounter of 04/11/19  SARS CORONAVIRUS 2 (TAT 6-12 HRS) Nasal Swab Aptima Multi Swab     Status: None   Collection Time: 04/11/19  5:48 PM   Specimen: Aptima Multi Swab; Nasal Swab  Result Value Ref Range Status   SARS Coronavirus 2 NEGATIVE NEGATIVE Final    Comment: (NOTE) SARS-CoV-2 target nucleic acids are  NOT DETECTED. The SARS-CoV-2 RNA is generally detectable in upper and lower respiratory specimens during the acute phase of infection. Negative results do not preclude SARS-CoV-2 infection, do not rule out co-infections with other pathogens, and should not be used as the sole basis for treatment or other patient management decisions. Negative results must be combined with clinical observations, patient history, and epidemiological information. The expected result is Negative. Fact Sheet for Patients: SugarRoll.be Fact Sheet for Healthcare Providers: https://www.woods-mathews.com/ This test is not yet approved or cleared by the [place] FDA and  has been authorized for detection and/or diagnosis of SARS-CoV-2 by FDA under an Emergency Use Authorization (EUA). This EUA will remain  in effect (meaning this test can be used) for the duration of the COVID-19 declaration under Section 56 4(b)(1) of the Act, 21 U.S.C. section 360bbb-3(b)(1), unless the authorization is terminated or revoked sooner. Performed at [facility], [place] [address], [place], [place] [address]   Novel Coronavirus, NAA (hospital order; send-out to ref lab)     Status: None   Collection Time: 04/17/19 10:49 AM   Specimen: Nasopharyngeal Swab; Respiratory  Result Value Ref Range Status   SARS-CoV-2, NAA NOT DETECTED NOT DETECTED Final    Comment: (NOTE) This nucleic acid amplification test was developed and its perfomance characteristics determined by [employer]. Nucleic acid amplification tests include PCR and TMA. This test has not been FDA cleared or approved. This test has been authorized by FDA under an Emergency Use Authorization (EUA). This test is only authorized for the duration of time the declaration that circumstances exist justifying the authorization of the emergency use of in vitro diagnostic tests for detection of SARS-CoV-2 virus and/or  diagnosis of COVID-19 infection under section 564(b)(1) of the Act, 21 U.S.C. [id number]) (1), unless the authorization is terminated or revoked sooner. When diagnostic testing is negative, the possibility of a false negative result should be considered in the context of a patient's recent exposures and the presence of clinical signs and symptoms consistent with COVID-19. An individual without symptoms of COVID- 19 and who is not shedding SARS-CoV-2 vir [place] would expect to have a negative (not detected) result in this assay. Performed At: [facility] [address] [place], [place] [id number] Rush Farmer MD [id number]    [place]  Final    Comment: Performed at [facility], [address] [place], [place] [address]  Urine Culture     Status: Abnormal   Collection Time: 04/21/19  1:20 PM   Specimen: Urine, Random  Result Value Ref Range Status   Specimen Description   Final    URINE, RANDOM Performed at [facility], [place], [place]  [address]    Special Requests   Final    NONE Performed at [facility], [place], [place] [address]    Culture (A)  Final    >=100,000 COLONIES/mL ESCHERICHIA COLI 50,000 COLONIES/mL ENTEROCOCCUS FAECALIS    Report Status 04/25/2019 FINAL  Final   Organism ID, Bacteria ESCHERICHIA COLI (A)  Final   Organism ID, Bacteria ENTEROCOCCUS FAECALIS (A)  Final      Susceptibility   Escherichia coli - MIC*    AMPICILLIN <=2 SENSITIVE Sensitive     CEFAZOLIN <=4 SENSITIVE Sensitive     CEFTRIAXONE <=1 SENSITIVE Sensitive     CIPROFLOXACIN <=0.25 SENSITIVE Sensitive     GENTAMICIN <=1 SENSITIVE Sensitive     IMIPENEM <=0.25 SENSITIVE Sensitive     NITROFURANTOIN <=16 SENSITIVE Sensitive     TRIMETH/SULFA <=20 SENSITIVE Sensitive     AMPICILLIN/SULBACTAM <=2 SENSITIVE Sensitive     PIP/TAZO <=4 SENSITIVE Sensitive     Extended ESBL NEGATIVE Sensitive      * >=100,000 COLONIES/mL ESCHERICHIA COLI   Enterococcus faecalis - MIC*    AMPICILLIN <=2 SENSITIVE Sensitive     LEVOFLOXACIN 2 SENSITIVE Sensitive     NITROFURANTOIN <=16 SENSITIVE Sensitive     VANCOMYCIN 2 SENSITIVE Sensitive     * 50,000 COLONIES/mL ENTEROCOCCUS FAECALIS    RADIOLOGY:  No results found.   Management plans discussed with the patient, family and they are in agreement.  CODE STATUS: DNR   TOTAL TIME TAKING CARE OF THIS PATIENT: 45 minutes.    Berna Spare Mayo M.D on 05/04/2019 at 9:37 AM  Between 7am to 6pm - Pager - [phone]  After 6pm go to [email or web address] - Proofreader  [facility]  Office  [phone]  CC: Primary care physician; Marinda Elk, MD   Note: This dictation was prepared with Dragon dictation along with smaller phrase technology. Any transcriptional errors that result from this process are unintentional.

## 2019-05-04 NOTE — Plan of Care (Signed)
  Problem: Clinical Measurements: Goal: Ability to maintain clinical measurements within normal limits will improve Outcome: Progressing   Problem: Clinical Measurements: Goal: Respiratory complications will improve Outcome: Progressing   Problem: Clinical Measurements: Goal: Cardiovascular complication will be avoided Outcome: Progressing   Problem: Nutrition: Goal: Adequate nutrition will be maintained Outcome: Progressing   Problem: Cardiac: Goal: Ability to achieve and maintain adequate cardiopulmonary perfusion will improve Outcome: Progressing

## 2019-05-04 NOTE — Progress Notes (Signed)
Report called to Lola, Therapist, sports at [facility], EMS called for transport.

## 2019-05-04 NOTE — Progress Notes (Signed)
EMS is here to pick up patient. Previous RN called report. Tried to call multiple times to update SNF about his covid result being negative. Discharge packet and instructions given to EMS. Discontinue PIV and telemetry monitor.

## 2019-05-05 DIAGNOSIS — D649 Anemia, unspecified: Secondary | ICD-10-CM | POA: Diagnosis not present

## 2019-05-05 DIAGNOSIS — I10 Essential (primary) hypertension: Secondary | ICD-10-CM | POA: Diagnosis not present

## 2019-05-05 DIAGNOSIS — E039 Hypothyroidism, unspecified: Secondary | ICD-10-CM | POA: Diagnosis not present

## 2019-05-05 DIAGNOSIS — E0590 Thyrotoxicosis, unspecified without thyrotoxic crisis or storm: Secondary | ICD-10-CM | POA: Diagnosis not present

## 2019-05-07 DIAGNOSIS — I480 Paroxysmal atrial fibrillation: Secondary | ICD-10-CM | POA: Diagnosis not present

## 2019-05-07 DIAGNOSIS — J42 Unspecified chronic bronchitis: Secondary | ICD-10-CM | POA: Diagnosis not present

## 2019-05-07 DIAGNOSIS — J449 Chronic obstructive pulmonary disease, unspecified: Secondary | ICD-10-CM | POA: Diagnosis not present

## 2019-05-07 DIAGNOSIS — E039 Hypothyroidism, unspecified: Secondary | ICD-10-CM | POA: Diagnosis not present

## 2019-05-07 DIAGNOSIS — I5022 Chronic systolic (congestive) heart failure: Secondary | ICD-10-CM | POA: Diagnosis not present

## 2019-05-07 DIAGNOSIS — I779 Disorder of arteries and arterioles, unspecified: Secondary | ICD-10-CM | POA: Diagnosis not present

## 2019-05-08 ENCOUNTER — Other Ambulatory Visit: Payer: Self-pay

## 2019-05-08 ENCOUNTER — Encounter: Payer: Self-pay | Admitting: Family

## 2019-05-08 ENCOUNTER — Ambulatory Visit: Payer: Medicare HMO | Admitting: Family

## 2019-05-08 VITALS — BP 92/60 | HR 69 | Resp 18 | Ht 73.0 in | Wt 159.1 lb

## 2019-05-08 DIAGNOSIS — R319 Hematuria, unspecified: Secondary | ICD-10-CM | POA: Diagnosis not present

## 2019-05-08 DIAGNOSIS — Z7401 Bed confinement status: Secondary | ICD-10-CM | POA: Diagnosis not present

## 2019-05-08 DIAGNOSIS — M2550 Pain in unspecified joint: Secondary | ICD-10-CM | POA: Diagnosis not present

## 2019-05-08 DIAGNOSIS — Z7189 Other specified counseling: Secondary | ICD-10-CM | POA: Diagnosis not present

## 2019-05-08 DIAGNOSIS — Z7951 Long term (current) use of inhaled steroids: Secondary | ICD-10-CM | POA: Diagnosis not present

## 2019-05-08 DIAGNOSIS — Z87891 Personal history of nicotine dependence: Secondary | ICD-10-CM | POA: Insufficient documentation

## 2019-05-08 DIAGNOSIS — N179 Acute kidney failure, unspecified: Secondary | ICD-10-CM | POA: Diagnosis not present

## 2019-05-08 DIAGNOSIS — G9341 Metabolic encephalopathy: Secondary | ICD-10-CM | POA: Diagnosis not present

## 2019-05-08 DIAGNOSIS — R0689 Other abnormalities of breathing: Secondary | ICD-10-CM | POA: Diagnosis not present

## 2019-05-08 DIAGNOSIS — H409 Unspecified glaucoma: Secondary | ICD-10-CM | POA: Insufficient documentation

## 2019-05-08 DIAGNOSIS — R918 Other nonspecific abnormal finding of lung field: Secondary | ICD-10-CM | POA: Diagnosis not present

## 2019-05-08 DIAGNOSIS — I5023 Acute on chronic systolic (congestive) heart failure: Secondary | ICD-10-CM | POA: Diagnosis not present

## 2019-05-08 DIAGNOSIS — R062 Wheezing: Secondary | ICD-10-CM | POA: Diagnosis not present

## 2019-05-08 DIAGNOSIS — Z8249 Family history of ischemic heart disease and other diseases of the circulatory system: Secondary | ICD-10-CM | POA: Insufficient documentation

## 2019-05-08 DIAGNOSIS — I5022 Chronic systolic (congestive) heart failure: Secondary | ICD-10-CM

## 2019-05-08 DIAGNOSIS — J449 Chronic obstructive pulmonary disease, unspecified: Secondary | ICD-10-CM | POA: Insufficient documentation

## 2019-05-08 DIAGNOSIS — Z7901 Long term (current) use of anticoagulants: Secondary | ICD-10-CM | POA: Insufficient documentation

## 2019-05-08 DIAGNOSIS — I4821 Permanent atrial fibrillation: Secondary | ICD-10-CM | POA: Diagnosis not present

## 2019-05-08 DIAGNOSIS — Z66 Do not resuscitate: Secondary | ICD-10-CM | POA: Diagnosis not present

## 2019-05-08 DIAGNOSIS — D649 Anemia, unspecified: Secondary | ICD-10-CM | POA: Diagnosis not present

## 2019-05-08 DIAGNOSIS — N390 Urinary tract infection, site not specified: Secondary | ICD-10-CM | POA: Diagnosis not present

## 2019-05-08 DIAGNOSIS — R069 Unspecified abnormalities of breathing: Secondary | ICD-10-CM | POA: Diagnosis not present

## 2019-05-08 DIAGNOSIS — E860 Dehydration: Secondary | ICD-10-CM | POA: Diagnosis present

## 2019-05-08 DIAGNOSIS — Z79899 Other long term (current) drug therapy: Secondary | ICD-10-CM | POA: Insufficient documentation

## 2019-05-08 DIAGNOSIS — I890 Lymphedema, not elsewhere classified: Secondary | ICD-10-CM

## 2019-05-08 DIAGNOSIS — E039 Hypothyroidism, unspecified: Secondary | ICD-10-CM | POA: Insufficient documentation

## 2019-05-08 DIAGNOSIS — Z950 Presence of cardiac pacemaker: Secondary | ICD-10-CM | POA: Insufficient documentation

## 2019-05-08 DIAGNOSIS — R0602 Shortness of breath: Secondary | ICD-10-CM | POA: Insufficient documentation

## 2019-05-08 DIAGNOSIS — R404 Transient alteration of awareness: Secondary | ICD-10-CM | POA: Diagnosis not present

## 2019-05-08 DIAGNOSIS — N170 Acute kidney failure with tubular necrosis: Secondary | ICD-10-CM | POA: Diagnosis not present

## 2019-05-08 DIAGNOSIS — Z515 Encounter for palliative care: Secondary | ICD-10-CM | POA: Diagnosis not present

## 2019-05-08 DIAGNOSIS — Z79890 Hormone replacement therapy (postmenopausal): Secondary | ICD-10-CM | POA: Diagnosis not present

## 2019-05-08 DIAGNOSIS — Z20828 Contact with and (suspected) exposure to other viral communicable diseases: Secondary | ICD-10-CM | POA: Diagnosis not present

## 2019-05-08 DIAGNOSIS — E161 Other hypoglycemia: Secondary | ICD-10-CM | POA: Diagnosis not present

## 2019-05-08 DIAGNOSIS — I10 Essential (primary) hypertension: Secondary | ICD-10-CM

## 2019-05-08 DIAGNOSIS — R609 Edema, unspecified: Secondary | ICD-10-CM | POA: Insufficient documentation

## 2019-05-08 DIAGNOSIS — R5383 Other fatigue: Secondary | ICD-10-CM | POA: Insufficient documentation

## 2019-05-08 DIAGNOSIS — M1A9XX0 Chronic gout, unspecified, without tophus (tophi): Secondary | ICD-10-CM | POA: Diagnosis present

## 2019-05-08 DIAGNOSIS — R5381 Other malaise: Secondary | ICD-10-CM | POA: Diagnosis not present

## 2019-05-08 DIAGNOSIS — J9691 Respiratory failure, unspecified with hypoxia: Secondary | ICD-10-CM | POA: Diagnosis not present

## 2019-05-08 DIAGNOSIS — E162 Hypoglycemia, unspecified: Secondary | ICD-10-CM | POA: Diagnosis not present

## 2019-05-08 DIAGNOSIS — I482 Chronic atrial fibrillation, unspecified: Secondary | ICD-10-CM | POA: Insufficient documentation

## 2019-05-08 DIAGNOSIS — E785 Hyperlipidemia, unspecified: Secondary | ICD-10-CM | POA: Diagnosis present

## 2019-05-08 DIAGNOSIS — I110 Hypertensive heart disease with heart failure: Secondary | ICD-10-CM | POA: Insufficient documentation

## 2019-05-08 NOTE — Patient Instructions (Signed)
Continue weighing daily and call for an overnight weight gain of > 2 pounds or a weekly weight gain of >5 pounds. 

## 2019-05-08 NOTE — Progress Notes (Signed)
Patient ID: Gabriel Delacruz, male    DOB: [birth year], 86 y.o.   MRN: [id number]  HPI  Gabriel Delacruz is a 86 y/o male with a history of hyperlipidemia, atrial fibrillation, HTN, thyroid disease, GERD, anemia, COPD, previous tobacco use and chronic heart failure.   Echo report from 04/12/2019 reviewed and showed an EF of 25-30% along with severe Gabriel/ TR.   Admitted 04/11/2019 due to new onset HF. Cardiology, nephrology, neurology and palliative care consults were obtained. Head CT was done which showed a stroke. Discharged after 23 days to SNF.   He presents today for his initial visit with a chief complaint of moderate fatigue upon minimal exertion. His daughter describes this as having been present for several months but does seem worse since hospital discharge. He has associated shortness of breath, pedal edema and possible memory loss. He denies any difficulty sleeping, dizziness, abdominal distention, chest pain, cough or palpitations.   Patient says that he's being weighed but he's unsure of whether it's daily or not. Says that he's eating "ok". Patient's daughter says that his blood pressure "always runs low".  Past Medical History:  Diagnosis Date  . Anemia   . Atrial fibrillation ([place])   . CHF (congestive heart failure) ([place])   . COPD (chronic obstructive pulmonary disease) ([place])   . GERD (gastroesophageal reflux disease)   . Glaucoma   . Hyperlipemia   . Hypertension   . Hypothyroidism   . Irregular heart rhythm   . Left anterior fascicular block   . MGUS (monoclonal gammopathy of unknown significance)   . RBBB    Past Surgical History:  Procedure Laterality Date  . Cervical and lumbar disc surgeries    . Cryoablation left renal mass    . PACEMAKER LEADLESS INSERTION N/A 10/16/2018   Procedure: PACEMAKER LEADLESS INSERTION;  Surgeon: Isaias Cowman, MD;  Location: [facility];  Service: Cardiovascular;  Laterality: N/A;  . TEMPORARY PACEMAKER Left 10/16/2018    Procedure: TEMPORARY PACEMAKER;  Surgeon: Isaias Cowman, MD;  Location: [facility];  Service: Cardiovascular;  Laterality: Left;  . TONSILLECTOMY     Family History  Problem Relation Age of Onset  . Hypertension Mother   . Hypertension Father    Social History   Tobacco Use  . Smoking status: Former Research scientist (life sciences)  . Smokeless tobacco: Never Used  Substance Use Topics  . Alcohol use: Not Currently   No Known Allergies Prior to Admission medications   Medication Sig Start Date End Date Taking? Authorizing Provider  apixaban (ELIQUIS) 5 MG TABS tablet Take 1 tablet (5 mg total) by mouth 2 (two) times daily. 05/04/19  Yes Mayo, Pete Pelt, MD  atorvastatin (LIPITOR) 20 MG tablet Take 1 tablet (20 mg total) by mouth daily at 6 PM. 05/04/19  Yes Mayo, Pete Pelt, MD  carvedilol (COREG) 3.125 MG tablet Take 1 tablet (3.125 mg total) by mouth 2 (two) times daily with a meal. 05/04/19  Yes Mayo, Pete Pelt, MD  docusate sodium (COLACE) 100 MG capsule Take 1 capsule (100 mg total) by mouth 2 (two) times daily. 05/04/19  Yes Mayo, Pete Pelt, MD  dorzolamide-timolol (COSOPT) 22.3-6.8 MG/ML ophthalmic solution Place 1 drop into both eyes 2 (two) times daily.    Yes [provider]  feeding supplement, ENSURE ENLIVE, (ENSURE ENLIVE) LIQD Take 237 mLs by mouth 2 (two) times daily between meals. 05/04/19  Yes Mayo, Pete Pelt, MD  Fluticasone-Salmeterol (ADVAIR DISKUS) 250-50 MCG/DOSE AEPB Inhale 1 puff  into the lungs daily.    Yes [provider]  furosemide (LASIX) 40 MG tablet Take 1 tablet (40 mg total) by mouth daily. 10/19/18  Yes Bettey Costa, MD  levothyroxine (SYNTHROID) 88 MCG tablet Take 1 tablet (88 mcg total) by mouth daily at 6 (six) AM. 05/05/19  Yes Mayo, Pete Pelt, MD  multivitamin-iron-minerals-folic acid (CENTRUM) chewable tablet Chew 1 tablet by mouth daily.    Yes [provider]  potassium chloride (K-DUR) 10 MEQ tablet Take 10 mEq by mouth daily.    Yes  [provider]  Travoprost, BAK Free, (TRAVATAN Z) 0.004 % SOLN ophthalmic solution PLACE 1 DROP INTO BOTH EYES ONCE DAILY 01/25/18  Yes [provider]  albuterol (PROAIR HFA) 108 (90 BASE) MCG/ACT inhaler Inhale 2 puffs into the lungs every 6 (six) hours as needed.     [provider]    Review of Systems  Constitutional: Positive for fatigue. Negative for appetite change.  HENT: Positive for hearing loss. Negative for congestion, rhinorrhea and sore throat.   Eyes: Negative.   Respiratory: Positive for shortness of breath. Negative for cough.   Cardiovascular: Positive for leg swelling. Negative for chest pain and palpitations.  Gastrointestinal: Negative for abdominal distention and abdominal pain.  Endocrine: Negative.   Genitourinary: Negative.   Musculoskeletal: Negative for back pain and neck pain.  Skin: Negative.   Allergic/Immunologic: Negative.   Neurological: Negative for dizziness and light-headedness.       Possible memory loss per daughter  Hematological: Negative for adenopathy. Does not bruise/bleed easily.  Psychiatric/Behavioral: Negative for dysphoric mood and sleep disturbance. The patient is not nervous/anxious.     Vitals:   05/08/19 1140 05/08/19 1157  BP: (!) 87/56 92/60  Pulse: 69   Resp: 18   Weight: 159 lb 2 oz (72.2 kg)   Height: 6\' 1"  (1.854 m)    Wt Readings from Last 3 Encounters:  05/08/19 159 lb 2 oz (72.2 kg)  05/04/19 161 lb 2.5 oz (73.1 kg)  10/19/18 177 lb 0.5 oz (80.3 kg)   Lab Results  Component Value Date   CREATININE 0.95 05/02/2019   CREATININE 1.04 04/30/2019   CREATININE 1.38 (H) 04/29/2019    Physical Exam Vitals signs and nursing note reviewed.  Constitutional:      Appearance: Normal appearance.  HENT:     Head: Normocephalic and atraumatic.     Right Ear: Decreased hearing noted.     Left Ear: Decreased hearing noted.  Neck:     Musculoskeletal: Normal range of motion and neck supple.   Cardiovascular:     Rate and Rhythm: Normal rate and regular rhythm.  Pulmonary:     Effort: Pulmonary effort is normal. No respiratory distress.     Breath sounds: No wheezing or rales.  Abdominal:     General: Abdomen is flat. There is no distension.     Palpations: Abdomen is soft.  Musculoskeletal:        General: No tenderness.     Right lower leg: Edema (1+ pitting) present.     Left lower leg: Edema (1+ pitting) present.  Skin:    General: Skin is warm and dry.  Neurological:     General: No focal deficit present.     Mental Status: He is alert and oriented to person, place, and time.  Psychiatric:        Mood and Affect: Mood normal.        Behavior: Behavior normal.  Assessment & Plan:  1: Chronic heart failure with reduced ejection fraction- - NYHA class III - euvolemic today - order written for him to be weighed daily and to call for an overnight weight gain of >2 pounds or a weekly weight gain of >5 pounds - not adding salt and is currently living at [facility] - saw cardiology ([place]) 04/09/2019 - BP will limit the ability to use HF evidence based medications - pacemaker in place for chronic atrial fibrillation - BNP 04/11/2019 was 2019.0  2: HTN- - BP low even on recheck with manual cuff and his daughter says that his BP is always low - saw PCP Carrie Mew) 03/21/2019 - BMP from 05/02/2019 reviewed and showed sodium 139, potassium 3.7, creatinine 0.95 and GFR >60  3: COPD- - wearing oxygen at 2L around the clock and his daughter says that this is new for him - lungs clear  4: Lymphedema- - stage 2 - elevating his legs when sitting for long periods of time - limited in his ability to exercise due to extreme fatigue - order written for patient to have compression socks put on in the morning with removal at bedtime - consider lymphapress compression boots if edema persists  Facility medication list was reviewed.   Return in 2 months or sooner for any  questions/problems before then.

## 2019-05-09 DIAGNOSIS — N390 Urinary tract infection, site not specified: Secondary | ICD-10-CM | POA: Diagnosis not present

## 2019-05-09 DIAGNOSIS — R319 Hematuria, unspecified: Secondary | ICD-10-CM | POA: Diagnosis not present

## 2019-05-09 DIAGNOSIS — J9691 Respiratory failure, unspecified with hypoxia: Secondary | ICD-10-CM | POA: Diagnosis not present

## 2019-05-09 DIAGNOSIS — D649 Anemia, unspecified: Secondary | ICD-10-CM | POA: Diagnosis not present

## 2019-05-11 ENCOUNTER — Emergency Department: Payer: Medicare HMO

## 2019-05-11 ENCOUNTER — Inpatient Hospital Stay
Admission: EM | Admit: 2019-05-11 | Discharge: 2019-05-16 | DRG: 682 | Disposition: A | Discharge status: Hospice | Payer: Medicare HMO | Source: Skilled Nursing Facility | Attending: Internal Medicine | Admitting: Internal Medicine

## 2019-05-11 ENCOUNTER — Other Ambulatory Visit: Payer: Self-pay

## 2019-05-11 DIAGNOSIS — E162 Hypoglycemia, unspecified: Secondary | ICD-10-CM | POA: Diagnosis not present

## 2019-05-11 DIAGNOSIS — I4821 Permanent atrial fibrillation: Secondary | ICD-10-CM | POA: Diagnosis present

## 2019-05-11 DIAGNOSIS — Z20828 Contact with and (suspected) exposure to other viral communicable diseases: Secondary | ICD-10-CM | POA: Diagnosis present

## 2019-05-11 DIAGNOSIS — E161 Other hypoglycemia: Secondary | ICD-10-CM | POA: Diagnosis not present

## 2019-05-11 DIAGNOSIS — M1A9XX0 Chronic gout, unspecified, without tophus (tophi): Secondary | ICD-10-CM | POA: Diagnosis present

## 2019-05-11 DIAGNOSIS — H409 Unspecified glaucoma: Secondary | ICD-10-CM | POA: Diagnosis present

## 2019-05-11 DIAGNOSIS — M2550 Pain in unspecified joint: Secondary | ICD-10-CM | POA: Diagnosis not present

## 2019-05-11 DIAGNOSIS — E860 Dehydration: Secondary | ICD-10-CM | POA: Diagnosis present

## 2019-05-11 DIAGNOSIS — Z66 Do not resuscitate: Secondary | ICD-10-CM | POA: Diagnosis present

## 2019-05-11 DIAGNOSIS — G9341 Metabolic encephalopathy: Secondary | ICD-10-CM | POA: Diagnosis present

## 2019-05-11 DIAGNOSIS — Z87891 Personal history of nicotine dependence: Secondary | ICD-10-CM | POA: Diagnosis not present

## 2019-05-11 DIAGNOSIS — J449 Chronic obstructive pulmonary disease, unspecified: Secondary | ICD-10-CM | POA: Diagnosis present

## 2019-05-11 DIAGNOSIS — Z79890 Hormone replacement therapy (postmenopausal): Secondary | ICD-10-CM | POA: Diagnosis not present

## 2019-05-11 DIAGNOSIS — Z7401 Bed confinement status: Secondary | ICD-10-CM | POA: Diagnosis not present

## 2019-05-11 DIAGNOSIS — N179 Acute kidney failure, unspecified: Secondary | ICD-10-CM | POA: Diagnosis present

## 2019-05-11 DIAGNOSIS — Z79899 Other long term (current) drug therapy: Secondary | ICD-10-CM

## 2019-05-11 DIAGNOSIS — I890 Lymphedema, not elsewhere classified: Secondary | ICD-10-CM | POA: Diagnosis present

## 2019-05-11 DIAGNOSIS — D649 Anemia, unspecified: Secondary | ICD-10-CM | POA: Diagnosis present

## 2019-05-11 DIAGNOSIS — N170 Acute kidney failure with tubular necrosis: Secondary | ICD-10-CM | POA: Diagnosis present

## 2019-05-11 DIAGNOSIS — R404 Transient alteration of awareness: Secondary | ICD-10-CM | POA: Diagnosis not present

## 2019-05-11 DIAGNOSIS — I110 Hypertensive heart disease with heart failure: Secondary | ICD-10-CM | POA: Diagnosis present

## 2019-05-11 DIAGNOSIS — Z515 Encounter for palliative care: Secondary | ICD-10-CM | POA: Diagnosis not present

## 2019-05-11 DIAGNOSIS — I5023 Acute on chronic systolic (congestive) heart failure: Secondary | ICD-10-CM

## 2019-05-11 DIAGNOSIS — E785 Hyperlipidemia, unspecified: Secondary | ICD-10-CM | POA: Diagnosis present

## 2019-05-11 DIAGNOSIS — R5381 Other malaise: Secondary | ICD-10-CM | POA: Diagnosis not present

## 2019-05-11 DIAGNOSIS — Z950 Presence of cardiac pacemaker: Secondary | ICD-10-CM

## 2019-05-11 DIAGNOSIS — Z7951 Long term (current) use of inhaled steroids: Secondary | ICD-10-CM | POA: Diagnosis not present

## 2019-05-11 DIAGNOSIS — Z8249 Family history of ischemic heart disease and other diseases of the circulatory system: Secondary | ICD-10-CM

## 2019-05-11 DIAGNOSIS — Z7901 Long term (current) use of anticoagulants: Secondary | ICD-10-CM | POA: Diagnosis not present

## 2019-05-11 DIAGNOSIS — Z7189 Other specified counseling: Secondary | ICD-10-CM | POA: Diagnosis not present

## 2019-05-11 DIAGNOSIS — E039 Hypothyroidism, unspecified: Secondary | ICD-10-CM | POA: Diagnosis present

## 2019-05-11 DIAGNOSIS — R0689 Other abnormalities of breathing: Secondary | ICD-10-CM | POA: Diagnosis not present

## 2019-05-11 DIAGNOSIS — R062 Wheezing: Secondary | ICD-10-CM | POA: Diagnosis not present

## 2019-05-11 DIAGNOSIS — R069 Unspecified abnormalities of breathing: Secondary | ICD-10-CM | POA: Diagnosis not present

## 2019-05-11 DIAGNOSIS — R918 Other nonspecific abnormal finding of lung field: Secondary | ICD-10-CM | POA: Diagnosis not present

## 2019-05-11 DIAGNOSIS — R609 Edema, unspecified: Secondary | ICD-10-CM | POA: Diagnosis not present

## 2019-05-11 LAB — BLOOD GAS, ARTERIAL
Acid-Base Excess: 4.2 mmol/L — ABNORMAL HIGH (ref 0.0–2.0)
Bicarbonate: 27.3 mmol/L (ref 20.0–28.0)
FIO2: 28
O2 Saturation: 97.9 %
Patient temperature: 37
pCO2 arterial: 35 mmHg (ref 32.0–48.0)
pH, Arterial: 7.5 — ABNORMAL HIGH (ref 7.350–7.450)
pO2, Arterial: 94 mmHg (ref 83.0–108.0)

## 2019-05-11 LAB — CBC WITH DIFFERENTIAL/PLATELET
Abs Immature Granulocytes: 0.03 10*3/uL (ref 0.00–0.07)
Basophils Absolute: 0 10*3/uL (ref 0.0–0.1)
Basophils Relative: 1 %
Eosinophils Absolute: 0 10*3/uL (ref 0.0–0.5)
Eosinophils Relative: 1 %
HCT: 45 % (ref 39.0–52.0)
Hemoglobin: 15.3 g/dL (ref 13.0–17.0)
Immature Granulocytes: 1 %
Lymphocytes Relative: 37 %
Lymphs Abs: 2.4 10*3/uL (ref 0.7–4.0)
MCH: 36 pg — ABNORMAL HIGH (ref 26.0–34.0)
MCHC: 34 g/dL (ref 30.0–36.0)
MCV: 105.9 fL — ABNORMAL HIGH (ref 80.0–100.0)
Monocytes Absolute: 0.5 10*3/uL (ref 0.1–1.0)
Monocytes Relative: 7 %
Neutro Abs: 3.5 10*3/uL (ref 1.7–7.7)
Neutrophils Relative %: 53 %
Platelets: 108 10*3/uL — ABNORMAL LOW (ref 150–400)
RBC: 4.25 MIL/uL (ref 4.22–5.81)
RDW: 13.7 % (ref 11.5–15.5)
WBC: 6.4 10*3/uL (ref 4.0–10.5)
nRBC: 1.9 % — ABNORMAL HIGH (ref 0.0–0.2)

## 2019-05-11 LAB — BASIC METABOLIC PANEL
Anion gap: 15 (ref 5–15)
BUN: 90 mg/dL — ABNORMAL HIGH (ref 8–23)
CO2: 27 mmol/L (ref 22–32)
Calcium: 9.1 mg/dL (ref 8.9–10.3)
Chloride: 93 mmol/L — ABNORMAL LOW (ref 98–111)
Creatinine, Ser: 2.16 mg/dL — ABNORMAL HIGH (ref 0.61–1.24)
GFR calc Af Amer: 30 mL/min — ABNORMAL LOW (ref >60)
GFR calc non Af Amer: 26 mL/min — ABNORMAL LOW (ref >60)
Glucose, Bld: 82 mg/dL (ref 70–99)
Potassium: 5 mmol/L (ref 3.5–5.1)
Sodium: 135 mmol/L (ref 135–145)

## 2019-05-11 LAB — GLUCOSE, CAPILLARY
Glucose-Capillary: 115 mg/dL — ABNORMAL HIGH (ref 70–99)
Glucose-Capillary: 46 mg/dL — ABNORMAL LOW (ref 70–99)
Glucose-Capillary: 92 mg/dL (ref 70–99)

## 2019-05-11 LAB — BRAIN NATRIURETIC PEPTIDE: B Natriuretic Peptide: 3096 pg/mL — ABNORMAL HIGH (ref 0.0–100.0)

## 2019-05-11 LAB — SARS CORONAVIRUS 2 BY RT PCR (HOSPITAL ORDER, PERFORMED IN ~~LOC~~ HOSPITAL LAB): SARS Coronavirus 2: NEGATIVE

## 2019-05-11 LAB — AMMONIA: Ammonia: 10 umol/L (ref 9–35)

## 2019-05-11 LAB — TROPONIN I (HIGH SENSITIVITY): Troponin I (High Sensitivity): 31 ng/L — ABNORMAL HIGH (ref <18)

## 2019-05-11 MED ORDER — LATANOPROST 0.005 % OP SOLN
1.0000 [drp] | Freq: Every day | OPHTHALMIC | Status: DC
  Administered 2019-05-12 – 2019-05-15 (×4): 1 [drp] via OPHTHALMIC
  Filled 2019-05-11: qty 2.5

## 2019-05-11 MED ORDER — SODIUM CHLORIDE 0.9 % IV SOLN
INTRAVENOUS | Status: DC
  Administered 2019-05-11: 18:00:00 via INTRAVENOUS

## 2019-05-11 MED ORDER — DORZOLAMIDE HCL-TIMOLOL MAL 2-0.5 % OP SOLN
1.0000 [drp] | Freq: Two times a day (BID) | OPHTHALMIC | Status: DC
  Administered 2019-05-12 – 2019-05-16 (×7): 1 [drp] via OPHTHALMIC
  Filled 2019-05-11 (×2): qty 10

## 2019-05-11 MED ORDER — ORAL CARE MOUTH RINSE
15.0000 mL | Freq: Two times a day (BID) | OROMUCOSAL | Status: DC
  Administered 2019-05-13 – 2019-05-16 (×3): 15 mL via OROMUCOSAL

## 2019-05-11 MED ORDER — BISACODYL 5 MG PO TBEC: 5.0000 mg | DELAYED_RELEASE_TABLET | Freq: Every day | ORAL | Status: DC | PRN

## 2019-05-11 MED ORDER — ALBUTEROL SULFATE (2.5 MG/3ML) 0.083% IN NEBU
2.5000 mg | INHALATION_SOLUTION | RESPIRATORY_TRACT | Status: DC | PRN
  Administered 2019-05-11 – 2019-05-14 (×3): 2.5 mg via RESPIRATORY_TRACT
  Filled 2019-05-11 (×3): qty 3

## 2019-05-11 MED ORDER — DEXTROSE 50 % IV SOLN
25.0000 g | INTRAVENOUS | Status: AC
  Administered 2019-05-11: 19:00:00 25 g via INTRAVENOUS

## 2019-05-11 MED ORDER — APIXABAN 5 MG PO TABS
5.0000 mg | ORAL_TABLET | Freq: Two times a day (BID) | ORAL | Status: DC
  Administered 2019-05-11: 5 mg via ORAL
  Filled 2019-05-11 (×2): qty 1

## 2019-05-11 MED ORDER — DEXTROSE 50 % IV SOLN
INTRAVENOUS | Status: AC
  Administered 2019-05-11: 25 g via INTRAVENOUS
  Filled 2019-05-11: qty 50

## 2019-05-11 MED ORDER — IPRATROPIUM-ALBUTEROL 0.5-2.5 (3) MG/3ML IN SOLN
3.0000 mL | Freq: Four times a day (QID) | RESPIRATORY_TRACT | Status: DC
  Administered 2019-05-12 (×2): 3 mL via RESPIRATORY_TRACT
  Filled 2019-05-11 (×2): qty 3

## 2019-05-11 MED ORDER — ONDANSETRON HCL 4 MG PO TABS: 4.0000 mg | ORAL_TABLET | Freq: Four times a day (QID) | ORAL | Status: DC | PRN

## 2019-05-11 MED ORDER — ACETAMINOPHEN 325 MG PO TABS: 650.0000 mg | ORAL_TABLET | Freq: Four times a day (QID) | ORAL | Status: DC | PRN

## 2019-05-11 MED ORDER — ACETAMINOPHEN 650 MG RE SUPP: 650.0000 mg | Freq: Four times a day (QID) | RECTAL | Status: DC | PRN

## 2019-05-11 MED ORDER — METHYLPREDNISOLONE SODIUM SUCC 125 MG IJ SOLR
125.0000 mg | Freq: Once | INTRAMUSCULAR | Status: AC
  Administered 2019-05-11: 125 mg via INTRAVENOUS
  Filled 2019-05-11: qty 2

## 2019-05-11 MED ORDER — FUROSEMIDE 10 MG/ML IJ SOLN
40.0000 mg | Freq: Once | INTRAMUSCULAR | Status: AC
  Administered 2019-05-11: 40 mg via INTRAVENOUS
  Filled 2019-05-11: qty 4

## 2019-05-11 MED ORDER — ONDANSETRON HCL 4 MG/2ML IJ SOLN: 4.0000 mg | Freq: Four times a day (QID) | INTRAMUSCULAR | Status: DC | PRN

## 2019-05-11 MED ORDER — SODIUM CHLORIDE 0.9 % IV SOLN: INTRAVENOUS | Status: DC

## 2019-05-11 NOTE — Progress Notes (Signed)
Spoke with patient's daughter, according to her he is more lethargic for the past 2 days, will do ABG and see if patient needs BiPAP.

## 2019-05-11 NOTE — Progress Notes (Addendum)
Subjective: Primary nurse reports CCMD called and reported that pt just have a ST II 4.3, ST III 4.7 and ST aVF 4.5 elevation.  Brief HPI: 85 year old with extensive cardiac history including atrial fibrillation with pacemaker, CHF, COPD, RBBB, left anterior fascicular block, hypertension, hyperlipidemia, and hypothyroidism admitted with acute metabolic encephalopathy secondary to dehydration.  Plan: -Given current multiple medical problems with poor prognosis and advanced age will hold off further testing at this time.  Patient with known extensive cardiac hx, he remains asymptomatic.  We will continue with current medical management.   Rufina Falco, DNP, CCRN, FNP-BC [facility] Nurse Practitioner Between 7am to 6pm - Pager [phone]  After 6pm go to [email or web address] - Proofreader  [employer]  [phone]

## 2019-05-11 NOTE — ED Notes (Signed)
Pt placed on 2L [place] due to fluctuation in oxygen saturation at this time

## 2019-05-11 NOTE — Progress Notes (Addendum)
CCMD called and reported that pt just have a ST II 4.3, ST III 4.7 and ST aVF 4.5 elevation. Pt asymptomatic. Page prime. Awaiting callback. Will continue to monitor.  Update 2227: Talked to [facility] NP and states will place order. Will continue to monitor.  Update 2300: Please refer notes by [place] NP 05/11/2019 2230. Will continue to monitor.

## 2019-05-11 NOTE — ED Notes (Signed)
Notified by lab 2nd light green tube also hemolyzed. Phlebotomy draw requested.

## 2019-05-11 NOTE — ED Provider Notes (Signed)
[facility] Emergency Department Provider Note       Time seen: ----------------------------------------- 10:15 AM on 05/11/2019 ----------------------------------------- Level V caveat: History/ROS limited by altered mental status  I have reviewed the triage vital signs and the nursing notes.  HISTORY   Chief Complaint No chief complaint on file.   HPI Gabriel Delacruz is a 85 y.o. male with a history of anemia, atrial fibrillation, CHF, COPD, hypertension, hyperlipidemia, DO NOT RESUSCITATE who presents to the ED for altered mental status and edema.  Patient was seen here from his residence with worsening peripheral edema, increased shortness of breath and altered mental status.  It is unclear how long this is been going on.  Past Medical History:  Diagnosis Date  . Anemia   . Atrial fibrillation ([place])   . CHF (congestive heart failure) ([place])   . COPD (chronic obstructive pulmonary disease) ([place])   . GERD (gastroesophageal reflux disease)   . Glaucoma   . Hyperlipemia   . Hypertension   . Hypothyroidism   . Irregular heart rhythm   . Left anterior fascicular block   . MGUS (monoclonal gammopathy of unknown significance)   . RBBB     Patient Active Problem List   Diagnosis Date Noted  . Pressure injury of skin 04/28/2019  . Cough   . Goals of care, counseling/discussion   . Palliative care by specialist   . DNR (do not resuscitate) discussion   . Acute exacerbation of CHF (congestive heart failure) ([place]) 04/11/2019  . Severe sinus bradycardia 10/13/2018  . MGUS (monoclonal gammopathy of unknown significance) 12/11/2015    Past Surgical History:  Procedure Laterality Date  . Cervical and lumbar disc surgeries    . Cryoablation left renal mass    . PACEMAKER LEADLESS INSERTION N/A 10/16/2018   Procedure: PACEMAKER LEADLESS INSERTION;  Surgeon: Isaias Cowman, MD;  Location: [facility];  Service: Cardiovascular;   Laterality: N/A;  . TEMPORARY PACEMAKER Left 10/16/2018   Procedure: TEMPORARY PACEMAKER;  Surgeon: Isaias Cowman, MD;  Location: [place] [facility];  Service: Cardiovascular;  Laterality: Left;  . TONSILLECTOMY      Allergies Patient has no known allergies.  Social History Social History   Tobacco Use  . Smoking status: Former Research scientist (life sciences)  . Smokeless tobacco: Never Used  Substance Use Topics  . Alcohol use: Not Currently  . Drug use: Not Currently    Review of Systems Unknown, reported edema, altered mental status and dyspnea  All systems negative/normal/unremarkable except as stated in the HPI  ____________________________________________   PHYSICAL EXAM:  VITAL SIGNS: ED Triage Vitals  Enc Vitals Group     BP      Pulse      Resp      Temp      Temp src      SpO2      Weight      Height      Head Circumference      Peak Flow      Pain Score      Pain Loc      Pain Edu?      Excl. in [place]?     Constitutional: Lethargic but arouses to some verbal and painful stimuli.  Chronically ill-appearing, mild distress Eyes: Conjunctivae are normal. Normal extraocular movements. ENT      Head: Normocephalic and atraumatic.      Nose: No congestion/rhinnorhea.      Mouth/Throat: Mucous membranes are moist.  Neck: No stridor. Cardiovascular: Normal rate, regular rhythm. No murmurs, rubs, or gallops. Respiratory: Tachypnea with scattered rales Gastrointestinal: Soft and nontender. Normal bowel sounds Musculoskeletal: Nontender with normal range of motion in extremities.  Bilateral pitting edema is noted Neurologic: Patient appears to be nonverbal, generalized weakness with lethargy Skin:  Skin is warm, dry and intact. No rash noted. Psychiatric: Flat affect, does not cooperate well with examination ____________________________________________  EKG: Interpreted by me.  Ventricular paced rhythm with rate of 70 bpm, normal pacemaker  function  ____________________________________________  ED COURSE:  As part of my medical decision making, I reviewed the following data within the [place] History obtained from family if available, nursing notes, old chart and ekg, as well as notes from prior ED visits. Patient presented for altered mental status and shortness of breath with edema, we will assess with labs and imaging as indicated at this time.   Procedures  Delorse Lek Blitch was evaluated in Emergency Department on 05/11/2019 for the symptoms described in the history of present illness. He was evaluated in the context of the global COVID-19 pandemic, which necessitated consideration that the patient might be at risk for infection with the SARS-CoV-2 virus that causes COVID-19. Institutional protocols and algorithms that pertain to the evaluation of patients at risk for COVID-19 are in a state of rapid change based on information released by regulatory bodies including the CDC and federal and state organizations. These policies and algorithms were followed during the patient's care in the ED.  ____________________________________________   LABS (pertinent positives/negatives)  Labs Reviewed  CBC WITH DIFFERENTIAL/PLATELET - Abnormal; Notable for the following components:      Result Value   MCV 105.9 (*)    MCH 36.0 (*)    Platelets 108 (*)    nRBC 1.9 (*)    All other components within normal limits  BRAIN NATRIURETIC PEPTIDE - Abnormal; Notable for the following components:   B Natriuretic Peptide 3,096.0 (*)    All other components within normal limits  BASIC METABOLIC PANEL - Abnormal; Notable for the following components:   Chloride 93 (*)    BUN 90 (*)    Creatinine, Ser 2.16 (*)    GFR calc non Af Amer 26 (*)    GFR calc Af Amer 30 (*)    All other components within normal limits  TROPONIN I (HIGH SENSITIVITY) - Abnormal; Notable for the following components:   Troponin I (High  Sensitivity) 31 (*)    All other components within normal limits  SARS CORONAVIRUS 2 (HOSPITAL ORDER, [place] [facility])    RADIOLOGY Images were viewed by me  Chest x-ray IMPRESSION:  1. Basilar opacities now partly obscure the hemidiaphragms, mildly  improved from the previous exam, consistent with a combination of  small effusions and atelectasis.  2. No new lung abnormalities. No pulmonary edema.  ____________________________________________   DIFFERENTIAL DIAGNOSIS   CHF, COPD, pneumonia, CVA, MI, renal failure  FINAL ASSESSMENT AND PLAN  CHF exacerbation, acute kidney injury, elevated troponin   Plan: The patient had presented for shortness of breath and worsening peripheral edema and altered mental status. Patient's labs do indicate acute kidney injury with CHF exacerbation and mildly elevated troponin. Patient's imaging x-ray appears to be somewhat improved compared to prior.  Unclear plan as to the best next step for him, he appears to be approaching end-of-life.   Laurence Aly, MD    Note: This note was generated in part  or whole with voice recognition software. Voice recognition is usually quite accurate but there are transcription errors that can and very often do occur. I apologize for any typographical errors that were not detected and corrected.     Earleen Newport, MD 05/11/19 1345

## 2019-05-11 NOTE — ED Triage Notes (Signed)
Pt arrives via ems from [facility]. Ems reports pt has lower extremely edema, increased SOB, facility reported being unable to keep oxygen sat up. Ems states facility nurse reported pt up and walking and talking few days ago. On arrival to ed pt unable to respond verbally. Responsive to painful stimuli. MD at bedside for assessment on arrival. RR elevated. Oxygen sat ranging from 88-95%

## 2019-05-11 NOTE — ED Notes (Signed)
Respiratory notified of ABG order

## 2019-05-11 NOTE — H&P (Signed)
[facility] [place] NAME: Avon Brahmbhatt    MR#:  [id number]  DATE OF BIRTH:  1928/07/05  DATE OF ADMISSION:  05/11/2019  PRIMARY CARE PHYSICIAN: Marinda Elk, MD   REQUESTING/REFERRING PHYSICIAN: Dr. Lenise Arena  CHIEF COMPLAINT: Lethargy   Chief Complaint  Patient presents with  . Shortness of Breath    HISTORY OF PRESENT ILLNESS:  Dameion Yarter  is a 85 y.o. male with a known history of chronic systolic heart failure, permanent atrial fibrillation, hypothyroidism brought from [place] home because of worsening lethargy for the last 3 days.  Patient noted to have acute kidney injury on blood work.  Spoke to patient's daughter, according to her patient was doing okay last 1 week but noted to have lethargy, patient not eating or drinking and not communicating much so brought him here.  Spoke with the patient, patient opens eyes briefly and now drifting back to sleep.  Unable to give any history.  Also noted to have elevated BNP.  PAST MEDICAL HISTORY:   Past Medical History:  Diagnosis Date  . Anemia   . Atrial fibrillation ([place])   . CHF (congestive heart failure) ([place])   . COPD (chronic obstructive pulmonary disease) ([place])   . GERD (gastroesophageal reflux disease)   . Glaucoma   . Hyperlipemia   . Hypertension   . Hypothyroidism   . Irregular heart rhythm   . Left anterior fascicular block   . MGUS (monoclonal gammopathy of unknown significance)   . RBBB     PAST SURGICAL HISTOIRY:   Past Surgical History:  Procedure Laterality Date  . Cervical and lumbar disc surgeries    . Cryoablation left renal mass    . PACEMAKER LEADLESS INSERTION N/A 10/16/2018   Procedure: PACEMAKER LEADLESS INSERTION;  Surgeon: Isaias Cowman, MD;  Location: [facility];  Service: Cardiovascular;  Laterality: N/A;  . TEMPORARY PACEMAKER Left 10/16/2018   Procedure: TEMPORARY PACEMAKER;  Surgeon:  Isaias Cowman, MD;  Location: [facility];  Service: Cardiovascular;  Laterality: Left;  . TONSILLECTOMY      SOCIAL HISTORY:   Social History   Tobacco Use  . Smoking status: Former Research scientist (life sciences)  . Smokeless tobacco: Never Used  Substance Use Topics  . Alcohol use: Not Currently    FAMILY HISTORY:   Family History  Problem Relation Age of Onset  . Hypertension Mother   . Hypertension Father     DRUG ALLERGIES:  No Known Allergies  REVIEW OF SYSTEMS:  .   MEDICATIONS AT HOME:   Prior to Admission medications   Medication Sig Start Date End Date Taking? Authorizing Provider  apixaban (ELIQUIS) 5 MG TABS tablet Take 1 tablet (5 mg total) by mouth 2 (two) times daily. 05/04/19  Yes Mayo, Pete Pelt, MD  atorvastatin (LIPITOR) 20 MG tablet Take 1 tablet (20 mg total) by mouth daily at 6 PM. 05/04/19  Yes Mayo, Pete Pelt, MD  carvedilol (COREG) 3.125 MG tablet Take 1 tablet (3.125 mg total) by mouth 2 (two) times daily with a meal. 05/04/19  Yes Mayo, Pete Pelt, MD  docusate sodium (COLACE) 100 MG capsule Take 1 capsule (100 mg total) by mouth 2 (two) times daily. 05/04/19  Yes Mayo, Pete Pelt, MD  dorzolamide-timolol (COSOPT) 22.3-6.8 MG/ML ophthalmic solution Place 1 drop into both eyes 2 (two) times daily.    Yes [provider]  Fluticasone-Salmeterol (ADVAIR DISKUS) 250-50 MCG/DOSE AEPB Inhale 1  puff into the lungs daily.    Yes [provider]  furosemide (LASIX) 40 MG tablet Take 1 tablet (40 mg total) by mouth daily. 10/19/18  Yes Bettey Costa, MD  levothyroxine (SYNTHROID) 88 MCG tablet Take 1 tablet (88 mcg total) by mouth daily at 6 (six) AM. 05/05/19  Yes Mayo, Pete Pelt, MD  multivitamin-iron-minerals-folic acid (CENTRUM) chewable tablet Chew 1 tablet by mouth daily.    Yes [provider]  potassium chloride (K-DUR) 10 MEQ tablet Take 10 mEq by mouth daily.    Yes [provider]  Travoprost, BAK Free, (TRAVATAN Z) 0.004 % SOLN  ophthalmic solution PLACE 1 DROP INTO BOTH EYES ONCE DAILY 01/25/18  Yes [provider]  albuterol (PROAIR HFA) 108 (90 BASE) MCG/ACT inhaler Inhale 2 puffs into the lungs every 6 (six) hours as needed.     [provider]  feeding supplement, ENSURE ENLIVE, (ENSURE ENLIVE) LIQD Take 237 mLs by mouth 2 (two) times daily between meals. 05/04/19   Mayo, Pete Pelt, MD      VITAL SIGNS:  Blood pressure 112/77, pulse 69, temperature 97.7 F (36.5 C), temperature source Axillary, resp. rate 20, height 6' (1.829 m), weight 72.6 kg, SpO2 97 %.  PHYSICAL EXAMINATION:  GENERAL:  85 y.o.-year-old patient lying in the bed with no acute distress.  EYES: Pupils equal, round, reactive to light and accommodation. No scleral icterus. Extraocular muscles intact.  HEENT: Head atraumatic, normocephalic. Oropharynx and nasopharynx clear.  NECK:  Supple, no jugular venous distention. No thyroid enlargement, no tenderness.  LUNGS: Normal breath sounds bilaterally, no wheezing, rales,rhonchi or crepitation. No use of accessory muscles of respiration.  CARDIOVASCULAR: S1, S2 normal. No murmurs, rubs, or gallops.  ABDOMEN: Soft, nontender, nondistended. Bowel sounds present. No organomegaly or mass.  EXTREMITIES: No pedal edema, cyanosis, or clubbing.  NEUROLOGIC: Cranial nerves II through XII are intact. Muscle strength 5/5 in all extremities. Sensation intact. Gait not checked.  PSYCHIATRIC: The patient is alert and oriented x 3.  SKIN: No obvious rash, lesion, or ulcer.   LABORATORY PANEL:   CBC Recent Labs  Lab 05/11/19 1034  WBC 6.4  HGB 15.3  HCT 45.0  PLT 108*   ------------------------------------------------------------------------------------------------------------------  Chemistries  Recent Labs  Lab 05/11/19 1252  NA 135  K 5.0  CL 93*  CO2 27  GLUCOSE 82  BUN 90*  CREATININE 2.16*  CALCIUM 9.1    ------------------------------------------------------------------------------------------------------------------  Cardiac Enzymes No results for input(s): TROPONINI in the last 168 hours. ------------------------------------------------------------------------------------------------------------------  RADIOLOGY:  Dg Chest 1 View  Result Date: 05/11/2019 CLINICAL DATA:  pt has lower extremely edema, increased SOB, facility reported being unable to keep oxygen sat up. Ems states facility nurse reported pt up and walking and talking few days ago. On arrival to ed pt unable to respond verbally. Hx of hypertension, COPD, CHF, AFIB, pacemaker. Former smoker EXAM: CHEST  1 VIEW COMPARISON:  04/25/2019 and older exams. FINDINGS: Mild enlargement of the cardiac silhouette. No mediastinal or hilar masses. Right greater than left lung base opacities. Mid and upper lungs are clear. No pneumothorax. Skeletal structures are grossly intact. IMPRESSION: 1. Basilar opacities now partly obscure the hemidiaphragms, mildly improved from the previous exam, consistent with a combination of small effusions and atelectasis. 2. No new lung abnormalities.  No pulmonary edema. Electronically Signed   By: Lajean Manes M.D.   On: 05/11/2019 11:03    EKG:   Orders placed or performed during the hospital encounter of 05/11/19  .  EKG 12-Lead  . EKG 12-Lead  . EKG 12-Lead  . EKG 12-Lead  . ED EKG  . ED EKG    IMPRESSION AND PLAN:   86 year old male patient with history of chronic systolic heart failure with EF 25 to 30%, permanent atrial fibrillation, hypothyroidism recently was admitted and discharged after prolonged hospitalization to [place] brought back because of worsening lethargy.  #1 metabolic encephalopathy secondary to dehydration and not eating, continue IV fluids, ABG looked okay, no hypercarbia.  Will also check ammonia level. 2.  Acute kidney injury likely ATN due to not eating and drinking,  continue IV fluids, hold diuretics. 3.  History of permanent atrial fibrillation, continue to monitor on telemetry, yes Coreg and Eliquis. 4.  N.p.o., if lethargy improves patient can have oral medication and at that time start the patient on full liquid diet with aspiration precautions, speech therapy consult tomorrow. 5.  Prognosis poor secondary to advanced age, multiple medical problems, patient's daughter is requesting to talk to palliative care and also possibly hospice.  Wanted to see how he does next to 24 hours if no improvement , family want to consider hospice evaluation.  All the records are reviewed and case discussed with ED provider. Management plans discussed with the patient, family and they are in agreement.  CODE STATUS: DNR  TOTAL TIME TAKING CARE OF THIS PATIENT:6minutes.  Spoke with patient's daughter Epifanio Lesches M.D on 05/11/2019 at 4:27 PM  Between 7am to 6pm - Pager - [phone]  After 6pm go to [email or web address] - password [email or web address] [facility]  Office  [phone]  CC: Primary care physician; Marinda Elk, MD  Note: This dictation was prepared with Dragon dictation along with smaller phrase technology. Any transcriptional errors that result from this process are unintentional.

## 2019-05-12 ENCOUNTER — Inpatient Hospital Stay: Payer: Medicare HMO

## 2019-05-12 LAB — GLUCOSE, CAPILLARY
Glucose-Capillary: 66 mg/dL — ABNORMAL LOW (ref 70–99)
Glucose-Capillary: 52 mg/dL — ABNORMAL LOW (ref 70–99)
Glucose-Capillary: 44 mg/dL — CL (ref 70–99)
Glucose-Capillary: 85 mg/dL (ref 70–99)
Glucose-Capillary: 114 mg/dL — ABNORMAL HIGH (ref 70–99)
Glucose-Capillary: 111 mg/dL — ABNORMAL HIGH (ref 70–99)
Glucose-Capillary: 107 mg/dL — ABNORMAL HIGH (ref 70–99)
Glucose-Capillary: 103 mg/dL — ABNORMAL HIGH (ref 70–99)
Glucose-Capillary: 75 mg/dL (ref 70–99)
Glucose-Capillary: 73 mg/dL (ref 70–99)

## 2019-05-12 LAB — CBC
HCT: 45.1 % (ref 39.0–52.0)
Hemoglobin: 15.2 g/dL (ref 13.0–17.0)
MCH: 35.3 pg — ABNORMAL HIGH (ref 26.0–34.0)
MCHC: 33.7 g/dL (ref 30.0–36.0)
MCV: 104.9 fL — ABNORMAL HIGH (ref 80.0–100.0)
Platelets: 113 10*3/uL — ABNORMAL LOW (ref 150–400)
RBC: 4.3 MIL/uL (ref 4.22–5.81)
RDW: 14 % (ref 11.5–15.5)
WBC: 4.5 10*3/uL (ref 4.0–10.5)
nRBC: 2.2 % — ABNORMAL HIGH (ref 0.0–0.2)

## 2019-05-12 LAB — BASIC METABOLIC PANEL
Anion gap: 17 — ABNORMAL HIGH (ref 5–15)
BUN: 87 mg/dL — ABNORMAL HIGH (ref 8–23)
CO2: 27 mmol/L (ref 22–32)
Calcium: 9.1 mg/dL (ref 8.9–10.3)
Chloride: 94 mmol/L — ABNORMAL LOW (ref 98–111)
Creatinine, Ser: 1.98 mg/dL — ABNORMAL HIGH (ref 0.61–1.24)
GFR calc Af Amer: 34 mL/min — ABNORMAL LOW (ref >60)
GFR calc non Af Amer: 29 mL/min — ABNORMAL LOW (ref >60)
Glucose, Bld: 162 mg/dL — ABNORMAL HIGH (ref 70–99)
Potassium: 5.1 mmol/L (ref 3.5–5.1)
Sodium: 138 mmol/L (ref 135–145)

## 2019-05-12 LAB — MRSA PCR SCREENING: MRSA by PCR: NEGATIVE

## 2019-05-12 MED ORDER — SODIUM CHLORIDE 0.9 % IV SOLN
1.0000 g | Freq: Every day | INTRAVENOUS | Status: DC
  Administered 2019-05-12 – 2019-05-15 (×4): 1 g via INTRAVENOUS
  Filled 2019-05-12: qty 10
  Filled 2019-05-12 (×2): qty 1
  Filled 2019-05-12 (×2): qty 10

## 2019-05-12 MED ORDER — DEXTROSE 50 % IV SOLN
25.0000 g | INTRAVENOUS | Status: AC
  Administered 2019-05-12: 03:00:00 25 g via INTRAVENOUS

## 2019-05-12 MED ORDER — MORPHINE SULFATE (PF) 2 MG/ML IV SOLN: 2.0000 mg | Freq: Four times a day (QID) | INTRAVENOUS | Status: DC | PRN

## 2019-05-12 MED ORDER — ENOXAPARIN SODIUM 80 MG/0.8ML ~~LOC~~ SOLN
1.0000 mg/kg | SUBCUTANEOUS | Status: DC
  Administered 2019-05-13 – 2019-05-15 (×3): 70 mg via SUBCUTANEOUS
  Filled 2019-05-12 (×3): qty 0.8

## 2019-05-12 MED ORDER — DEXTROSE 50 % IV SOLN
INTRAVENOUS | Status: AC
  Administered 2019-05-12: 03:00:00 25 g via INTRAVENOUS
  Filled 2019-05-12: qty 50

## 2019-05-12 MED ORDER — APIXABAN 2.5 MG PO TABS: 2.5000 mg | ORAL_TABLET | Freq: Two times a day (BID) | ORAL | Status: DC

## 2019-05-12 MED ORDER — DEXTROSE 50 % IV SOLN
12.5000 g | INTRAVENOUS | Status: AC
  Administered 2019-05-12: 02:00:00 12.5 g via INTRAVENOUS

## 2019-05-12 MED ORDER — IPRATROPIUM-ALBUTEROL 0.5-2.5 (3) MG/3ML IN SOLN
3.0000 mL | RESPIRATORY_TRACT | Status: DC
  Administered 2019-05-12 – 2019-05-13 (×5): 3 mL via RESPIRATORY_TRACT
  Filled 2019-05-12 (×5): qty 3

## 2019-05-12 MED ORDER — ENOXAPARIN SODIUM 80 MG/0.8ML ~~LOC~~ SOLN: 1.0000 mg/kg | Freq: Two times a day (BID) | SUBCUTANEOUS | Status: DC

## 2019-05-12 MED ORDER — DEXTROSE 50 % IV SOLN
INTRAVENOUS | Status: AC
  Filled 2019-05-12: qty 50

## 2019-05-12 MED ORDER — DEXTROSE 5 % IV SOLN: INTRAVENOUS | Status: AC

## 2019-05-12 NOTE — Progress Notes (Signed)
Unable to apply condom catheter due to perineal edema.  Family refusing straight catheter for specimen collection.  Plan of care and pt's status discussed with family.  Awaiting Palliative Care meeting to determine discharge plan (palliative vs. Hospice vs. Comfort care).  Requested hospitalist to come speak with family due to delay in palliative care consult.

## 2019-05-12 NOTE — Progress Notes (Addendum)
Hypoglycemic Event  CBG: 85  Treatment: D50 % solution 25 g  Symptoms: Asymptomatic  Follow-up CBG: 0330 Time: CBG Result: 85 Possible Reasons for Event: NPO  Comments/MD notified:Ouma NP and ordered to place pt on D5 % solution 50 ml/hr continuously.  Update 0330: Ouma states to recheck blood sugar and if sugar <70 to start D5% solution 50 ml/hr continuously. Will recheck sugar at 0430. Will continue to monitor.  Update 0430: Pt blood sugar at 114. Notify Ouma NP. Will continue to monitor.  Update 0630: Pt blood sugar was at 111. Notified Ouma NP and incoming shift. D5%  Solution was not administered due to pt above 70. Will continue to monitor.  Alexismarie Flaim Elmon Kirschner

## 2019-05-12 NOTE — Progress Notes (Signed)
PHARMACY NOTE:  RENAL DOSAGE ADJUSTMENT  Current antimicrobial regimen includes a mismatch between apixaban dosage and estimated renal function.    Current apixaban dosage:  5 mg BID  Indication: atrial fibrillation  Renal Function:  Estimated Creatinine Clearance: 22.8 mL/min (A) (by C-G formula based on SCr of 1.98 mg/dL (H)). []      On intermittent HD, scheduled: []      On CRRT    dosage has been changed to:  apixaban 2.5 mg BID   Thank you for allowing pharmacy to be a part of this patient's care.  Dallie Piles, [facility] 05/12/2019 8:06 AM

## 2019-05-12 NOTE — Progress Notes (Signed)
PHARMACY NOTE:  RENAL DOSAGE ADJUSTMENT  Enoxaparin dose adjusted due to renal function    Current apixaban dosage:  Enoxaparin 70 mg Q12 hours  Indication: atrial fibrillation  Estimated Creatinine Clearance: 22.8 mL/min (A) (by C-G formula based on SCr of 1.98 mg/dL (H)). []      On intermittent HD, scheduled: []      On CRRT    Dosage has been changed to:  Enoxaparin 70 mg Q24 hours per patient's renal function.   Thank you for allowing pharmacy to be a part of this patient's care.  Gerald Dexter, [facility] 05/12/2019 10:13 AM

## 2019-05-12 NOTE — Progress Notes (Addendum)
Hypoglycemic Event  CBG: 66  Treatment: D50 % solution 12.5 g  Symptoms: asymptomatic  Follow-up CBG: Time 0121 CBG Result: 44  Possible Reasons for Event: NPO  Comments/MD notified: 0136    Gabriel Delacruz M [facility] 

## 2019-05-12 NOTE — Progress Notes (Signed)
[facility] at [place] NAME: Gabriel Delacruz    MR#:  [id number]  DATE OF BIRTH:  November 07, 1927  SUBJECTIVE: Admitted because of lethargy and found to have acute kidney injury.  ABG did not show CO2 elevation, very lethargic, saw the patient again this morning, continued to be lethargic, opens eyes briefly to sternal rub but falls back to sleep.  Patient n.p.o. secondary to lethargy.  On IV fluids for acute kidney injury, noted to have wheezing, also has swelling of both legs.  CHIEF COMPLAINT:   Chief Complaint  Patient presents with  . Shortness of Breath    REVIEW OF SYSTEMS:   ROS   Unable to obtain review of systems because of lethargy. No Known Allergies  VITALS:  Blood pressure 107/67, pulse 74, temperature 97.6 F (36.4 C), temperature source Oral, resp. rate 20, height 5\' 6"  (1.676 m), weight 71 kg, SpO2 90 %.  PHYSICAL EXAMINATION:  GENERAL:  85 y.o.-year-old patient lying in the bed with no acute distress.  EYES: Pupils equal, round, reactive to light  No scleral icterus. Extraocular muscles intact.  HEENT: Head atraumatic, normocephalic. Oropharynx and nasopharynx clear.  NECK:  Supple, no jugular venous distention. No thyroid enlargement, no tenderness.  LUNGS: N diminished breath sounds bilaterally.  CARDIOVASCULAR: S1, S2 normal. No murmurs, rubs, or gallops.  ABDOMEN: Soft, nontender, nondistended. Bowel sounds present. No organomegaly or mass.  EXTREMITIES: No pedal edema, cyanosis, or clubbing.  NEUROLOGIC: Cranial nerves II through XII are intact. Muscle strength 5/5 in all extremities. Sensation intact. Gait not checked.  PSYCHIATRIC: The patient is alert and oriented x 3.  SKIN: No obvious rash, lesion, or ulcer.    LABORATORY PANEL:   CBC Recent Labs  Lab 05/12/19 0447  WBC 4.5  HGB 15.2  HCT 45.1  PLT 113*    ------------------------------------------------------------------------------------------------------------------  Chemistries  Recent Labs  Lab 05/12/19 0447  NA 138  K 5.1  CL 94*  CO2 27  GLUCOSE 162*  BUN 87*  CREATININE 1.98*  CALCIUM 9.1   ------------------------------------------------------------------------------------------------------------------  Cardiac Enzymes No results for input(s): TROPONINI in the last 168 hours. ------------------------------------------------------------------------------------------------------------------  RADIOLOGY:  Dg Chest 1 View  Result Date: 05/11/2019 CLINICAL DATA:  pt has lower extremely edema, increased SOB, facility reported being unable to keep oxygen sat up. Ems states facility nurse reported pt up and walking and talking few days ago. On arrival to ed pt unable to respond verbally. Hx of hypertension, COPD, CHF, AFIB, pacemaker. Former smoker EXAM: CHEST  1 VIEW COMPARISON:  04/25/2019 and older exams. FINDINGS: Mild enlargement of the cardiac silhouette. No mediastinal or hilar masses. Right greater than left lung base opacities. Mid and upper lungs are clear. No pneumothorax. Skeletal structures are grossly intact. IMPRESSION: 1. Basilar opacities now partly obscure the hemidiaphragms, mildly improved from the previous exam, consistent with a combination of small effusions and atelectasis. 2. No new lung abnormalities.  No pulmonary edema. Electronically Signed   By: Lajean Manes M.D.   On: 05/11/2019 11:03    EKG:   Orders placed or performed during the hospital encounter of 05/11/19  . EKG 12-Lead  . EKG 12-Lead  . EKG 12-Lead  . EKG 12-Lead  . ED EKG  . ED EKG    ASSESSMENT AND PLAN:   85 year old male with history of chronic systolic heart failure with EF 25 to 30%, chronic atrial fibrillation on Eliquis, recently discharged from hospital to [place] comes back  yesterday because of lethargy. 1.  Lethargy  with metabolic encephalopathy secondary to acute kidney injury, not eating well, started on gentle hydration, renal function is improving but patient is still very lethargic, will do CT of the head to see if he had any stroke. 2.  Chronic atrial fibrillation, patient is on telemetry, continue Lovenox, discontinue Eliquis as patient is n.p.o. cannot give Eliquis. 3.  Metabolic encephalopathy ammonia level is normal ABG is normal, patient to get UA urinalysis is ordered but not done yet.  Will do CT of the head. 4.  Chronic systolic heart failure , acute kidney injury, unable to give IV Lasix secondary to renal failure, patient is swollen, hold off on the fluids 5.  Reactive airway disease: Continue wheezing, use DuoNeb's.  Spoke with patient's daughter yesterday and they are leaning towards palliative care, hospice placement if patient does not improve over the weekend. All the records are reviewed and case discussed with Care Management/Social Workerr. Management plans discussed with the patient, family and they are in agreement.  CODE STATUS: DNR  TOTAL TIME TAKING CARE OF THIS PATIENT:38 minutes.  More than 50% of time spent in counseling, coordination of care. POSSIBLE D/C IN 1-2DAYS, DEPENDING ON CLINICAL CONDITION.   Epifanio Lesches M.D on 05/12/2019 at 11:14 AM  Between 7am to 6pm - Pager - [phone]  After 6pm go to [email or web address] - password [email or web address] [facility]  Office  [phone]  CC: Primary care physician; Marinda Elk, MD   Note: This dictation was prepared with Dragon dictation along with smaller phrase technology. Any transcriptional errors that result from this process are unintentional.

## 2019-05-13 ENCOUNTER — Other Ambulatory Visit: Payer: Self-pay

## 2019-05-13 LAB — GLUCOSE, CAPILLARY
Glucose-Capillary: 99 mg/dL (ref 70–99)
Glucose-Capillary: 85 mg/dL (ref 70–99)
Glucose-Capillary: 52 mg/dL — ABNORMAL LOW (ref 70–99)
Glucose-Capillary: 54 mg/dL — ABNORMAL LOW (ref 70–99)
Glucose-Capillary: 158 mg/dL — ABNORMAL HIGH (ref 70–99)

## 2019-05-13 LAB — BASIC METABOLIC PANEL
Anion gap: 13 (ref 5–15)
BUN: 86 mg/dL — ABNORMAL HIGH (ref 8–23)
CO2: 26 mmol/L (ref 22–32)
Calcium: 8.7 mg/dL — ABNORMAL LOW (ref 8.9–10.3)
Chloride: 99 mmol/L (ref 98–111)
Creatinine, Ser: 1.65 mg/dL — ABNORMAL HIGH (ref 0.61–1.24)
GFR calc Af Amer: 42 mL/min — ABNORMAL LOW (ref >60)
GFR calc non Af Amer: 36 mL/min — ABNORMAL LOW (ref >60)
Glucose, Bld: 103 mg/dL — ABNORMAL HIGH (ref 70–99)
Potassium: 4.8 mmol/L (ref 3.5–5.1)
Sodium: 138 mmol/L (ref 135–145)

## 2019-05-13 MED ORDER — DEXTROSE-NACL 5-0.9 % IV SOLN: INTRAVENOUS | Status: DC

## 2019-05-13 MED ORDER — DEXTROSE-NACL 5-0.9 % IV SOLN
INTRAVENOUS | Status: AC
  Administered 2019-05-13: 23:00:00 via INTRAVENOUS

## 2019-05-13 MED ORDER — DEXTROSE 50 % IV SOLN
25.0000 g | INTRAVENOUS | Status: AC
  Administered 2019-05-13: 25 g via INTRAVENOUS
  Filled 2019-05-13: qty 50

## 2019-05-13 MED ORDER — DEXTROSE 50 % IV SOLN
25.0000 g | INTRAVENOUS | Status: AC
  Administered 2019-05-13: 22:00:00 25 g via INTRAVENOUS
  Filled 2019-05-13: qty 50

## 2019-05-13 MED ORDER — IPRATROPIUM-ALBUTEROL 0.5-2.5 (3) MG/3ML IN SOLN
3.0000 mL | Freq: Three times a day (TID) | RESPIRATORY_TRACT | Status: DC
  Administered 2019-05-13 – 2019-05-15 (×5): 3 mL via RESPIRATORY_TRACT
  Filled 2019-05-13 (×6): qty 3

## 2019-05-13 NOTE — Progress Notes (Signed)
   05/13/19 1840  Clinical Encounter Type  Visited With Patient and family together  Visit Type Initial  Referral From Chaplain  Consult/Referral To Chaplain  Spiritual Encounters  Spiritual Needs Prayer;Emotional;Grief support  CH met patient's daughter in hallway as patient was receiving care from medical staff. Daughter shared that she requested [place] more for her than her father. Daughter is trying to process father's sudden decline. PT was independent 8 weeks prior and now she is discussing with her brothers plans of hospice care. [place] provided pastoral care by normalizing feelings and emotions. Daughter and patient are both religious. When medical staff was finished, this [place] entered room and prayed with patient. Pastoral visit was appreciated.

## 2019-05-13 NOTE — Plan of Care (Signed)
  Problem: Pain Managment: Goal: General experience of comfort will improve Outcome: Progressing   Problem: Safety: Goal: Ability to remain free from injury will improve Outcome: Progressing   Problem: Skin Integrity: Goal: Risk for impaired skin integrity will decrease Outcome: Progressing   Problem: Activity: Goal: Risk for activity intolerance will decrease Outcome: Not Progressing    Problem: Nutrition: Goal: Adequate nutrition will be maintained Outcome: Not Progressing: Pt remains NPO due to lethargy and inability to swallow safely. Will continue to monitor.

## 2019-05-13 NOTE — Plan of Care (Signed)
Pt remains minimally responsive to voice.     Problem: Education: Goal: Knowledge of General Education information will improve Description: Including pain rating scale, medication(s)/side effects and non-pharmacologic comfort measures Outcome: Not Progressing   Problem: Health Behavior/Discharge Planning: Goal: Ability to manage health-related needs will improve Outcome: Not Progressing   Problem: Clinical Measurements: Goal: Ability to maintain clinical measurements within normal limits will improve Outcome: Not Progressing Goal: Will remain free from infection Outcome: Not Progressing Goal: Diagnostic test results will improve Outcome: Not Progressing Goal: Respiratory complications will improve Outcome: Not Progressing Goal: Cardiovascular complication will be avoided Outcome: Not Progressing   Problem: Activity: Goal: Risk for activity intolerance will decrease Outcome: Not Progressing   Problem: Nutrition: Goal: Adequate nutrition will be maintained Outcome: Not Progressing   Problem: Coping: Goal: Level of anxiety will decrease Outcome: Not Progressing   Problem: Elimination: Goal: Will not experience complications related to bowel motility Outcome: Not Progressing Goal: Will not experience complications related to urinary retention Outcome: Not Progressing   Problem: Pain Managment: Goal: General experience of comfort will improve Outcome: Not Progressing   Problem: Safety: Goal: Ability to remain free from injury will improve Outcome: Not Progressing   Problem: Skin Integrity: Goal: Risk for impaired skin integrity will decrease Outcome: Not Progressing

## 2019-05-13 NOTE — Progress Notes (Addendum)
Hypoglycemic Event  CBG:54  Treatment: D50% solution 25 g  Symptoms: Asymptomatic  Follow-up CBG: Time:  2130           CBG Result: 52 Possible Reasons for Event: NPO   Comments/MD notified: Dr. Marthann Schiller notified at 2058  Update 2130: Dr. Molli Hazard was notified blood sugar 52 after 15 mins of administering D50% solution 25g. Will initiated second hyploglycemia protocol.  Update 2235: pt blood sugar at 158. Notify prime.Will continue to monitor.  Sophina Mitten Elmon Kirschner

## 2019-05-13 NOTE — Progress Notes (Signed)
[facility] NP ordered to run D 5%- 09%b sodium chloride at a rate of 75 ml/hr to run for 4 hours and re-check blood sugar after completion of fluids. Will continue to monitor.

## 2019-05-13 NOTE — Progress Notes (Signed)
Due to repeated hypoglycemia , I have started on dextrose 5% normal saline IV.

## 2019-05-13 NOTE — Progress Notes (Signed)
[facility] at [place] NAME: Gabriel Delacruz    MR#:  [id number]  DATE OF BIRTH:  11/15/1927  Spoke with patient's daughter over the phone yesterday, they are leaning towards hospice at home.  Spoke with case Radio broadcast assistant.  Patient still very lethargic, poorly responsive.  CHIEF COMPLAINT:   Chief Complaint  Patient presents with  . Shortness of Breath    REVIEW OF SYSTEMS:   ROS   Unable to obtain review of systems because of lethargy. No Known Allergies  VITALS:  Blood pressure 110/73, pulse 69, temperature (!) 97.5 F (36.4 C), temperature source Oral, resp. rate 20, height 5\' 6"  (1.676 m), weight 73.4 kg, SpO2 97 %.  PHYSICAL EXAMINATION:  GENERAL:  85 y.o.-year-old patient lying in the bed with no acute distress.  EYES: Pupils equal, round, reactive to light  No scleral icterus. Extraocular muscles intact.  HEENT: Head atraumatic, normocephalic. Oropharynx and nasopharynx clear.  NECK:  Supple, no jugular venous distention. No thyroid enlargement, no tenderness.  LUNGS: N diminished breath sounds bilaterally.  CARDIOVASCULAR: S1, S2 normal. No murmurs, rubs, or gallops.  ABDOMEN: Soft, nontender, nondistended. Bowel sounds present. No organomegaly or mass.  EXTREMITIES: No pedal edema, cyanosis, or clubbing.  NEUROLOGIC: Cranial nerves II through XII are intact. Muscle strength 5/5 in all extremities. Sensation intact. Gait not checked.  PSYCHIATRIC: The patient is alert and oriented x 3.  SKIN: No obvious rash, lesion, or ulcer.    LABORATORY PANEL:   CBC Recent Labs  Lab 05/12/19 0447  WBC 4.5  HGB 15.2  HCT 45.1  PLT 113*   ------------------------------------------------------------------------------------------------------------------  Chemistries  Recent Labs  Lab 05/13/19 0521  NA 138  K 4.8  CL 99  CO2 26  GLUCOSE 103*  BUN 86*  CREATININE 1.65*  CALCIUM 8.7*    ------------------------------------------------------------------------------------------------------------------  Cardiac Enzymes No results for input(s): TROPONINI in the last 168 hours. ------------------------------------------------------------------------------------------------------------------  RADIOLOGY:  Dg Chest 1 View  Result Date: 05/11/2019 CLINICAL DATA:  pt has lower extremely edema, increased SOB, facility reported being unable to keep oxygen sat up. Ems states facility nurse reported pt up and walking and talking few days ago. On arrival to ed pt unable to respond verbally. Hx of hypertension, COPD, CHF, AFIB, pacemaker. Former smoker EXAM: CHEST  1 VIEW COMPARISON:  04/25/2019 and older exams. FINDINGS: Mild enlargement of the cardiac silhouette. No mediastinal or hilar masses. Right greater than left lung base opacities. Mid and upper lungs are clear. No pneumothorax. Skeletal structures are grossly intact. IMPRESSION: 1. Basilar opacities now partly obscure the hemidiaphragms, mildly improved from the previous exam, consistent with a combination of small effusions and atelectasis. 2. No new lung abnormalities.  No pulmonary edema. Electronically Signed   By: Lajean Manes M.D.   On: 05/11/2019 11:03   Ct Head Wo Contrast  Result Date: 05/12/2019 CLINICAL DATA:  Fatigue and malaise.  Lethargy. EXAM: CT HEAD WITHOUT CONTRAST TECHNIQUE: Contiguous axial images were obtained from the base of the skull through the vertex without intravenous contrast. COMPARISON:  CT head 04/20/2019 FINDINGS: Brain: Moderate atrophy, stable. Asymmetric white matter hypodensity in the left frontal lobe is stable. Probable chronic ischemia. Additional areas of chronic ischemia in the white matter bilaterally Negative for acute cortical infarct, hemorrhage, mass. Vascular: Atherosclerotic calcification cavernous carotid and middle cerebral arteries bilaterally. Negative for hyperdense vessel. Skull:  Negative Sinuses/Orbits: Negative Other: None IMPRESSION: No acute abnormality and no change since 04/20/2019 Moderate  atrophy with chronic ischemic changes. White matter ischemia asymmetric in the left frontal lobe is unchanged from the prior study. Hello okay Electronically Signed   By: Franchot Gallo M.D.   On: 05/12/2019 13:44    EKG:   Orders placed or performed during the hospital encounter of 05/11/19  . EKG 12-Lead  . EKG 12-Lead  . EKG 12-Lead  . EKG 12-Lead  . ED EKG  . ED EKG    ASSESSMENT AND PLAN:   85 year old male with history of chronic systolic heart failure with EF 25 to 30%, chronic atrial fibrillation on Eliquis, recently discharged from hospital to [place] comes back yesterday because of lethargy. 1.  Lethargy with metabolic encephalopathy secondary to acute kidney injury, not eating well, started on gentle hydration, renal function is improving but patient is still very lethargic, will do CT of the head to see if he had any stroke. 2.  Chronic atrial fibrillation, patient is on telemetry, continue Lovenox, discontinue Eliquis as patient is n.p.o. cannot give Eliquis. 3.  Metabolic encephalopathy ammonia level is normal ABG is normal, patient to get UA urinalysis is ordered but not done yet.  Will do CT of the head. 4.  Chronic systolic heart failure , acute kidney injury, unable to give IV Lasix secondary to renal failure, patient is swollen, hold off on the fluids 5.  Reactive airway disease: Continue wheezing, use DuoNeb's. 85 year old male with multiple medical problems of permanent atrial fibrillation, chronic systolic heart failure, chronic gout, deconditioning, now with renal failure and poorly responsive, family is leaning towards hospice at home.  Spoke with case Radio broadcast assistant.  Patient went to [facility] last time with palliative care to follow but family is now leaning forward with hospice at home.  Palliative care consult, discharging home with  hospice hopefully today or tomorrow.  For now patient can be moved to 1C. All the records are reviewed and case discussed with Care Management/Social Workerr. Management plans discussed with the patient, family and they are in agreement.  CODE STATUS: DNR  TOTAL TIME TAKING CARE OF THIS PATIENT:38 minutes.  More than 50% of time spent in counseling, coordination of care. POSSIBLE D/C IN 1-2DAYS, DEPENDING ON CLINICAL CONDITION.   Epifanio Lesches M.D on 05/13/2019 at 10:24 AM  Between 7am to 6pm - Pager - [phone]  After 6pm go to [email or web address] - password [email or web address] [facility]  Office  [phone]  CC: Primary care physician; Marinda Elk, MD   Note: This dictation was prepared with Dragon dictation along with smaller phrase technology. Any transcriptional errors that result from this process are unintentional.

## 2019-05-14 DIAGNOSIS — I5023 Acute on chronic systolic (congestive) heart failure: Secondary | ICD-10-CM

## 2019-05-14 DIAGNOSIS — Z515 Encounter for palliative care: Secondary | ICD-10-CM

## 2019-05-14 DIAGNOSIS — Z7189 Other specified counseling: Secondary | ICD-10-CM

## 2019-05-14 LAB — GLUCOSE, CAPILLARY
Glucose-Capillary: 110 mg/dL — ABNORMAL HIGH (ref 70–99)
Glucose-Capillary: 128 mg/dL — ABNORMAL HIGH (ref 70–99)
Glucose-Capillary: 38 mg/dL — CL (ref 70–99)
Glucose-Capillary: 71 mg/dL (ref 70–99)
Glucose-Capillary: 107 mg/dL — ABNORMAL HIGH (ref 70–99)

## 2019-05-14 MED ORDER — SODIUM CHLORIDE 0.9 % IV SOLN
INTRAVENOUS | Status: DC | PRN
  Administered 2019-05-14: 250 mL via INTRAVENOUS

## 2019-05-14 MED ORDER — DEXTROSE 50 % IV SOLN
50.0000 mL | Freq: Once | INTRAVENOUS | Status: AC
  Administered 2019-05-14: 50 mL via INTRAVENOUS
  Filled 2019-05-14: qty 50

## 2019-05-14 MED ORDER — DEXTROSE 10 % IV SOLN
INTRAVENOUS | Status: DC
  Administered 2019-05-14 – 2019-05-15 (×2): via INTRAVENOUS

## 2019-05-14 MED ORDER — DEXTROSE 10 % IV SOLN
INTRAVENOUS | Status: AC
  Administered 2019-05-14: 10:00:00 via INTRAVENOUS

## 2019-05-14 MED ORDER — DEXTROSE 10 % IV SOLN: INTRAVENOUS | Status: DC

## 2019-05-14 NOTE — Clinical Social Work Note (Signed)
Patient's family have spoken to physician and palliative, they have decided they would like to have patient go home with hospice.  CSW provided choice of hospice agencies, and they would like [facility] for home hospice services.  CSW contacted Santiago Glad at [facility] and informed her of the referral.  CSW asked what equipment they may need, and they requested oxygen, and hospital bed so far.  Patient's daughter is at bedside, and not sure what other equipment is needed.  Formal assessment to follow, patient was at [facility] for short term rehab, but then was readmitted to the hospital.  Jones Broom. [place], MSW, [place]  05/14/2019 12:26 PM

## 2019-05-14 NOTE — Progress Notes (Signed)
New referral for [facility] hospice services at home received from [place].  Writer met with patient's daughter [name] to initiate education regarding hospice services, philosophy and team approach to care. Questions answered. DME needs discussed. Patient will need a hospital bed and oxygen, DME to be delivered later today with planned discharge home tomorrow 9/29. Patient will require EMS transport with signed out of facility DNR in place. Patient information faxed to referral. Hospital care team updated. Will follow through discharge. Flo Shanks BSN, RN, [facility] [facility] [phone]

## 2019-05-14 NOTE — Progress Notes (Addendum)
Pt Blood sugar at 128 after the fluid D5%-0.9% normal saline75 ml/hr of fluids to run for 4 hours for pt blood sugar 52. CCMD called and reported that pt have a 11 run of non sustained V-tach. Pt oxygen saturation at 80% on 2 liters [place] with a clear diminished lung sounds and need to bump up to 3 liters and pt sating now at 90 % and give one time breathing treatment. Notify Prime and talked to [facility] NP and states to continue monitor pt at this time. Will continue to monitor.

## 2019-05-14 NOTE — Progress Notes (Signed)
[facility] at [place] NAME: Gabriel Delacruz    MR#:  [id number]  DATE OF BIRTH:  1928-06-11  minimally responsive, spoke with patient's nurse, palliative care recommends to continue IV fluids until patient son's from out of state to come visit him and plan to send him home with hospice when arrangements are made.  CHIEF COMPLAINT:   Chief Complaint  Patient presents with  . Shortness of Breath   Minimally responsive, has hypoglycemic events, started on D5 IV fluids last night but this morning again has hypoglycemic with blood sugar less than 38, started on D10 infusion, patient has no history of diabetes. REVIEW OF SYSTEMS:   ROS   Unable to obtain review of systems because of lethargy. No Known Allergies  VITALS:  Blood pressure 113/70, pulse 70, temperature 97.6 F (36.4 C), temperature source Oral, resp. rate 18, height 5\' 6"  (1.676 m), weight 70.2 kg, SpO2 100 %.  PHYSICAL EXAMINATION:  GENERAL:  85 y.o.-year-old patient lying in the bed with no acute distress.  Unresponsive EYES: Pupils equal, round, reactive to light  No scleral icterus.HEENT: Head atraumatic, normocephalic. Oropharynx and nasopharynx clear.  NECK:  Supple, no jugular venous distention. No thyroid enlargement, no tenderness.  LUNGS: N diminished breath sounds bilaterally.  CARDIOVASCULAR: S1, S2 normal. No murmurs, rubs, or gallops.  ABDOMEN: Soft, nontender, nondistended. Bowel sounds present. No organomegaly or mass.  EXTREMITIES: No pedal edema, cyanosis, or clubbing.  NEUROLOGIC: patient is minimally responsive, not following commands to do full neuro exam.    PSYCHIATRIC: MINIMALLY RESPONSIVE.  SKIN: No obvious rash, lesion, or ulcer.    LABORATORY PANEL:   CBC Recent Labs  Lab 05/12/19 0447  WBC 4.5  HGB 15.2  HCT 45.1  PLT 113*    ------------------------------------------------------------------------------------------------------------------  Chemistries  Recent Labs  Lab 05/13/19 0521  NA 138  K 4.8  CL 99  CO2 26  GLUCOSE 103*  BUN 86*  CREATININE 1.65*  CALCIUM 8.7*   ------------------------------------------------------------------------------------------------------------------  Cardiac Enzymes No results for input(s): TROPONINI in the last 168 hours. ------------------------------------------------------------------------------------------------------------------  RADIOLOGY:  No results found.  EKG:   Orders placed or performed during the hospital encounter of 05/11/19  . EKG 12-Lead  . EKG 12-Lead  . EKG 12-Lead  . EKG 12-Lead  . ED EKG  . ED EKG    ASSESSMENT AND PLAN:   85 year old male with history of chronic systolic heart failure with EF 25 to 30%, chronic atrial fibrillation on Eliquis, recently discharged from hospital to [place] comes back yesterday because of lethargy. 1.  Lethargy with metabolic encephalopathy secondary to acute kidney injury, not eating well, started on gentle hydration, renal function is improving but patient is still very lethargic, will do CT of the head to see if he had any stroke. 2.  Chronic atrial fibrillation, patient is on telemetry, continue Lovenox, discontinue Eliquis as patient is n.p.o. cannot give Eliquis. 3.  Metabolic encephalopathy ammonia level is normal ABG is normal, patient to get UA urinalysis is ordered but not done yet.  Family refused for obtaining urinalysis  By  in and out catheter.   CT head unremarkable.   4.  Chronic systolic heart failure , acute kidney injury, unable to give IV Lasix secondary to renal failure, Recurrent hypoglycemia continue D10 infusion until family arrives, patient is now DNR, family is focused on making him comfortable in taking him home with hospice.  Appreciate palliative care following the  patient.  5.  Reactive airway disease: Continued wheezing, use DuoNeb's. 85 year old male with multiple medical problems of permanent atrial fibrillation, chronic systolic heart failure, chronic gout, deconditioning, now with renal failure and poorly responsive, will discharge to hospice at home when arrangements are made continue, continue D10 infusion for now. All the records are reviewed and case discussed with Care Management/Social Workerr. Management plans discussed with the patient, family and they are in agreement.  CODE STATUS: DNR  TOTAL TIME TAKING CARE OF THIS PATIENT:38 minutes.  More than 50% of time spent in counseling, coordination of care. POSSIBLE D/C IN 1-2DAYS, DEPENDING ON CLINICAL CONDITION.   Epifanio Lesches M.D on 05/14/2019 at 1:08 PM  Between 7am to 6pm - Pager - [phone]  After 6pm go to [email or web address] - password [email or web address] [place] [facility]  Office  [phone]  CC: Primary care physician; Marinda Elk, MD   Note: This dictation was prepared with Dragon dictation along with smaller phrase technology. Any transcriptional errors that result from this process are unintentional.

## 2019-05-14 NOTE — Plan of Care (Signed)
  Problem: Clinical Measurements: Goal: Respiratory complications will improve Outcome: Progressing   Problem: Safety: Goal: Ability to remain free from injury will improve Outcome: Progressing   

## 2019-05-14 NOTE — Care Management Important Message (Signed)
Important Message  Patient Details  Name: Gabriel Delacruz MRN: [id number] Date of Birth: [birth year]   Medicare Important Message Given:  Yes     [name] 05/14/2019, 12:12 PM

## 2019-05-14 NOTE — Progress Notes (Addendum)
Patient blood sugar 38 this morning, upon assessment patient asleep. Administered 50 ml of dextrose 50% solution iv , after administering patient was more arousable. Opened eyes and was able to answer name and birthday. Palliative at bedside.   Update 1233: Spoke with palliative, per palliative continue the fluids for now until patient leaves the hospital. Verbal orders from Dr. Vianne Bulls to change order for continous fluids.   1655: Asked MD about more often sugar checks since he is only a morning sugar check and his sugar was 38 this morning. Verbal orders to check sugars TID. Orders put in. Will continue to monitor patient.

## 2019-05-14 NOTE — Consult Note (Addendum)
Consultation Note Date: 05/14/2019   Patient Name: Gabriel Delacruz  DOB: 04/12/1928  MRN: [id number]  Age / Sex: 85 y.o., male  PCP: Marinda Elk, MD Referring Physician: Epifanio Lesches, MD  Reason for Consultation: Establishing goals of care  HPI/Patient Profile: Gabriel Delacruz  is a 85 y.o. male with a known history of chronic systolic heart failure, permanent atrial fibrillation, hypothyroidism brought from [facility] because of worsening lethargy for the last 3 days.  Patient noted to have acute kidney injury on blood work.  Spoke to patient's daughter, according to her patient was doing okay last 1 week but noted to have lethargy, patient not eating or drinking and not communicating much so brought him here.  Clinical Assessment and Goals of Care: Patient is resting in bed. He states "no" when asked if he has pain. He says "no" when asked about hunger or thirst. He says "yes" when asked if he is ready to go home to be with his family.   Spoke with Gabriel Delacruz, and Gabriel Delacruz via phone. Gabriel Delacruz is caring for their mother in her home currently. We discussed his diagnoses, prognosis, GOC, EOL wishes disposition and options. They understand his edema, electrolytes and Lasix needs from last admission. They understand he is not eating and drinking well, is currently NPO due to lethargy, is currently not hungry or thirsty, and he has been having hypoglycemia.  A detailed discussion was had today regarding advanced directives.  The difference between an aggressive medical intervention path and a comfort care path was discussed.  Values and goals of care important to patient and family were attempted to be elicited.  Discussed limitations of medical interventions to prolong quality of life in some situations and discussed the concept of human mortality. Natural trajectory and expectations at  EOL were discussed.   The family states they understand his status and would like to bring him home with hospice. [facility] would be [facility] to [name], and the brothers would be flying in from out of town to help with his and their mother's care.  ADDENDUM: Daughter Gabriel Delacruz came to bedside. SW at bedside discussing plans for home with hospice. Patient with focus on comfort, but continue D10% drip until D/C as 2 sons are coming from out of state to see him.   I completed a MOST form today and the signed original was placed in the chart. A photocopy was placed in the chart to be scanned into EMR. The patient's family outlined their wishes for the following treatment decisions:  Cardiopulmonary Resuscitation: Do Not Attempt Resuscitation (DNR/No CPR)  Medical Interventions: Comfort Measures: Keep clean, warm, and dry. Use medication by any route, positioning, wound care, and other measures to relieve pain and suffering. Use oxygen, suction and manual treatment of airway obstruction as needed for comfort. Do not transfer to the hospital unless comfort needs cannot be met in current location.  Antibiotics: No antibiotics (use other measures to relieve symptoms)  IV Fluids: No IV fluids (provide other measures  to ensure comfort)  Feeding Tube: No feeding tube         [place] with hospice. Patient with focus on comfort, but continue D10% drip until D/C as 2 sons are coming from out of state to see him.   Prognosis:   < 2 weeks Hypoglycemia, poor PO intake  Discharge Planning: Home with Hospice      Primary Diagnoses: Present on Admission: . Acute kidney injury ([place])   I have reviewed the medical record, interviewed the patient and family, and examined the patient. The following aspects are pertinent.  Past Medical History:  Diagnosis Date  . Anemia   . Atrial fibrillation ([place])   . CHF (congestive heart failure) ([place])   . COPD (chronic obstructive pulmonary  disease) ([place])   . GERD (gastroesophageal reflux disease)   . Glaucoma   . Hyperlipemia   . Hypertension   . Hypothyroidism   . Irregular heart rhythm   . Left anterior fascicular block   . MGUS (monoclonal gammopathy of unknown significance)   . RBBB    Social History   Socioeconomic History  . Marital status: Divorced    Spouse name: Not on file  . Number of children: Not on file  . Years of education: Not on file  . Highest education level: Not on file  Occupational History  . Not on file  Social Needs  . Financial resource strain: Not on file  . Food insecurity    Worry: Not on file    Inability: Not on file  . Transportation needs    Medical: Not on file    Non-medical: Not on file  Tobacco Use  . Smoking status: Former Research scientist (life sciences)  . Smokeless tobacco: Never Used  Substance and Sexual Activity  . Alcohol use: Not Currently  . Drug use: Not Currently  . Sexual activity: Not on file  Lifestyle  . Physical activity    Days per week: Not on file    Minutes per session: Not on file  . Stress: Not on file  Relationships  . Social Herbalist on phone: Not on file    Gets together: Not on file    Attends religious service: Not on file    Active member of club or organization: Not on file    Attends meetings of clubs or organizations: Not on file    Relationship status: Not on file  Other Topics Concern  . Not on file  Social History Narrative  . Not on file   Family History  Problem Relation Age of Onset  . Hypertension Mother   . Hypertension Father    Scheduled Meds: . dorzolamide-timolol  1 drop Both Eyes BID  . enoxaparin (LOVENOX) injection  1 mg/kg Subcutaneous Q24H  . ipratropium-albuterol  3 mL Nebulization TID  . latanoprost  1 drop Both Eyes QHS  . mouth rinse  15 mL Mouth Rinse BID   Continuous Infusions: . sodium chloride 250 mL (05/14/19 0932)  . cefTRIAXone (ROCEPHIN)  IV 1 g (05/14/19 0933)  . dextrose 50 mL/hr at 05/14/19 0940    PRN Meds:.sodium chloride, acetaminophen **OR** acetaminophen, albuterol, bisacodyl, morphine injection, ondansetron **OR** ondansetron (ZOFRAN) IV Medications Prior to Admission:  Prior to Admission medications   Medication Sig Start Date End Date Taking? Authorizing Provider  apixaban (ELIQUIS) 5 MG TABS tablet Take 1 tablet (5 mg total) by mouth 2 (two) times daily. 05/04/19  Yes Mayo, Pete Pelt, MD  atorvastatin (LIPITOR) 20 MG tablet Take 1 tablet (20 mg total) by mouth daily at 6 PM. 05/04/19  Yes Mayo, Pete Pelt, MD  carvedilol (COREG) 3.125 MG tablet Take 1 tablet (3.125 mg total) by mouth 2 (two) times daily with a meal. 05/04/19  Yes Mayo, Pete Pelt, MD  docusate sodium (COLACE) 100 MG capsule Take 1 capsule (100 mg total) by mouth 2 (two) times daily. 05/04/19  Yes Mayo, Pete Pelt, MD  dorzolamide-timolol (COSOPT) 22.3-6.8 MG/ML ophthalmic solution Place 1 drop into both eyes 2 (two) times daily.    Yes [provider]  Fluticasone-Salmeterol (ADVAIR DISKUS) 250-50 MCG/DOSE AEPB Inhale 1 puff into the lungs daily.    Yes [provider]  furosemide (LASIX) 40 MG tablet Take 1 tablet (40 mg total) by mouth daily. 10/19/18  Yes Bettey Costa, MD  levothyroxine (SYNTHROID) 88 MCG tablet Take 1 tablet (88 mcg total) by mouth daily at 6 (six) AM. 05/05/19  Yes Mayo, Pete Pelt, MD  multivitamin-iron-minerals-folic acid (CENTRUM) chewable tablet Chew 1 tablet by mouth daily.    Yes [provider]  potassium chloride (K-DUR) 10 MEQ tablet Take 10 mEq by mouth daily.    Yes [provider]  Travoprost, BAK Free, (TRAVATAN Z) 0.004 % SOLN ophthalmic solution PLACE 1 DROP INTO BOTH EYES ONCE DAILY 01/25/18  Yes [provider]  albuterol (PROAIR HFA) 108 (90 BASE) MCG/ACT inhaler Inhale 2 puffs into the lungs every 6 (six) hours as needed.     [provider]  feeding supplement, ENSURE ENLIVE, (ENSURE ENLIVE) LIQD Take 237 mLs by mouth 2 (two) times  daily between meals. 05/04/19   Mayo, Pete Pelt, MD   No Known Allergies Review of Systems  All other systems reviewed and are negative.   Physical Exam Pulmonary:     Effort: Pulmonary effort is normal.  Skin:    General: Skin is warm and dry.  Neurological:     Mental Status: He is alert.     Vital Signs: BP 113/70 (BP Location: Left Arm)   Pulse 70   Temp 97.6 F (36.4 C) (Oral)   Resp 18   Ht _0  (1.676 m)   Wt 70.2 kg   SpO2 100%   BMI 24.99 kg/m  Pain Scale: 0-10   Pain Score: 0-No pain   SpO2: SpO2: 100 % O2 Device:SpO2: 100 % O2 Flow Rate: .O2 Flow Rate (L/min): 3 L/min  IO: Intake/output summary:   Intake/Output Summary (Last 24 hours) at 05/14/2019 1016 Last data filed at 05/14/2019 0259 Gross per 24 hour  Intake 500 ml  Output 0 ml  Net 500 ml    LBM: Last BM Date: 05/13/19 Baseline Weight: Weight: 72.6 kg Most recent weight: Weight: 70.2 kg     Palliative Assessment/Data:      Time In: 9:15 Time Out: 10:30 Time Total: 1 hour 15 min Greater than 50%  of this time was spent counseling and coordinating care related to the above assessment and plan.  Signed by: Asencion Gowda, NP   Please contact Palliative Medicine Team phone at [phone] for questions and concerns.  For individual provider: See Shea Evans

## 2019-05-14 NOTE — Progress Notes (Signed)
Hypoglycemic Event  CBG: 38  Treatment: 50 ml dextrose 50% solution 25 g   Symptoms: Pt asleep   Follow-up CBG: [id number] CBG Result:71  Possible Reasons for Event: Pt NPO, lethargic   Comments/MD notified:Dr.Konidena notified, orders put in.     Gabriel Delacruz

## 2019-05-15 LAB — URINALYSIS, COMPLETE (UACMP) WITH MICROSCOPIC
Bacteria, UA: NONE SEEN
Bilirubin Urine: NEGATIVE
Glucose, UA: NEGATIVE mg/dL
Hgb urine dipstick: NEGATIVE
Ketones, ur: NEGATIVE mg/dL
Leukocytes,Ua: NEGATIVE
Nitrite: NEGATIVE
Protein, ur: 30 mg/dL — AB
Specific Gravity, Urine: 1.015 (ref 1.005–1.030)
Squamous Epithelial / HPF: NONE SEEN (ref 0–5)
pH: 5 (ref 5.0–8.0)

## 2019-05-15 LAB — GLUCOSE, CAPILLARY
Glucose-Capillary: 114 mg/dL — ABNORMAL HIGH (ref 70–99)
Glucose-Capillary: 92 mg/dL (ref 70–99)
Glucose-Capillary: 100 mg/dL — ABNORMAL HIGH (ref 70–99)

## 2019-05-15 MED ORDER — IPRATROPIUM-ALBUTEROL 0.5-2.5 (3) MG/3ML IN SOLN
3.0000 mL | Freq: Two times a day (BID) | RESPIRATORY_TRACT | Status: DC
  Administered 2019-05-15 – 2019-05-16 (×2): 3 mL via RESPIRATORY_TRACT
  Filled 2019-05-15 (×2): qty 3

## 2019-05-15 NOTE — Progress Notes (Signed)
Daily Progress Note   Patient Name: Gabriel Delacruz       Date: 05/15/2019 DOB: [birth year]  Age: 85 y.o. MRN#: [id number] Attending Physician: Epifanio Lesches, MD Primary Care Physician: Marinda Elk, MD Admit Date: 05/11/2019  Reason for Consultation/Follow-up: Terminal Care  Subjective: Patient resting in bed. He has removed his oxygen. He is alert, no distress noted, and denies complaint. Plans for D/C home with hospice today.    Length of Stay: 4  Current Medications: Scheduled Meds:  . dorzolamide-timolol  1 drop Both Eyes BID  . enoxaparin (LOVENOX) injection  1 mg/kg Subcutaneous Q24H  . ipratropium-albuterol  3 mL Nebulization TID  . latanoprost  1 drop Both Eyes QHS  . mouth rinse  15 mL Mouth Rinse BID    Continuous Infusions: . sodium chloride Stopped (05/14/19 1226)  . cefTRIAXone (ROCEPHIN)  IV Stopped (05/14/19 1003)  . dextrose 50 mL/hr at 05/15/19 0600    PRN Meds: sodium chloride, acetaminophen **OR** acetaminophen, albuterol, bisacodyl, morphine injection, ondansetron **OR** ondansetron (ZOFRAN) IV  Physical Exam Pulmonary:     Effort: Pulmonary effort is normal.  Skin:    General: Skin is warm and dry.  Neurological:     Mental Status: He is alert.             Vital Signs: BP 98/73 (BP Location: Left Arm)   Pulse 70   Temp (!) 97.1 F (36.2 C)   Resp 16   Ht 5\' 6"  (1.676 m)   Wt 69.7 kg   SpO2 99%   BMI 24.80 kg/m  SpO2: SpO2: 99 % O2 Device: O2 Device: Nasal Cannula O2 Flow Rate: O2 Flow Rate (L/min): 3 L/min  Intake/output summary:   Intake/Output Summary (Last 24 hours) at 05/15/2019 0941 Last data filed at 05/15/2019 [id number] Gross per 24 hour  Intake 993.86 ml  Output 465 ml  Net 528.86 ml   LBM: Last BM Date: 05/13/19  Baseline Weight: Weight: 72.6 kg Most recent weight: Weight: 69.7 kg       Palliative Assessment/Data: 30% at best      Patient Active Problem List   Diagnosis Date Noted  . Acute kidney injury ([place]) 05/11/2019  . Pressure injury of skin 04/28/2019  . Cough   . Goals of care, counseling/discussion   . Palliative care  by specialist   . DNR (do not resuscitate) discussion   . Acute exacerbation of CHF (congestive heart failure) ([place]) 04/11/2019  . Severe sinus bradycardia 10/13/2018  . MGUS (monoclonal gammopathy of unknown significance) 12/11/2015    Palliative Care Assessment & Plan    Recommendations/Plan:  Home with hospice today.    Code Status:    Code Status Orders  (From admission, onward)         Start     Ordered   05/11/19 1623  Do not attempt resuscitation (DNR)  Continuous    Question Answer Comment  In the event of cardiac or respiratory ARREST Do not call a "code blue"   In the event of cardiac or respiratory ARREST Do not perform Intubation, CPR, defibrillation or ACLS   In the event of cardiac or respiratory ARREST Use medication by any route, position, wound care, and other measures to relive pain and suffering. May use oxygen, suction and manual treatment of airway obstruction as needed for comfort.   Comments NMP      05/11/19 1625        Code Status History    Date Active Date Inactive Code Status Order ID Comments User Context   04/25/2019 0956 05/04/2019 2220 DNR [id number]  Dustin Flock, MD Inpatient   04/11/2019 2144 04/25/2019 0956 Full Code [id number]  Mayer Camel, NP ED   10/13/2018 2302 10/19/2018 2134 Full Code [id number]  Demetrios Loll, MD Inpatient   Advance Care Planning Activity    Advance Directive Documentation     Most Recent Value  Type of Advance Directive  Out of facility DNR (pink MOST or yellow form)  Pre-existing out of facility DNR order (yellow form or pink MOST form)  -  "MOST" Form in Place?  -       Prognosis:    < 6 weeks  Discharge Planning:  Home with Hospice   Thank you for allowing the Palliative Medicine Team to assist in the care of this patient.   Total Time 35 min Prolonged Time Billed  no      Greater than 50%  of this time was spent counseling and coordinating care related to the above assessment and plan.  Asencion Gowda, NP  Please contact Palliative Medicine Team phone at [phone] for questions and concerns.

## 2019-05-15 NOTE — Progress Notes (Signed)
Notified by CCMD of 13 beat run VT.  Patient resting on assessment.  Arouses but dozes quickly.  Unable to obtain oxygen saturations.  Different oxygen saturation probes/machines used. Hands cool, oxygen at [facility].

## 2019-05-15 NOTE — Progress Notes (Signed)
Follow up visit made to new referral for [facility] hospice services at home. Patient seen sitting up in bed, currently on 3 liters of oxygen, patient was awake, speech difficult to understand. He remains on D10 IV fluids. Daughter [name] and son [name] present, discussed discharge plan. All DME is in place. After some discussion, they would like for [name] to discharge TOMORROW morning. Patient's other son is arriving late this afternoon from [place] and they would like all three of them to be available in the home to provide care. Patient will require EMS transport with signed out of facility DNR in place. Updated notes faxed to referral. CSW Evette Cristal made aware of family request. Will continue to follow through discharge. Flo Shanks BSN, RN, [facility] [phone]

## 2019-05-15 NOTE — Progress Notes (Addendum)
[place] at [employer] NAME: Gabriel Delacruz    MR#:  [id number]  DATE OF BIRTH:  10-26-1927  Slightly more alert today, the palliative care team, hospice, possible discharge home tomorrow with hospice.  CHIEF COMPLAINT:   Chief Complaint  Patient presents with  . Shortness of Breath    REVIEW OF SYSTEMS:   ROS   Unable to obtain review of systems because of lethargy. No Known Allergies  VITALS:  Blood pressure 98/73, pulse 70, temperature (!) 97.1 F (36.2 C), resp. rate 16, height 5\' 6"  (1.676 m), weight 69.7 kg, SpO2 96 %.  PHYSICAL EXAMINATION:  GENERAL:  85 y.o.-year-old patient lying in the bed with no acute distress.  Unresponsive EYES: Pupils equal, round, reactive to light  No scleral icterus.HEENT: Head atraumatic, normocephalic. Oropharynx and nasopharynx clear.  NECK:  Supple, no jugular venous distention. No thyroid enlargement, no tenderness.  LUNGS:  diminished breath sounds bilaterally.  CARDIOVASCULAR: S1, S2 normal. No murmurs, rubs, or gallops.  ABDOMEN: Soft, nontender, nondistended. Bowel sounds present. No organomegaly or mass.  EXTREMITIES: No pedal edema, cyanosis, or clubbing.  NEUROLOGIC: patient is minimally responsive, not following commands to do full neuro exam.    PSYCHIATRIC: MINIMALLY RESPONSIVE.  SKIN: No obvious rash, lesion, or ulcer.    LABORATORY PANEL:   CBC Recent Labs  Lab 05/12/19 0447  WBC 4.5  HGB 15.2  HCT 45.1  PLT 113*   ------------------------------------------------------------------------------------------------------------------  Chemistries  Recent Labs  Lab 05/13/19 0521  NA 138  K 4.8  CL 99  CO2 26  GLUCOSE 103*  BUN 86*  CREATININE 1.65*  CALCIUM 8.7*   ------------------------------------------------------------------------------------------------------------------  Cardiac Enzymes No results for input(s): TROPONINI in the last 168  hours. ------------------------------------------------------------------------------------------------------------------  RADIOLOGY:  No results found.  EKG:   Orders placed or performed during the hospital encounter of 05/11/19  . EKG 12-Lead  . EKG 12-Lead  . EKG 12-Lead  . EKG 12-Lead  . ED EKG  . ED EKG    ASSESSMENT AND PLAN:   85 year old male with history of chronic systolic heart failure with EF 25 to 30%, chronic atrial fibrillation on Eliquis, recently discharged from hospital to [place] comes back yesterday because of lethargy. 1.  Lethargy with metabolic encephalopathy secondary to acute kidney injury, not eating well, started on gentle hydration, renal function is improving 2.  Chronic atrial fibrillation, patient is on telemetry, continue Lovenox, discontinue Eliquis as patient is n.p.o. cannot give Eliquis. 3.  Metabolic encephalopathy ammonia level is normal ABG is normal, UA is done, today, has no evidence of UTI.Marland Kitchen  Head CT unremarkable. 4.  Chronic systolic heart failure , acute kidney injury, unable to give IV Lasix secondary to renal failure,.  Appreciate palliative care following the patient.  5.  Reactive airway disease: Continued wheezing, use DuoNeb's.  #6 hypoglycemia, started on D10 infusion, seen by palliative care, hospice liaison, patient family want to take him home tomorrow with hospice waiting for another family member to come, equipment for the hospice delivered to home, hospice nurse will be there tomorrow afternoon at home, 85 year old male with multiple medical problems of permanent atrial fibrillation, chronic systolic heart failure, chronic gout, deconditioning, now with renal failure and poorly responsive, will discharge to hospice at home when arrangements are made continue, continue D10 infusion for now. All the records are reviewed and case discussed with Care Management/Social Workerr. Management plans discussed with the patient, family  and they are  in agreement.  CODE STATUS: DNR  TOTAL TIME TAKING CARE OF THIS PATIENT:38 minutes.  More than 50% of time spent in counseling, coordination of care. POSSIBLE D/C IN 1-2DAYS, DEPENDING ON CLINICAL CONDITION.   Epifanio Lesches M.D on 05/15/2019 at 2:25 PM  Between 7am to 6pm - Pager - [phone]  After 6pm go to [email or web address] - password [email or web address] [facility]  Office  [phone]  CC: Primary care physician; Marinda Elk, MD   Note: This dictation was prepared with Dragon dictation along with smaller phrase technology. Any transcriptional errors that result from this process are unintentional.

## 2019-05-15 NOTE — Progress Notes (Signed)
MEWS Guidelines - (patients age 83 and over)  Red - At High Risk for Deterioration Yellow - At risk for Deterioration  1. Go to room and assess patient 2. Validate data. Is this patient's baseline? If data confirmed: 3. Is this an acute change? 4. Administer prn meds/treatments as ordered. 5. Note Sepsis score 6. Review goals of care 7. Sports coach, RRT nurse and Provider. 8. Ask Provider to come to bedside.  9. Document patient condition/interventions/response. 10. Increase frequency of vital signs and focused assessments to at least q15 minutes x 4, then q30 minutes x2. - If stable, then q1h x3, then q4h x3 and then q8h or dept. routine. - If unstable, contact Provider & RRT nurse. Prepare for possible transfer. 11. Add entry in progress notes using the smart phrase ".MEWS". 1. Go to room and assess patient 2. Validate data. Is this patient's baseline? If data confirmed: 3. Is this an acute change? 4. Administer prn meds/treatments as ordered? 5. Note Sepsis score 6. Review goals of care 7. Sports coach and Provider 8. Call RRT nurse as needed. 9. Document patient condition/interventions/response. 10. Increase frequency of vital signs and focused assessments to at least q2h x2. - If stable, then q4h x2 and then q8h or dept. routine. - If unstable, contact Provider & RRT nurse. Prepare for possible transfer. 11. Add entry in progress notes using the smart phrase ".MEWS".  Green - Likely stable [place] - Comfort Care Only  1. Continue routine/ordered monitoring.  2. Review goals of care. 1. Continue routine/ordered monitoring. 2. Review goals of care.   Patient is listed as a MEWS of a 2 due to the fact that his blood pressure is elevated and is not being treated due to the fact that he is going to be going home with hospice tomorrow.  Patient was ordered comfort fluids this day.  Patient is receiving D10 as IVF but is nonverbal and this has been his baseline,  making his other score of a one on the MEWS scale.

## 2019-05-16 LAB — GLUCOSE, CAPILLARY
Glucose-Capillary: 42 mg/dL — CL (ref 70–99)
Glucose-Capillary: 73 mg/dL (ref 70–99)

## 2019-05-16 MED ORDER — IPRATROPIUM-ALBUTEROL 0.5-2.5 (3) MG/3ML IN SOLN: 3.0000 mL | RESPIRATORY_TRACT | Status: DC | PRN

## 2019-05-16 MED ORDER — GLUCOSE 40 % PO GEL
1.0000 | Freq: Once | ORAL | Status: AC
  Administered 2019-05-16: 11:00:00 37.5 g via ORAL
  Filled 2019-05-16: qty 1

## 2019-05-16 NOTE — TOC Progression Note (Signed)
Transition of Care [facility]) - Progression Note    Patient Details  Name: Gabriel Delacruz MRN: [id number] Date of Birth: 02-28-1928  Transition of Care [facility]) CM/SW Contact  Ross Ludwig, [place] Phone Number: 05/16/2019, 1:59 PM  Clinical Narrative:    Patient is an 85 year old male who is alert and oriented x1.  Patient was at [facility] for short term rehab, but then was readmitted to the hospital.  Once patient was readmdmitted to the hospital, PT tried to work with him, he was unable to improve.  Patient has had a deterioration, and family have decided to go home with hospice.     Barriers to Discharge: No Barriers Identified  Expected Discharge Plan and Services  Patient will be discharging home with hospice services through [facility].     Post Acute Care Choice: Hospice Living arrangements for the past 2 months: [place], [place] Expected Discharge Date: 05/16/19               DME Arranged: Hospital bed, Oxygen DME Agency: Other - Comment([facility]) Date DME Agency Contacted: 05/15/19 Time DME Agency Contacted: [id number] Representative spoke with at DME Agency: Santiago Glad at [place]: [facility] Date [place]: 05/15/19 Time [place]: 1357 Representative spoke with at [place]: [place] ([place]) Interventions    Readmission Risk Interventions Readmission Risk Prevention Plan 04/12/2019  Medication Screening Complete  Transportation Screening Complete  Some recent data might be hidden

## 2019-05-16 NOTE — TOC Transition Note (Signed)
Transition of Care [facility]) - CM/SW Discharge Note   Patient Details  Name: Gabriel Delacruz MRN: [id number] Date of Birth: 06-02-1928  Transition of Care [facility]) CM/SW Contact:  Ross Ludwig, LCSW Phone Number: 05/16/2019, 2:09 PM   Clinical Narrative:     Patient to be d/c'ed today to home with Hospice Patient and family agreeable to plans will transport via ems.  Patient's son was at bedside and is aware of patient's discharge back home with hospice.        Final next level of care: Home w Hospice Care Barriers to Discharge: No Barriers Identified   Patient Goals and CMS Choice Patient states their goals for this hospitalization and ongoing recovery are:: \ CMS Medicare.gov Compare Post Acute Care list provided to:: Patient Represenative (must comment) Choice offered to / list presented to : Adult Children  Discharge Placement  Patient discharging back home with hospice services and family members.  Equipment was delivered to patient's home yesterday.                     Discharge Plan and Services     Post Acute Care Choice: Hospice          DME Arranged: Hospital bed, Oxygen DME Agency: Other - Comment([facility]) Date DME Agency Contacted: 05/15/19 Time DME Agency Contacted: [phone] Representative spoke with at DME Agency: Santiago Glad at [place]: [facility] Date [place]: 05/15/19 Time [place]: 1357 Representative spoke with at [place]: [place] ([place]) Interventions     Readmission Risk Interventions Readmission Risk Prevention Plan 04/12/2019  Medication Screening Complete  Transportation Screening Complete  Some recent data might be hidden

## 2019-05-16 NOTE — Progress Notes (Signed)
Gabriel Delacruz to be D/C'd Home with hospice per MD order.  Discussed prescriptions and follow up appointments with the patient. Prescriptions given to patient, medication list explained in detail. Pt and sons  verbalized understanding.  Allergies as of 05/16/2019   No Known Allergies     Medication List    STOP taking these medications   Advair Diskus 250-50 MCG/DOSE Aepb Generic drug: Fluticasone-Salmeterol   apixaban 5 MG Tabs tablet Commonly known as: ELIQUIS   atorvastatin 20 MG tablet Commonly known as: LIPITOR   carvedilol 3.125 MG tablet Commonly known as: COREG   docusate sodium 100 MG capsule Commonly known as: COLACE   feeding supplement (ENSURE ENLIVE) Liqd   furosemide 40 MG tablet Commonly known as: LASIX   levothyroxine 88 MCG tablet Commonly known as: SYNTHROID   multivitamin-iron-minerals-folic acid chewable tablet   potassium chloride 10 MEQ tablet Commonly known as: KLOR-CON   ProAir HFA 108 (90 Base) MCG/ACT inhaler Generic drug: albuterol     TAKE these medications   dorzolamide-timolol 22.3-6.8 MG/ML ophthalmic solution Commonly known as: COSOPT Place 1 drop into both eyes 2 (two) times daily.   Travatan Z 0.004 % Soln ophthalmic solution Generic drug: Travoprost (BAK Free) PLACE 1 DROP INTO BOTH EYES ONCE DAILY       Vitals:   05/16/19 0836 05/16/19 0838  BP:  102/69  Pulse: 66 72  Resp: 18 17  Temp:  (!) 97.5 F (36.4 C)  SpO2: 90% 94%    Tele box removed and returned.Skin clean, dry and intact without evidence of skin break down, no evidence of skin tears noted. IV catheter discontinued intact. Site without signs and symptoms of complications. Dressing and pressure applied. Pt denies pain at this time. No complaints noted.  An After Visit Summary was printed and given to the patient. Patient escorted via stretcher and D/C home via EMS.  Rolley Sims

## 2019-05-16 NOTE — Progress Notes (Signed)
Hypoglycemic Event  CBG: 42  Treatment: 2 tubes glucose gel  Symptoms: None  Follow-up CBG: Time:1130 CBG Result:73  Possible Reasons for Event: Inadequate meal intake  Comments/MD notified:Dr. Rennis Chris

## 2019-05-16 NOTE — Progress Notes (Signed)
Follow up visit made. Patient seen lying in bed, sons [name] and [name] present.During visit patient's blood sugar checked at family request, blood sugar was 42. Staff Salley Scarlet present. Patient's  IV had come out and D 10 fluids had to be stopped. Family discussed options of restarting a new IV or using glucose gel, after talking with their sister, patient's daughter [name], family chose to use the glucose gel. Staff RN Danae Chen made aware of family choice.  Plan is for patient to discharge home today. Emotional support given. Patient resting and appeared comfortable at end of visit. Signed DNR in place in discharge packet. Staff RN Danae Chen to call EMS for transport. Flo Shanks BSN, [place], [place] [facility] [phone]

## 2019-05-16 NOTE — Discharge Summary (Signed)
Gabriel Delacruz, is a 85 y.o. male  DOB 1928-06-16  MRN [id number].  Admission date:  05/11/2019  Admitting Physician  Epifanio Lesches, MD  Discharge Date:  05/16/2019   Primary MD  Marinda Elk, MD  Recommendations for primary care physician for things to follow:  Discharged to hospice at home   Admission Diagnosis  Acute on chronic systolic congestive heart failure Gabriel Delacruz) [I50.23]   Discharge Diagnosis  Acute on chronic systolic congestive heart failure ([place]) [I50.23]    Active Problems:   Acute kidney injury [facility])      Past Medical History:  Diagnosis Date  . Anemia   . Atrial fibrillation ([place])   . CHF (congestive heart failure) ([place])   . COPD (chronic obstructive pulmonary disease) ([place])   . GERD (gastroesophageal reflux disease)   . Glaucoma   . Hyperlipemia   . Hypertension   . Hypothyroidism   . Irregular heart rhythm   . Left anterior fascicular block   . MGUS (monoclonal gammopathy of unknown significance)   . RBBB     Past Surgical History:  Procedure Laterality Date  . Cervical and lumbar disc surgeries    . Cryoablation left renal mass    . PACEMAKER LEADLESS INSERTION N/A 10/16/2018   Procedure: PACEMAKER LEADLESS INSERTION;  Surgeon: Isaias Cowman, MD;  Location: [place] [facility];  Service: Cardiovascular;  Laterality: N/A;  . TEMPORARY PACEMAKER Left 10/16/2018   Procedure: TEMPORARY PACEMAKER;  Surgeon: Isaias Cowman, MD;  Location: [place] [facility];  Service: Cardiovascular;  Laterality: Left;  . TONSILLECTOMY         History of present illness and  Hospital Course:     Kindly see H&P for history of present illness and admission details, please review complete Labs, Consult reports and Test reports for all details in brief  HPI  from the history and  physical done on the day of admission  85 year old male with history of chronic systolic heart failure with EF 25 to 30%, permanent atrial fibrillation on Eliquis functionality, nursing home because of lethargy  Hospital Course  #1 metabolic encephalopathy secondary to acute kidney injury, hypoglycemia, patient received aggressive IV hydration, D10 infusion for hypoglycemia.  Metabolic encephalopathy work-up including CT head, UA for infection were all negative for any acute problem.  Patient continued to have altered mental status, family chose comfort measures and want to take him home with hospice today.  Seen by palliative care, hospice liaison.  Appreciate their help. 2.  Reactive airway disease patient received a nebulizer treatment. 3.  .Acute kidney injury, received IV fluids in the hospital. 4.  Chronic atrial fibrillation, patient is on telemetry, received Lovenox instead of Eliquis secondary to patient being lethargic and also n.p.o. status in the hospital.  Patient will be discharged home with hospice today.  Continue eyedrops at home.   Discharge Condition: Stable  Follow UP  Follow-up Information    Marinda Elk, MD. Go on 05/22/2019.   Specialty: Physician Assistant Why: APPOINTMENT AT 10:30AM Contact information: [place] [address] [phone]             Discharge Instructions  and  Discharge Medications      Allergies as of 05/16/2019   No Known Allergies     Medication List    STOP taking these medications   Advair Diskus 250-50 MCG/DOSE Aepb Generic drug: Fluticasone-Salmeterol   apixaban 5 MG Tabs tablet Commonly known as: ELIQUIS   atorvastatin  20 MG tablet Commonly known as: LIPITOR   carvedilol 3.125 MG tablet Commonly known as: COREG   docusate sodium 100 MG capsule Commonly known as: COLACE   feeding supplement (ENSURE ENLIVE) Liqd   furosemide 40 MG tablet Commonly known as: LASIX   levothyroxine  88 MCG tablet Commonly known as: SYNTHROID   multivitamin-iron-minerals-folic acid chewable tablet   potassium chloride 10 MEQ tablet Commonly known as: KLOR-CON   ProAir HFA 108 (90 Base) MCG/ACT inhaler Generic drug: albuterol     TAKE these medications   dorzolamide-timolol 22.3-6.8 MG/ML ophthalmic solution Commonly known as: COSOPT Place 1 drop into both eyes 2 (two) times daily.   Travatan Z 0.004 % Soln ophthalmic solution Generic drug: Travoprost (BAK Free) PLACE 1 DROP INTO BOTH EYES ONCE DAILY         Diet and Activity recommendation: See Discharge Instructions above   Consults obtained -to care, hospice   Major procedures and Radiology Reports - PLEASE review detailed and final reports for all details, in brief -      Dg Chest 1 View  Result Date: 05/11/2019 CLINICAL DATA:  pt has lower extremely edema, increased SOB, facility reported being unable to keep oxygen sat up. Ems states facility nurse reported pt up and walking and talking few days ago. On arrival to ed pt unable to respond verbally. Hx of hypertension, COPD, CHF, AFIB, pacemaker. Former smoker EXAM: CHEST  1 VIEW COMPARISON:  04/25/2019 and older exams. FINDINGS: Mild enlargement of the cardiac silhouette. No mediastinal or hilar masses. Right greater than left lung base opacities. Mid and upper lungs are clear. No pneumothorax. Skeletal structures are grossly intact. IMPRESSION: 1. Basilar opacities now partly obscure the hemidiaphragms, mildly improved from the previous exam, consistent with a combination of small effusions and atelectasis. 2. No new lung abnormalities.  No pulmonary edema. Electronically Signed   By: Lajean Manes M.D.   On: 05/11/2019 11:03   Ct Head Wo Contrast  Result Date: 05/12/2019 CLINICAL DATA:  Fatigue and malaise.  Lethargy. EXAM: CT HEAD WITHOUT CONTRAST TECHNIQUE: Contiguous axial images were obtained from the base of the skull through the vertex without intravenous  contrast. COMPARISON:  CT head 04/20/2019 FINDINGS: Brain: Moderate atrophy, stable. Asymmetric white matter hypodensity in the left frontal lobe is stable. Probable chronic ischemia. Additional areas of chronic ischemia in the white matter bilaterally Negative for acute cortical infarct, hemorrhage, mass. Vascular: Atherosclerotic calcification cavernous carotid and middle cerebral arteries bilaterally. Negative for hyperdense vessel. Skull: Negative Sinuses/Orbits: Negative Other: None IMPRESSION: No acute abnormality and no change since 04/20/2019 Moderate atrophy with chronic ischemic changes. White matter ischemia asymmetric in the left frontal lobe is unchanged from the prior study. Hello okay Electronically Signed   By: Franchot Gallo M.D.   On: 05/12/2019 13:44   Ct Head Wo Contrast  Result Date: 04/20/2019 CLINICAL DATA:  Altered mental status. EXAM: CT HEAD WITHOUT CONTRAST TECHNIQUE: Contiguous axial images were obtained from the base of the skull through the vertex without intravenous contrast. COMPARISON:  None FINDINGS: Brain: Hypoattenuation in the left frontal operculum and involving the anterior insular ribbon is likely remote with some evidence for volume loss occluding ex vacuo dilation of the left lateral ventricle. Moderate diffuse atrophy and white matter disease is present otherwise. Basal ganglia are intact. The remainder of the left insular ribbon and the entire right insular ribbon are intact. The brainstem and cerebellum are within normal limits. No significant extraaxial fluid collection is present. Vascular:  Diffuse vascular calcifications are present. There are calcifications in the distal M1 segments bilaterally. Dense calcifications are present in the cavernous internal carotid arteries and at the dural margin of both vertebral arteries. Skull: Calvarium is intact. No focal lytic or blastic lesions are present. Sinuses/Orbits: The paranasal sinuses and mastoid air cells are clear.  Bilateral lens replacements are noted. Globes and orbits are otherwise unremarkable. IMPRESSION: 1. 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 2. Atrophy and white matter disease without other focal infarct. 3. Atherosclerosis Electronically Signed   By: San Morelle M.D.   On: 04/20/2019 18:15   US Carotid Bilateral  Result Date: 04/22/2019 CLINICAL DATA:  Dizziness. History of CAD, hypertension, hyperlipidemia and smoking. EXAM: BILATERAL CAROTID DUPLEX ULTRASOUND TECHNIQUE: Pearline Cables scale imaging, color Doppler and duplex ultrasound were performed of bilateral carotid and vertebral arteries in the neck. COMPARISON:  None. FINDINGS: Criteria: Quantification of carotid stenosis is based on velocity parameters that correlate the residual internal carotid diameter with NASCET-based stenosis levels, using the diameter of the distal internal carotid lumen as the denominator for stenosis measurement. The following velocity measurements were obtained: RIGHT ICA: 86/16 cm/sec CCA: [id number] cm/sec SYSTOLIC ICA/CCA RATIO:  1.8 ECA: 24 cm/sec LEFT ICA: 79/18 cm/sec CCA: [id number] cm/sec SYSTOLIC ICA/CCA RATIO:  2.2 ECA: 84 cm/sec RIGHT CAROTID ARTERY: There is a minimal amount of intimal thickening/atherosclerotic plaque seen throughout the right common carotid artery (images 3, 7 and 11). There is a moderate amount of eccentric echogenic partially shadowing plaque within the right carotid bulb (image 15), extending to involve the origin and proximal aspects of the right internal carotid artery (image 23), not resulting in elevated peak systolic velocities within the interrogated course of the right internal carotid artery to suggest a hemodynamically significant stenosis. RIGHT VERTEBRAL ARTERY:  Antegrade flow LEFT CAROTID ARTERY: There is a minimal amount of intimal thickening seen throughout the left  common carotid artery (representative image 44). There is a moderate amount of eccentric echogenic partially shadowing plaque within the left carotid bulb (images 47 and 48), extending to involve the origin and proximal aspects of the left internal carotid artery (image 55), not resulting in elevated peak systolic velocities within the interrogated course of the left internal carotid artery to suggest a hemodynamically significant stenosis. LEFT VERTEBRAL ARTERY:  Antegrade flow Note is made of a cardiac arrhythmia (representative image 50). IMPRESSION: 1. Moderate amount of bilateral atherosclerotic plaque, right subjectively greater than left, not resulting in a hemodynamically significant stenosis within either internal artery. 2. Incidental made of a cardiac arrhythmia. Further evaluation with ECG monitoring could be performed as indicated Electronically Signed   By: Sandi Mariscal M.D.   On: 04/22/2019 08:25   Dg Chest Port 1 View  Result Date: 04/25/2019 CLINICAL DATA:  Initial evaluation for acute CHF. EXAM: PORTABLE CHEST 1 VIEW COMPARISON:  Prior radiograph from 04/20/2019. FINDINGS: Moderate cardiomegaly, stable. Mediastinal silhouette within normal limits. Lung volumes are reduced. Veiling opacities overlying the bilateral hemidiaphragms most consistent with pleural effusions. Perihilar vascular congestion with mild interstitial prominence without frank pulmonary edema. Superimposed bibasilar opacities likely reflect atelectasis and/or effusions. Infiltrates would be difficult to exclude, and could be considered in the correct clinical setting. No pneumothorax. No acute osseous finding. Degenerative changes noted about the shoulders bilaterally, right greater than left. IMPRESSION: 1. Veiling opacities overlying the bilateral hemidiaphragms, most consistent with pleural effusions. Superimposed bibasilar  opacities likely reflect atelectasis and/or effusions. Infiltrates would be difficult to exclude, and  could be considered in the correct clinical setting. 2. Cardiomegaly with perihilar vascular congestion and interstitial prominence without frank pulmonary edema. Electronically Signed   By: Jeannine Boga M.D.   On: 04/25/2019 00:59   Dg Chest Port 1 View  Result Date: 04/20/2019 CLINICAL DATA:  Cough. EXAM: PORTABLE CHEST 1 VIEW COMPARISON:  Radiograph April 11, 2019. FINDINGS: Stable cardiomegaly. No pneumothorax is noted. Atherosclerosis of thoracic aorta is noted. Mild bibasilar atelectasis or infiltrates are noted with small pleural effusions. Bony thorax is unremarkable. IMPRESSION: Mild bibasilar subsegmental atelectasis or infiltrates are noted with associated small pleural effusions. Aortic Atherosclerosis (ICD10-I70.0). Electronically Signed   By: Marijo Conception M.D.   On: 04/20/2019 16:41    Micro Results     Recent Results (from the past 240 hour(s))  SARS Coronavirus 2 [facility] order, Performed in [facility] hospital lab) Nasopharyngeal Nasopharyngeal Swab     Status: None   Collection Time: 05/11/19  2:10 PM   Specimen: Nasopharyngeal Swab  Result Value Ref Range Status   SARS Coronavirus 2 NEGATIVE NEGATIVE Final    Comment: (NOTE) If result is NEGATIVE SARS-CoV-2 target nucleic acids are NOT DETECTED. The SARS-CoV-2 RNA is generally detectable in upper and lower  respiratory specimens during the acute phase of infection. The lowest  concentration of SARS-CoV-2 viral copies this assay can detect is 250  copies / mL. A negative result does not preclude SARS-CoV-2 infection  and should not be used as the sole basis for treatment or other  patient management decisions.  A negative result may occur with  improper specimen collection / handling, submission of specimen other  than nasopharyngeal swab, presence of viral mutation(s) within the  areas targeted by this assay, and inadequate number of viral copies  (<250 copies / mL). A negative result must be combined with  clinical  observations, patient history, and epidemiological information. If result is POSITIVE SARS-CoV-2 target nucleic acids are DETECTED. The SARS-CoV-2 RNA is generally detectable in upper and lower  respiratory specimens dur ing the acute phase of infection.  Positive  results are indicative of active infection with SARS-CoV-2.  Clinical  correlation with patient history and other diagnostic information is  necessary to determine patient infection status.  Positive results do  not rule out bacterial infection or co-infection with other viruses. If result is PRESUMPTIVE POSTIVE SARS-CoV-2 nucleic acids MAY BE PRESENT.   A presumptive positive result was obtained on the submitted specimen  and confirmed on repeat testing.  While 2019 novel coronavirus  (SARS-CoV-2) nucleic acids may be present in the submitted sample  additional confirmatory testing may be necessary for epidemiological  and / or clinical management purposes  to differentiate between  SARS-CoV-2 and other Sarbecovirus currently known to infect humans.  If clinically indicated additional testing with an alternate test  methodology [phone]) is advised. The SARS-CoV-2 RNA is generally  detectable in upper and lower respiratory sp ecimens during the acute  phase of infection. The expected result is Negative. Fact Sheet for Patients:  StrictlyIdeas.no Fact Sheet for Healthcare Providers: BankingDealers.co.za This test is not yet approved or cleared by the [place] FDA and has been authorized for detection and/or diagnosis of SARS-CoV-2 by FDA under an Emergency Use Authorization (EUA).  This EUA will remain in effect (meaning this test can be used) for the duration of the COVID-19 declaration under Section 564(b)(1) of the Act, 21 U.S.C.  section 360bbb-3(b)(1), unless the authorization is terminated or revoked sooner. Performed at [facility], [address] [place], [place] [address]   MRSA PCR Screening     Status: None   Collection Time: 05/12/19  6:29 AM   Specimen: Nasopharyngeal  Result Value Ref Range Status   MRSA by PCR NEGATIVE NEGATIVE Final    Comment:        The GeneXpert MRSA Assay (FDA approved for NASAL specimens only), is one component of a comprehensive MRSA colonization surveillance program. It is not intended to diagnose MRSA infection nor to guide or monitor treatment for MRSA infections. Performed at [facility], [address], [place], [place] [address]        Today   Subjective:   Gabriel Delacruz today is stable for discharge home with hospice  Objective:   Blood pressure 102/69, pulse 72, temperature (!) 97.5 F (36.4 C), temperature source Oral, resp. rate 17, height 5\' 6"  (1.676 m), weight 69.7 kg, SpO2 94 %.   Intake/Output Summary (Last 24 hours) at 05/16/2019 1154 Last data filed at 05/16/2019 0309 Gross per 24 hour  Intake 848.03 ml  Output 0 ml  Net 848.03 ml    Exam Lethargic [place].AT,PERRAL Supple Neck,No JVD, No cervical lymphadenopathy appriciated.  Symmetrical Chest wall movement, Good air movement bilaterally, CTAB RRR,No Gallops,Rubs or new Murmurs, No Parasternal Heave +ve B.Sounds, Abd Soft, Non tender, No organomegaly appriciated, No rebound -guarding or rigidity. Extremity edema present  Data Review   CBC w Diff:  Lab Results  Component Value Date   WBC 4.5 05/12/2019   HGB 15.2 05/12/2019   HGB 14.2 11/07/2014   HCT 45.1 05/12/2019   HCT 42.2 11/07/2014   PLT 113 (L) 05/12/2019   PLT 188 11/07/2014   LYMPHOPCT 37 05/11/2019   LYMPHOPCT 39.0 11/07/2014   MONOPCT 7 05/11/2019   MONOPCT 8.2 11/07/2014   EOSPCT 1 05/11/2019   EOSPCT 10.9 11/07/2014   BASOPCT 1 05/11/2019   BASOPCT 2.7 11/07/2014    CMP:  Lab Results  Component Value Date   NA 138 05/13/2019   NA 137 11/07/2014   K 4.8 05/13/2019   K 4.4 11/07/2014   CL 99 05/13/2019   CL  100 (L) 11/07/2014   CO2 26 05/13/2019   CO2 31 11/07/2014   BUN 86 (H) 05/13/2019   BUN 17 11/07/2014   CREATININE 1.65 (H) 05/13/2019   CREATININE 0.81 11/07/2014   PROT 7.0 04/11/2019   PROT 8.3 (H) 09/30/2011   ALBUMIN 3.6 04/27/2019   ALBUMIN 3.8 09/30/2011   BILITOT 1.5 (H) 04/11/2019   BILITOT 0.6 09/30/2011   ALKPHOS 56 04/11/2019   ALKPHOS 94 09/30/2011   AST 24 04/11/2019   AST 22 09/30/2011   ALT 21 04/11/2019   ALT 31 09/30/2011  .   Total Time in preparing paper work, data evaluation and todays exam - 35 minutes  Epifanio Lesches M.D on 05/16/2019 at 11:54 AM    Note: This dictation was prepared with Dragon dictation along with smaller phrase technology. Any transcriptional errors that result from this process are unintentional.

## 2019-06-17 DEATH — deceased

## 2019-06-25 ENCOUNTER — Other Ambulatory Visit: Payer: Self-pay

## 2019-07-02 ENCOUNTER — Ambulatory Visit: Payer: Self-pay | Admitting: Oncology

## 2019-07-03 ENCOUNTER — Ambulatory Visit: Payer: Medicare HMO | Admitting: Family

## 2021-08-22 IMAGING — CT CT HEAD W/O CM
3 of 4 series · 14 of 47 positions shown, 16 images · non-contrast
Comparison: CT head 04/20/2019

CLINICAL DATA: Fatigue and malaise.  Lethargy.

EXAM:
CT HEAD WITHOUT CONTRAST
TECHNIQUE: Contiguous axial images were obtained from the base of the skull
through the vertex without intravenous contrast.

[Series 2: ax head wo · axial · 0.36mm/px · z∈[+347,+460]mm · 8 of 30 slices shown, 10 images]
[im 3/30  brain]
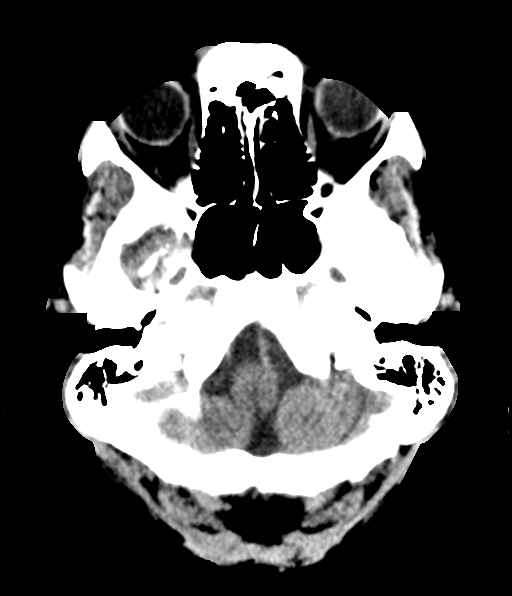
[im 3/30  bone]
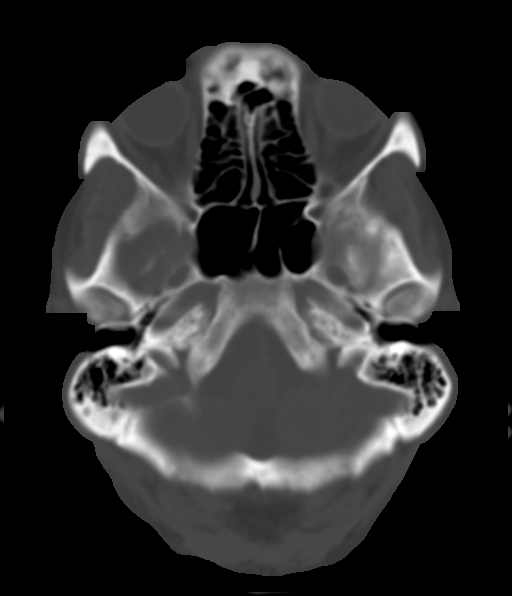
[im 7/30  brain]
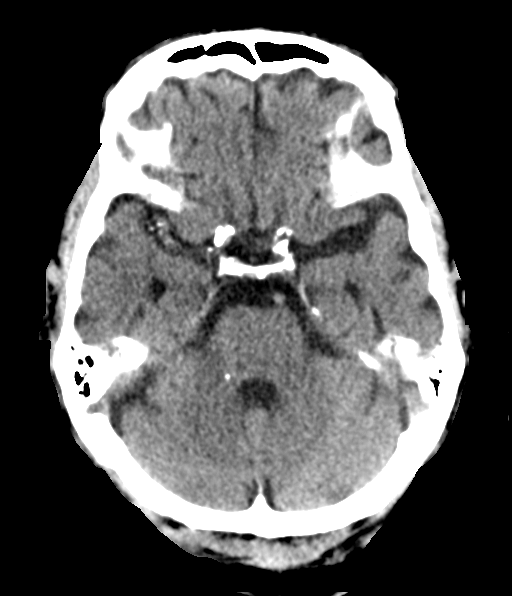
[im 11/30  brain]
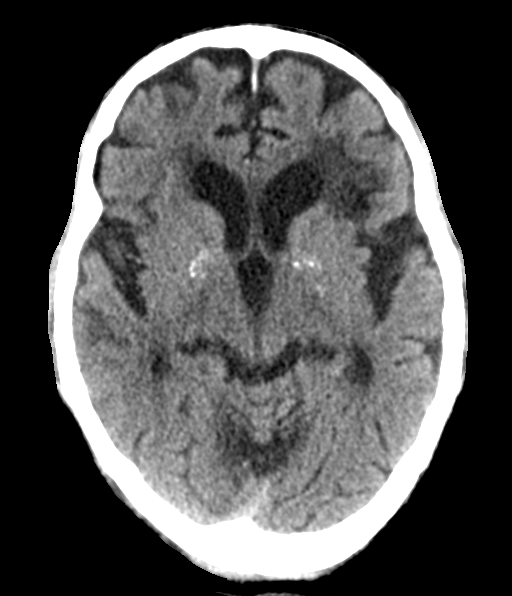
[im 13/30  brain]
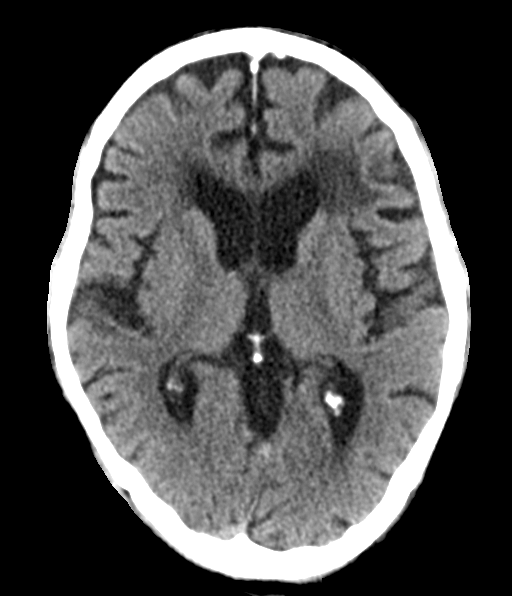
[im 17/30  brain]
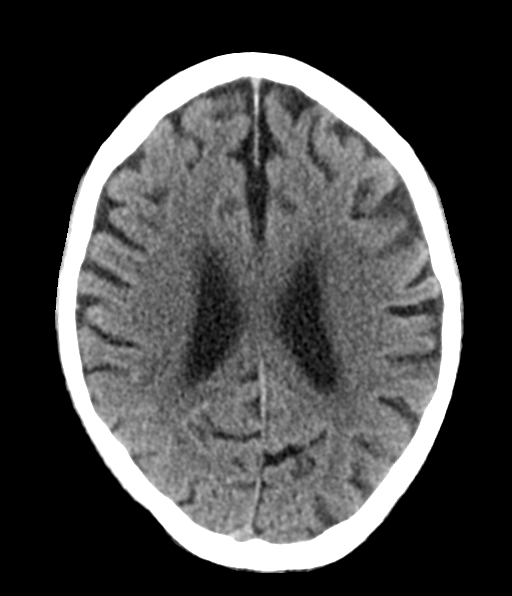
[im 17/30  bone]
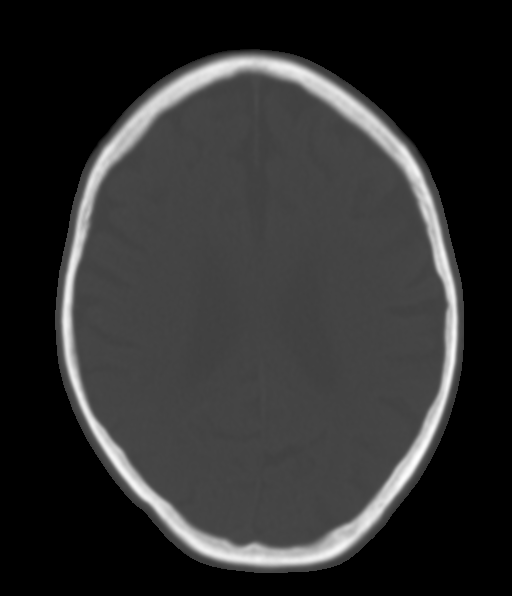
[im 19/30  brain]
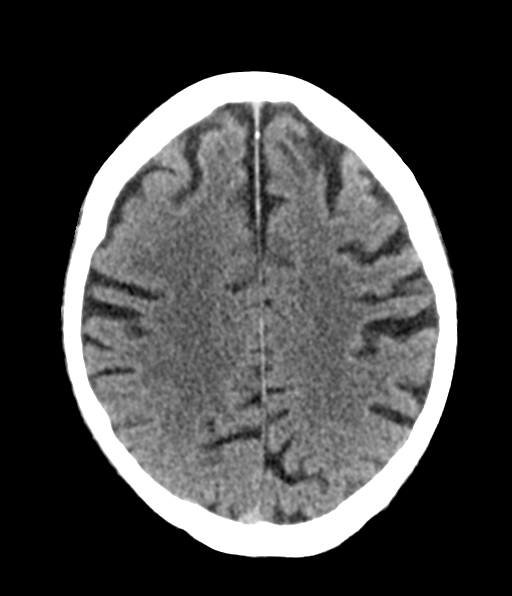
[im 23/30  brain]
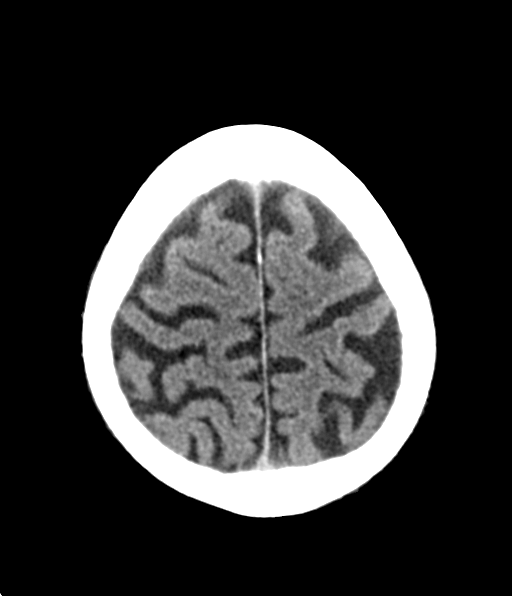
[im 27/30  brain]
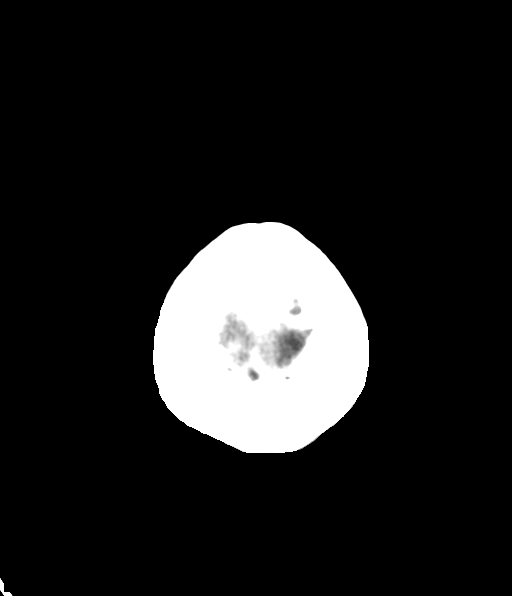

[Series 4: coronal soft tissue · coronal · 0.29mm/px · 3 of 71 slices shown]
[im 24/71  brain]
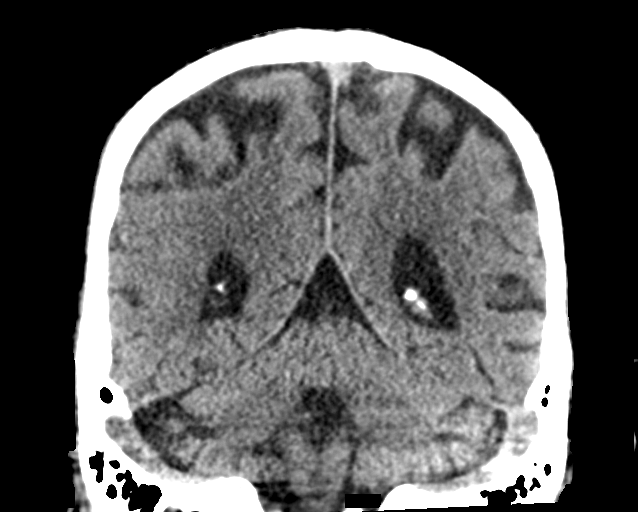
[im 32/71  brain]
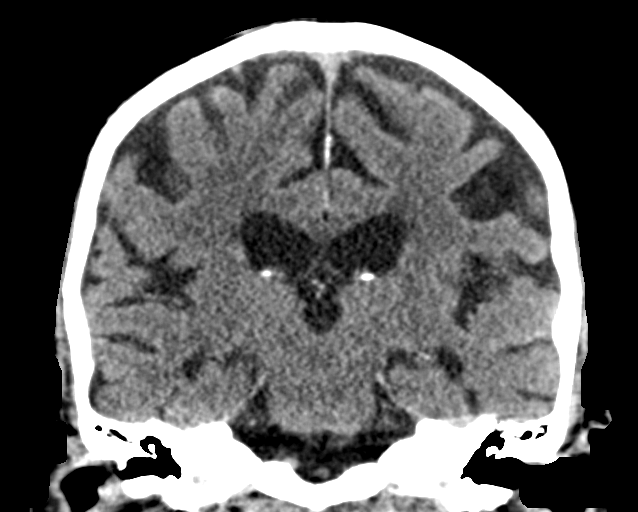
[im 39/71  brain]
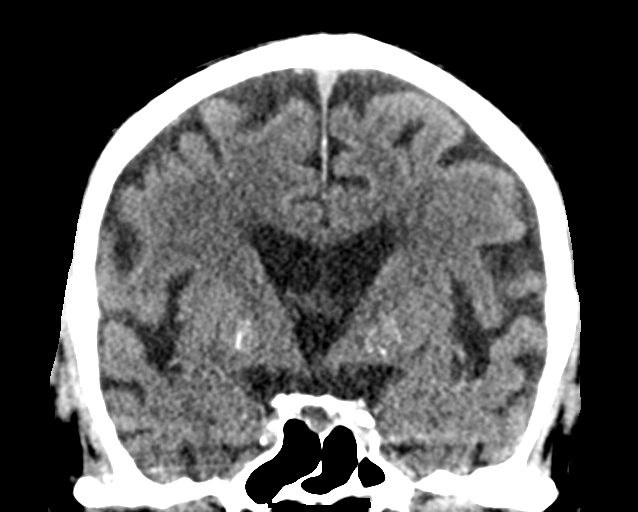

[Series 5: sagittal soft tissue · sagittal · 0.29mm/px · 3 of 58 slices shown]
[im 20/58  brain]
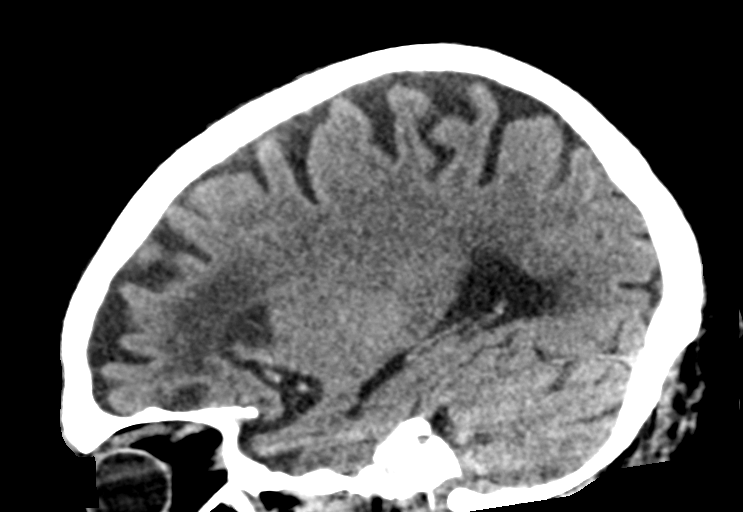
[im 29/58  brain]
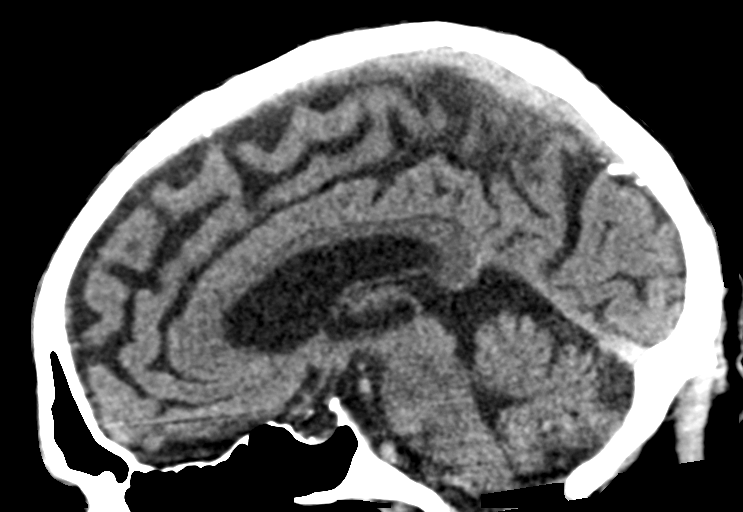
[im 39/58  brain]
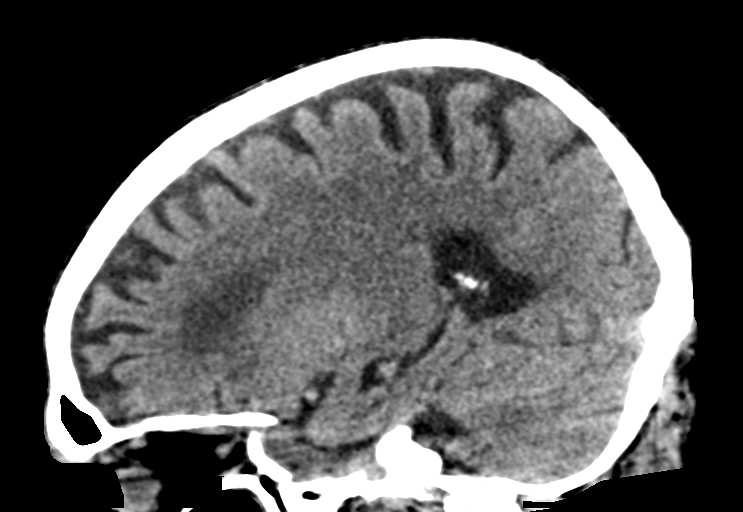

[14 of 47 positions shown; findings below may reference images not displayed]

FINDINGS: Brain: Moderate atrophy, stable. Asymmetric white matter hypodensity
in the left frontal lobe is stable. Probable chronic ischemia.
Additional areas of chronic ischemia in the white matter bilaterally

Negative for acute cortical infarct, hemorrhage, mass.

Vascular: Atherosclerotic calcification cavernous carotid and middle
cerebral arteries bilaterally. Negative for hyperdense vessel.

Skull: Negative

Sinuses/Orbits: Negative

Other: None
IMPRESSION: No acute abnormality and no change since 04/20/2019

Moderate atrophy with chronic ischemic changes. White matter
ischemia asymmetric in the left frontal lobe is unchanged from the
prior study. Tanu Oxendine
